# Patient Record
Sex: Male | Born: 1945 | Race: Asian | Hispanic: No | Marital: Married | Smoking: Former smoker
Health system: Southern US, Community
[De-identification: ages and names within clinical notes are randomized; demographics above are authoritative.]

## PROBLEM LIST (undated history)

## (undated) DIAGNOSIS — N390 Urinary tract infection, site not specified: Secondary | ICD-10-CM

## (undated) DIAGNOSIS — E119 Type 2 diabetes mellitus without complications: Secondary | ICD-10-CM

## (undated) DIAGNOSIS — I10 Essential (primary) hypertension: Secondary | ICD-10-CM

## (undated) DIAGNOSIS — E785 Hyperlipidemia, unspecified: Secondary | ICD-10-CM

## (undated) DIAGNOSIS — N400 Enlarged prostate without lower urinary tract symptoms: Secondary | ICD-10-CM

## (undated) HISTORY — DX: Essential (primary) hypertension: I10

## (undated) HISTORY — PX: OTHER SURGICAL HISTORY: SHX169

## (undated) HISTORY — PX: HERNIA REPAIR: SHX51

## (undated) HISTORY — DX: Urinary tract infection, site not specified: N39.0

## (undated) HISTORY — DX: Benign prostatic hyperplasia without lower urinary tract symptoms: N40.0

## (undated) HISTORY — DX: Hyperlipidemia, unspecified: E78.5

## (undated) HISTORY — DX: Type 2 diabetes mellitus without complications: E11.9

## (undated) SURGERY — ERCP, WITH INTERVENTION IF INDICATED
Anesthesia: Monitor Anesthesia Care | Laterality: Left

---

## 1999-12-24 ENCOUNTER — Inpatient Hospital Stay (HOSPITAL_COMMUNITY): Admission: EM | Admit: 1999-12-24 | Discharge: 1999-12-25 | Payer: Self-pay

## 2000-04-03 ENCOUNTER — Ambulatory Visit (HOSPITAL_COMMUNITY): Admission: RE | Admit: 2000-04-03 | Discharge: 2000-04-03 | Payer: Self-pay | Admitting: Gastroenterology

## 2005-02-24 ENCOUNTER — Encounter: Admission: RE | Admit: 2005-02-24 | Discharge: 2005-02-24 | Payer: Self-pay | Admitting: Emergency Medicine

## 2008-05-26 ENCOUNTER — Emergency Department (HOSPITAL_COMMUNITY): Admission: EM | Admit: 2008-05-26 | Discharge: 2008-05-27 | Payer: Self-pay | Admitting: Emergency Medicine

## 2010-05-28 NOTE — Procedures (Signed)
[facility]  Patient:    Kenneth Little, Kenneth Little                              MRN: [id number] Proc. Date: 04/03/00 Adm. Date:  [id number] Attending:  Orland Mustard CC:         Oley Balm. Georgina Pillion, M.D.  [facility]   Procedure Report  PROCEDURE:  Colonoscopy.  ENDOSCOPIST:  Llana Aliment. Edwards, M.D.  MEDICATIONS:  Fentanyl 75 mcg, Versed 6 mg IV.  SCOPE:  Olympus adult video colonoscope.  INDICATIONS:  Rectal bleeding with a transfusion and apparently some injection of hemorrhoids.  DESCRIPTION OF PROCEDURE:  The procedure had been explained to the patient and consent obtained.  With the patient in the left lateral decubitus position, the Olympus adult video colonoscope was inserted and advanced under direct visualization.  The prep was quite good.  We were able to advance to the terminal ileum without difficulty.  The ileocecal valve as seen.  The terminal ileum was normal.   The scope was withdrawn.  There was pan diverticulosis throughout the entire colon including the right colon with scattered small diverticulum.  No large amount of diverticulum, no stricture, etc.  The scope was withdrawn into rectum.  Fairly large internal hemorrhoids were seen.  They were not actively bleeding.  ASSESSMENT: 1. Large internal hemorrhoids probably the source of rectal bleeding. 2. Pan diverticulosis.  PLAN:  We will given the hemorrhoid sheet and have the patient take fiber diet, etc., and call if bleeding continues.  The patient may need to have more injection therapy for his hemorrhoids. DD:  04/03/00 TD:  04/04/00 Job: [id number]

## 2011-10-20 ENCOUNTER — Ambulatory Visit: Payer: Self-pay | Admitting: Internal Medicine

## 2011-11-21 ENCOUNTER — Ambulatory Visit (INDEPENDENT_AMBULATORY_CARE_PROVIDER_SITE_OTHER): Payer: 59 | Admitting: Internal Medicine

## 2011-11-21 ENCOUNTER — Encounter: Payer: Self-pay | Admitting: Internal Medicine

## 2011-11-21 VITALS — BP 110/76 | HR 79 | Temp 97.8°F | Resp 16 | Ht 67.5 in | Wt 176.0 lb

## 2011-11-21 DIAGNOSIS — Z860100 Personal history of colon polyps, unspecified: Secondary | ICD-10-CM | POA: Insufficient documentation

## 2011-11-21 DIAGNOSIS — R972 Elevated prostate specific antigen [PSA]: Secondary | ICD-10-CM

## 2011-11-21 DIAGNOSIS — E119 Type 2 diabetes mellitus without complications: Secondary | ICD-10-CM

## 2011-11-21 DIAGNOSIS — I10 Essential (primary) hypertension: Secondary | ICD-10-CM

## 2011-11-21 DIAGNOSIS — E7800 Pure hypercholesterolemia, unspecified: Secondary | ICD-10-CM

## 2011-11-21 DIAGNOSIS — N400 Enlarged prostate without lower urinary tract symptoms: Secondary | ICD-10-CM | POA: Insufficient documentation

## 2011-11-21 DIAGNOSIS — Z0000 Encounter for general adult medical examination without abnormal findings: Secondary | ICD-10-CM

## 2011-11-21 NOTE — Progress Notes (Signed)
Subjective:    Patient ID: Kenneth Little, male    DOB: May 26, 1945, 66 y.o.   MRN: [id number]  HPI  66 year old patient who is seen today to establish with our practice. He is followed by endocrine for a type 2 diabetes which she has had for at least 15 years. He is on Lantus insulin as well as oral medications. He has treated hypertension and is also on atorvastatin for dyslipidemia. He is seen annually for eye examinations by Blima Ledger. His last colonoscopy was 3 years ago. He has a history of colonic polyps. He's also followed Dr. Annabell Howells for BPH and a history of elevated PSA. He has had a prostate biopsy in the past.   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 Nonsmoker. He does exercise regularly including [facility] [employer] twice [facility]  Past Medical History  Diagnosis Date  . Hypertension   . Hyperlipidemia   . UTI (lower urinary tract infection)   . Diabetes mellitus without complication     History   Social History  . Marital Status: Single    Spouse Name: N/A    Number of Children: N/A  . Years of Education: N/A   Occupational History  . Not on file.   Social History Main Topics  . Smoking status: Former Smoker    Types: Cigarettes    Quit date: 11/20/1988  . Smokeless tobacco: Not on file  . Alcohol Use: No  . Drug Use: No  . Sexually Active: Yes   Other Topics Concern  . Not on file   Social History Narrative  . No narrative on file    History reviewed. No pertinent past surgical history.  Family History  Problem Relation Age of Onset  . Cancer Mother     breast  . Hypertension Father   . Diabetes Brother     Not on File  Current Outpatient Prescriptions on File Prior to Visit  Medication Sig Dispense Refill  . atorvastatin (LIPITOR) 40 MG tablet Take 40 mg by mouth daily.      Marland Kitchen DIOVAN 320 MG tablet       . glipiZIDE (GLUCOTROL XL) 10 MG 24 hr tablet Take 10 mg by mouth daily.      .  indapamide (LOZOL) 2.5 MG tablet Take 2.5 mg by mouth every morning.      Marland Kitchen JANUVIA 100 MG tablet       . LANTUS 100 UNIT/ML injection       . metformin (FORTAMET) 1000 MG (OSM) 24 hr tablet Take 1,000 mg by mouth daily with breakfast.      . miglitol (GLYSET) 25 MG tablet Take 25 mg by mouth 3 (three) times daily with meals.      . pioglitazone (ACTOS) 45 MG tablet         BP 110/76  Pulse 79  Temp 97.8 F (36.6 C) (Oral)  Resp 16  Ht 5' 7.5" (1.715 m)  Wt 176 lb (79.833 kg)  BMI 27.16 kg/m2  SpO2 97%       Review of Systems  Constitutional: Negative for fever, chills, activity change, appetite change and fatigue.  HENT: Negative for hearing loss, ear pain, congestion, rhinorrhea, sneezing, mouth sores, trouble swallowing, neck pain, neck stiffness, dental problem, voice change, sinus pressure and tinnitus.   Eyes: Negative for photophobia, pain, redness and visual disturbance.  Respiratory: Negative for apnea, cough, choking, chest tightness,  shortness of breath and wheezing.   Cardiovascular: Negative for chest pain, palpitations and leg swelling.  Gastrointestinal: Negative for nausea, vomiting, abdominal pain, diarrhea, constipation, blood in stool, abdominal distention, anal bleeding and rectal pain.  Genitourinary: Negative for dysuria, urgency, frequency, hematuria, flank pain, decreased urine volume, discharge, penile swelling, scrotal swelling, difficulty urinating, genital sores and testicular pain.  Musculoskeletal: Negative for myalgias, back pain, joint swelling, arthralgias and gait problem.  Skin: Negative for color change, rash and wound.  Neurological: Negative for dizziness, tremors, seizures, syncope, facial asymmetry, speech difficulty, weakness, light-headedness, numbness and headaches.  Hematological: Negative for adenopathy. Does not bruise/bleed easily.  Psychiatric/Behavioral: Negative for suicidal ideas, hallucinations, behavioral problems, confusion,  sleep disturbance, self-injury, dysphoric mood, decreased concentration and agitation. The patient is not nervous/anxious.        Objective:   Physical Exam  Constitutional: He is oriented to person, place, and time. He appears well-developed.  HENT:  Head: Normocephalic.  Right Ear: External ear normal.  Left Ear: External ear normal.  Eyes: Conjunctivae normal and EOM are normal.  Neck: Normal range of motion.  Cardiovascular: Normal rate and normal heart sounds.   Pulmonary/Chest: Breath sounds normal.  Abdominal: Bowel sounds are normal.  Musculoskeletal: Normal range of motion. He exhibits no edema and no tenderness.  Neurological: He is alert and oriented to person, place, and time.  Psychiatric: He has a normal mood and affect. His behavior is normal.          Assessment & Plan:   Preventive health examination Diabetes mellitus. Followup endocrinology Hypertension stable Dyslipidemia History colonic polyps BPH with history of elevated PSA. Followup urology  Recheck 1 year

## 2011-11-21 NOTE — Patient Instructions (Addendum)
Limit your sodium (Salt) intake    It is important that you exercise regularly, at least 20 minutes 3 to 4 times per week.  If you develop chest pain or shortness of breath seek  medical attention.  You need to lose weight.  Consider a lower calorie diet and regular exercise.  Return in one year for follow-up    Please check your hemoglobin A1c every 3 months

## 2012-03-07 ENCOUNTER — Encounter (INDEPENDENT_AMBULATORY_CARE_PROVIDER_SITE_OTHER): Payer: Self-pay | Admitting: General Surgery

## 2012-03-07 ENCOUNTER — Ambulatory Visit (INDEPENDENT_AMBULATORY_CARE_PROVIDER_SITE_OTHER): Payer: 59 | Admitting: General Surgery

## 2012-03-07 VITALS — BP 120/66 | HR 64 | Temp 97.0°F | Resp 12 | Ht 67.0 in | Wt 177.2 lb

## 2012-03-07 DIAGNOSIS — K648 Other hemorrhoids: Secondary | ICD-10-CM | POA: Insufficient documentation

## 2012-03-07 MED ORDER — TAMSULOSIN HCL 0.4 MG PO CAPS: 0.4000 mg | ORAL_CAPSULE | Freq: Every day | ORAL | Status: DC

## 2012-03-07 NOTE — Patient Instructions (Addendum)
No aspirin or blood thinning products 5 days prior to surgery. Use one fleets enema per rectum the morning of surgery at least 2 hours prior to leaving the house.   HEMORRHOIDS   The rectum is the last few inches of your colon, and it naturally stretches to hold stool.  Hemorrhoidal piles are natural clusters of blood vessels that help the rectum stretch to hold stool and allow bowel movements to eliminate feces.  Hemorrhoids are abnormally swollen blood vessels in the rectum.  Too much pressure in the rectum causes hemorrhoids by forcing blood to stretch and bulge the walls of the veins, sometimes even rupturing them.  Hemorrhoids can become like varicose veins you might see on a person's legs. When bulging hemorrhoidal veins are irritated, they can swell, burn, itch, become very painful, and bleed. Once the rectal veins have been stretched out and hemorrhoids created, they are difficult to get rid of completely and tend to recur with less straining than it took to cause them in the first place. Fortunately, good habits and simple medical treatment usually control hemorrhoids well, and surgery is only recommended in unusually severe cases. Some of the most frequent causes of hemorrhoids:    Constant sitting    Straining with bowel movements (from constipation or hard stools)    Diarrhea    Sitting on the toilet for a long time    Severe coughing    Childbirth    Heavy Lifting  Types of Hemorrhoids:    Internal hemorrhoids usually don't hurt or itch; they are deep inside the rectum and usually have no sensation. However, internal hemorrhoids can bleed.  Such bleeding should not be ignored and mask blood from a dangerous source like colorectal cancer, so persistent rectal bleeding should be investigated with a colonoscopy.    External hemorrhoids cause most of the symptoms - pain, burning, and itching. Unirritated hemorrhoids can look like small skin tags coming out of the anus.     Thrombosed  hemorrhoids can form when a hemorrhoid blood vessel bursts and causes the hemorrhoid to swell.  A purple blood clot can form in it and become an excruciatingly painful lump at the anus. Because of these unpleasant symptoms, immediate incision and drainage by a surgeon at an office visit can provide much relief of the pain.    PREVENTION Avoiding the causes listed in above will prevent most cases of hemorrhoids, but this advice is sometimes hard to follow:  How can you avoid sitting all day if you have a seated job? Also, we try to avoid coughing and diarrhea, but sometimes it's beyond your control.  Still, there are some practical hints to help:    If your main job activity is seated, always stand or walk during your breaks. Make it a point to stand and walk at least 5 minutes every hour and try to shift frequently in your chair to avoid direct rectal pressure.    Always exhale as you strain or lift. Don't hold your breath.    Treat coughing, diarrhea and constipation early since irritated hemorrhoids may soon follow.    Do not delay or try to prevent a bowel movement when the urge is present.   Exercise regularly (walking or jogging 60 minutes a day) to stimulate the bowels to move.   Avoid dry toilet paper when cleaning after bowel movements.  Moistened tissues such as baby wipes are less irritating.  Lightly pat the rectal area dry.  Using irrigating showers or  bottle irrigation washing can more gently clean this sensitive area.   Keep the anal and genital area clean and  dry.  Talcum or baby powders can help   GET YOUR STOOLS SOFT.   This is the most important way to prevent irritated hemorrhoids.  Hard stools are like sandpaper to the anorectal canal and will cause more problems.   The goal: ONE SOFT BOWEL MOVEMENT A DAY!  To have soft, regular bowel movements:    Drink at least 8 tall glasses of water a day.     AVOID CONSTIPATION    Take plenty of fiber.  Fiber is the undigested part of plant  food that passes into the colon, acting s "natures broom" to encourage bowel motility and movement.  Fiber can absorb and hold large amounts of water. This results in a larger, bulkier stool, which is soft and easier to pass. Work gradually over several weeks up to 6 servings a day of fiber (25g a day even more if needed) in the form of: o Vegetables -- Root (potatoes, carrots, turnips), leafy green (lettuce, salad greens, celery, spinach), or cooked high residue (cabbage, broccoli, etc) o Fruit -- Fresh (unpeeled skin & pulp), Dried (prunes, apricots, cherries, etc ),  or stewed ( applesauce)  o Whole grain breads, pasta, etc (whole wheat)  o Bran cereals    Bulking Agents -- This type of water-retaining fiber generally is easily obtained each day by one of the following:  o Psyllium bran -- The psyllium plant is remarkable because its ground seeds can retain so much water. This product is available as Metamucil, Konsyl, Effersyllium, Per Diem Fiber, or the less expensive generic preparation in drug and health food stores. Although labeled a laxative, it really is not a laxative.  o Methylcellulose -- This is another fiber derived from wood which also retains water. It is available as Citrucel. o Polyethylene Glycol - and "artificial" fiber commonly called Miralax or Glycolax.  It is helpful for people with gassy or bloated feelings with regular fiber o Flax Seed - a less gassy fiber than psyllium   No reading or other relaxing activity while on the toilet. If bowel movements take longer than 5 minutes, you are too constipated   Laxatives can be useful for a short period if constipation is severe o Osmotics (Milk of Magnesia, Fleets phosphosoda, Magnesium citrate, MiraLax, GoLytely) are safer than  o Stimulants (Senokot, Castor Oil, Dulcolax, Ex Lax)    o Do not take laxatives for more than 7days in a row.   Laxatives are not a good long-term solution as it can stress the intestine and colon and  causes too much mineral and fluid losses.    If badly constipated, try a Bowel Retraining Program: o Do not use laxatives.  o Eat a diet high in roughage, such as bran cereals and leafy vegetables.  o Drink six (6) ounces of prune or apricot juice each morning.  o Eat two (2) large servings of stewed fruit each day.  o Take one (1) heaping dose of a bulking agent (ex. Metamucil, Citrucel, Miralax) twice a day.  o Use sugar-free sweetener when possible to avoid excessive calories.  o Eat a normal breakfast.  o Set aside 15 minutes after breakfast to sit on the toilet, but do not strain to have a bowel movement.  o If you do not have a bowel movement by the third day, use an enema and repeat the above steps.  AVOID DIARRHEA o Switch to liquids and simpler foods for a few days to avoid stressing your intestines further. o Avoid dairy products (especially milk & ice cream) for a short time.  The intestines often can lose the ability to digest lactose when stressed. o Avoid foods that cause gassiness or bloating.  Typical foods include beans and other legumes, cabbage, broccoli, and dairy foods.  Every person has some sensitivity to other foods, so listen to our body and avoid those foods that trigger problems for you. o Adding fiber (Citrucel, Metamucil, psyllium, Miralax) gradually can help thicken stools by absorbing excess fluid and retrain the intestines to act more normally.  Slowly increase the dose over a few weeks.  Too much fiber too soon can backfire and cause cramping & bloating. o Probiotics (such as active yogurt, Align, etc) may help repopulate the intestines and colon with normal bacteria and calm down a sensitive digestive tract.  Most studies show it to be of mild help, though, and such products can be costly. o Medicines:   Bismuth subsalicylate (ex. Kayopectate, Pepto Bismol) every 30 minutes for up to 6 doses can help control diarrhea.  Avoid if pregnant.   Loperamide (Immodium)  can slow down diarrhea.  Start with two tablets (4mg  total) first and then try one tablet every 6 hours.  Avoid if you are having fevers or severe pain.  If you are not better or start feeling worse, stop all medicines and call your doctor for advice o Call your doctor if you are getting worse or not better.  Sometimes further testing (cultures, endoscopy, X-ray studies, bloodwork, etc) may be needed to help diagnose and treat the cause of the diarrhea.   If these preventive measures fail, you must take action right away! Hemorrhoids are one condition that can be mild in the morning and become intolerable by nightfall.

## 2012-03-07 NOTE — Progress Notes (Signed)
Patient ID: Kenneth Little, male   DOB: August 22, 1945, 67 y.o.   MRN: [id number]  Chief Complaint  Patient presents with  . New Evaluation    eval hems    HPI Kenneth Little is a 67 y.o. male.   HPI 67 year old Asian male comes in complaining of bleeding hemorrhoids. He is a former patient of Dr. Jinny Sanders. He states he hasn't had problems for a long time. He states that he started having bleeding after having bowel movements about 10 days. He states the blood is mainly in the commode. It is bright red. He denies any hemorrhoids protruding out. He denies any pain with defecation. He has 2 bowel movements a day. He denies any straining. He does not sit and read on the commode. He denies any itching or burning. He states that he drinks multiple glasses of water a day. He also has a diet that is high in fiber with fruits and vegetables. He denies any incontinence. He states that he has had several hemorrhoidal procedures in the past but they were done in the office. It sounds like he had sclerotherapy in the office. He said he had one procedure done in the office where he started bleeding quite profusely and had to be transferred and placed in the hospital and receive a blood transfusion. He has a colonoscopy every 5 years. His last one was 2-3 years ago he states. He says he just has benign polyps. He denies any family history of colon cancer.  Past Medical History  Diagnosis Date  . Hypertension   . Hyperlipidemia   . UTI (lower urinary tract infection)   . Diabetes mellitus without complication     History reviewed. No pertinent past surgical history.  Family History  Problem Relation Age of Onset  . Cancer Mother     breast  . Hypertension Father   . Diabetes Brother     Social History History  Substance Use Topics  . Smoking status: Former Smoker    Types: Cigarettes    Quit date: 11/20/1988  . Smokeless tobacco: Not on file  . Alcohol Use: No    No Known Allergies  Current Outpatient  Prescriptions  Medication Sig Dispense Refill  . aspirin 81 MG tablet Take 81 mg by mouth daily.      Marland Kitchen atorvastatin (LIPITOR) 40 MG tablet Take 40 mg by mouth daily.      Marland Kitchen DIOVAN 320 MG tablet       . ergocalciferol (VITAMIN D2) 50000 UNITS capsule Take 50,000 Units by mouth once a week.      Marland Kitchen glipiZIDE (GLUCOTROL XL) 10 MG 24 hr tablet Take 10 mg by mouth daily.      . indapamide (LOZOL) 2.5 MG tablet Take 2.5 mg by mouth every morning.      . INVOKANA 300 MG TABS       . JANUVIA 100 MG tablet       . LANTUS 100 UNIT/ML injection       . metformin (FORTAMET) 1000 MG (OSM) 24 hr tablet Take 1,000 mg by mouth daily with breakfast.      . miglitol (GLYSET) 25 MG tablet Take 25 mg by mouth 3 (three) times daily with meals.      . ONE TOUCH ULTRA TEST test strip       . pioglitazone (ACTOS) 45 MG tablet       . vitamin E 400 UNIT capsule Take 400 Units by mouth daily.      Marland Kitchen  tamsulosin (FLOMAX) 0.4 MG CAPS Take 1 capsule (0.4 mg total) by mouth daily. Start taking 2 days before hemorrhoid surgery  7 capsule  0   No current facility-administered medications for this visit.    Review of Systems Review of Systems  Constitutional: Negative for fever, chills, appetite change and unexpected weight change.       Plays basketball twice a week  HENT: Negative for congestion and trouble swallowing.   Eyes: Negative for visual disturbance.  Respiratory: Negative for chest tightness and shortness of breath.   Cardiovascular: Negative for chest pain and leg swelling.       No PND, no orthopnea, no DOE  Gastrointestinal:       See HPI  Genitourinary: Negative for dysuria and hematuria.       Some trouble starting stream. Some nocturia. Sees Dr Annabell Howells once a year for prostate  Musculoskeletal: Negative.   Skin: Negative for rash.  Neurological: Negative for seizures and speech difficulty.  Hematological: Does not bruise/bleed easily.  Psychiatric/Behavioral: Negative for behavioral problems and  confusion.    Blood pressure 120/66, pulse 64, temperature 97 F (36.1 C), temperature source Temporal, resp. rate 12, height 5\' 7"  (1.702 m), weight 177 lb 3.2 oz (80.377 kg).  Physical Exam Physical Exam  Vitals reviewed. Constitutional: He is oriented to person, place, and time. He appears well-developed and well-nourished. No distress.  HENT:  Head: Normocephalic and atraumatic.  Right Ear: External ear normal.  Left Ear: External ear normal.  Eyes: Conjunctivae are normal. No scleral icterus.  Neck: Neck supple. No tracheal deviation present. No thyromegaly present.  Cardiovascular: Normal rate, regular rhythm and normal heart sounds.   Pulmonary/Chest: Effort normal and breath sounds normal. No respiratory distress. He has no wheezes. He has no rales.  Abdominal: Soft. He exhibits no distension. There is no tenderness. There is no rebound and no guarding.  Genitourinary: Rectal exam shows no fissure and anal tone normal.     Old rt posterolateral incision,. Small left ant ext hemorrhoid - not engorged. A little prostate hypertrophy but no masses. Anoscopy showed large rt ant and post internal hemorrhoid; small left lateral int hemorrhoid  Musculoskeletal: He exhibits no edema and no tenderness.  Neurological: He is alert and oriented to person, place, and time.  Skin: Skin is warm and dry. No rash noted. He is not diaphoretic. No erythema. No pallor.  Psychiatric: He has a normal mood and affect. His behavior is normal. Judgment and thought content normal.    Data Reviewed Dr Charm Rings note  Assessment    Bleeding 3 column internal hemorrhoids DM HTN     Plan    We discussed the etiology of hemorrhoids. The patient was given educational material as well as diagrams. We discussed nonoperative and operative management of hemorrhoidal disease.  We discussed the importance of having a daily soft bowel movement and avoiding constipation. We also discussed good bowel habits such  as not reading in the bathroom, not straining, and drinking 6-8 glasses of water per day. We also discussed the importance of a high fiber diet. We discussed foods that were high in fiber as well as fiber supplements. We discussed the importance of trying to get 25-30 g of fiber per day in their diet. We discussed the need to start with a low dose of fiber and then gradually increasing their daily fiber dose over several weeks in order to avoid bloating and cramping.  We then discussed different surgical techniques for hemorrhoids,  specifically hemorrhoidal banding and excisional hemorrhoidectomy.  PLAN: Since the patient has excellent bowel habits, we discussed proceeding to the operating room for an exam under anesthesia, excisional hemorrhoidectomy and possible hemorrhoidal banding. It appears that only one hemorrhoid may be amenable to banding. Given the size of his internal hemorrhoids I do not believe sclerotherapy will be of much benefit  I discussed the procedure in detail.  The patient was given Agricultural engineer.  We discussed the risks and benefits of surgery including, but not limited to bleeding, infection, blood clot formation, anesthesia risk, urinary retention, hemorrhoid recurrence, injury to the sphincters resulting in incontinence, and the rare possibility of anal canal narrowing. I explained that the likelihood of improvement of their symptoms is good  We discussed the typical postoperative course.  I stressed the importance of not becoming constipated after surgery.  The patient was encouraged to limit pain medication if possible as this increases the likelihood of becoming constipated. The patient was advised to take stool softners & drink 8-10 glasses of non-carbonated, non-alcoholic beverages per day and to eat a high fiber diet.  I also encouraged soaking in a water warm bath for 15 minutes at a time several times a day and after a bowel movement.  The patient was advised to take  laxatives such as milk of magnesia or Miralax if no bowel movement three days after surgery.  The patient was advised to expect some blood tinged drainage as well as some blood in their bowel movements.    He would like to schedule surgery as soon as possible.  He will be given a fleets enema the morning of surgery. I will also place him on perioperative Flomax to decrease his risk of postoperative urinary retention  Mary Sella. Andrey Campanile, MD, FACS General, Bariatric, & Minimally Invasive Surgery [facility], [place]        [facility] M 03/07/2012, 10:19 AM

## 2012-03-20 ENCOUNTER — Other Ambulatory Visit (INDEPENDENT_AMBULATORY_CARE_PROVIDER_SITE_OTHER): Payer: Self-pay | Admitting: General Surgery

## 2012-03-20 DIAGNOSIS — K644 Residual hemorrhoidal skin tags: Secondary | ICD-10-CM

## 2012-03-20 DIAGNOSIS — K648 Other hemorrhoids: Secondary | ICD-10-CM

## 2012-03-20 HISTORY — PX: HEMORRHOID SURGERY: SHX153

## 2012-03-24 ENCOUNTER — Emergency Department (HOSPITAL_COMMUNITY)
Admission: EM | Admit: 2012-03-24 | Discharge: 2012-03-25 | Disposition: A | Discharge status: Home / Self Care | Payer: 59 | Attending: Emergency Medicine | Admitting: Emergency Medicine

## 2012-03-24 DIAGNOSIS — Z79899 Other long term (current) drug therapy: Secondary | ICD-10-CM | POA: Insufficient documentation

## 2012-03-24 DIAGNOSIS — Z87891 Personal history of nicotine dependence: Secondary | ICD-10-CM | POA: Insufficient documentation

## 2012-03-24 DIAGNOSIS — R5082 Postprocedural fever: Secondary | ICD-10-CM | POA: Insufficient documentation

## 2012-03-24 DIAGNOSIS — I10 Essential (primary) hypertension: Secondary | ICD-10-CM | POA: Insufficient documentation

## 2012-03-24 DIAGNOSIS — E785 Hyperlipidemia, unspecified: Secondary | ICD-10-CM | POA: Insufficient documentation

## 2012-03-24 DIAGNOSIS — R05 Cough: Secondary | ICD-10-CM | POA: Insufficient documentation

## 2012-03-24 DIAGNOSIS — R509 Fever, unspecified: Secondary | ICD-10-CM

## 2012-03-24 DIAGNOSIS — E119 Type 2 diabetes mellitus without complications: Secondary | ICD-10-CM | POA: Insufficient documentation

## 2012-03-24 DIAGNOSIS — Z9889 Other specified postprocedural states: Secondary | ICD-10-CM | POA: Insufficient documentation

## 2012-03-24 DIAGNOSIS — R059 Cough, unspecified: Secondary | ICD-10-CM | POA: Insufficient documentation

## 2012-03-24 DIAGNOSIS — Z7982 Long term (current) use of aspirin: Secondary | ICD-10-CM | POA: Insufficient documentation

## 2012-03-24 DIAGNOSIS — Z87440 Personal history of urinary (tract) infections: Secondary | ICD-10-CM | POA: Insufficient documentation

## 2012-03-25 ENCOUNTER — Emergency Department (HOSPITAL_COMMUNITY): Payer: 59

## 2012-03-25 ENCOUNTER — Telehealth (INDEPENDENT_AMBULATORY_CARE_PROVIDER_SITE_OTHER): Payer: Self-pay | Admitting: General Surgery

## 2012-03-25 ENCOUNTER — Encounter (HOSPITAL_COMMUNITY): Payer: Self-pay | Admitting: Emergency Medicine

## 2012-03-25 LAB — COMPREHENSIVE METABOLIC PANEL
AST: 63 U/L — ABNORMAL HIGH (ref 0–37)
Albumin: 3 g/dL — ABNORMAL LOW (ref 3.5–5.2)
Alkaline Phosphatase: 157 U/L — ABNORMAL HIGH (ref 39–117)
GFR calc Af Amer: 81 mL/min — ABNORMAL LOW (ref >90)
GFR calc non Af Amer: 70 mL/min — ABNORMAL LOW (ref >90)
Glucose, Bld: 229 mg/dL — ABNORMAL HIGH (ref 70–99)
Potassium: 3.3 mEq/L — ABNORMAL LOW (ref 3.5–5.1)
Total Protein: 6 g/dL (ref 6.0–8.3)

## 2012-03-25 LAB — URINE MICROSCOPIC-ADD ON: Urine-Other: NONE SEEN

## 2012-03-25 LAB — CBC
HCT: 37.6 % — ABNORMAL LOW (ref 39.0–52.0)
Hemoglobin: 12.9 g/dL — ABNORMAL LOW (ref 13.0–17.0)
MCH: 30.9 pg (ref 26.0–34.0)
MCHC: 34.3 g/dL (ref 30.0–36.0)
MCV: 90 fL (ref 78.0–100.0)
Platelets: 148 10*3/uL — ABNORMAL LOW (ref 150–400)
RBC: 4.18 MIL/uL — ABNORMAL LOW (ref 4.22–5.81)
RDW: 13 % (ref 11.5–15.5)
WBC: 6 10*3/uL (ref 4.0–10.5)

## 2012-03-25 LAB — URINALYSIS, ROUTINE W REFLEX MICROSCOPIC
Hgb urine dipstick: NEGATIVE
Leukocytes, UA: NEGATIVE
Specific Gravity, Urine: 1.039 — ABNORMAL HIGH (ref 1.005–1.030)

## 2012-03-25 MED ORDER — IBUPROFEN 200 MG PO TABS
600.0000 mg | ORAL_TABLET | Freq: Once | ORAL | Status: AC
  Administered 2012-03-25: 600 mg via ORAL
  Filled 2012-03-25: qty 3

## 2012-03-25 MED ORDER — SODIUM CHLORIDE 0.9 % IV SOLN
1000.0000 mL | Freq: Once | INTRAVENOUS | Status: AC
  Administered 2012-03-25: 1000 mL via INTRAVENOUS

## 2012-03-25 MED ORDER — IOHEXOL 350 MG/ML SOLN
100.0000 mL | Freq: Once | INTRAVENOUS | Status: AC | PRN
  Administered 2012-03-25: 100 mL via INTRAVENOUS

## 2012-03-25 MED ORDER — SODIUM CHLORIDE 0.9 % IV SOLN
1000.0000 mL | INTRAVENOUS | Status: DC
  Administered 2012-03-25: 1000 mL via INTRAVENOUS

## 2012-03-25 NOTE — ED Notes (Signed)
Patient transported to CT 

## 2012-03-25 NOTE — ED Notes (Signed)
MD at bedside. 

## 2012-03-25 NOTE — ED Notes (Addendum)
Patient post op for internal hemorrhoid surgery by Dr Gaynelle Adu on 03/20/12. No significant events following surgery until today when patient became lightheaded and febrile. At 0500 patient took his 5/325 percocet and colace, at 62 son arrived to patients home and found patient to be febrile. Patient son called surgeon, was told if fever continued to elevate or accompanied by a variety of symptoms to come to ER. At 1200 patient took 500mg  tylenol for temp 102.3 & recheck of 103.2, 1400 101.1, 1530 BM, sitz bath, 100.6, 1600 101.6 5/325 percocet, 1630 500mg  tylenol, 1800 colace pt reports temp 98.6, at 2330 104.2 & 105.5 on recheck. Patient showered, and was given 5/325 percocet and 500mg  Tylenol, FSBS 225. Patient denies taking his HS meds, including htn & dm meds.

## 2012-03-25 NOTE — ED Provider Notes (Signed)
History     CSN: [id number]  Arrival date & time 03/24/12  2357   First MD Initiated Contact with Patient 03/24/12 2342      Chief Complaint  Patient presents with  . Post-op Problem  . Fever    (Consider location/radiation/quality/duration/timing/severity/associated sxs/prior treatment) The history is provided by the patient (The patient's surgeon Dr. Andrey Campanile).   patient is currently 5 days out from a hemorrhoid surgery was performed in an outpatient surgical center by Dr. Andrey Campanile.  I was called by Dr. Andrey Campanile who informed me the patient was coming emergency apartment because of fevers of 204 105 today.  The patient reports mild cough but no shortness of breath.  He said no urinary symptoms.  He denies nausea vomiting and diarrhea.  He has no abdominal pain.  He reports that his perineum feels much better today than it did during the previous 2 days.  He denies new rash.  He has no urinary symptoms.  No melena or hematochezia.  He moved his bowels normally yesterday and a small amount today.  He denies sore throat.  No new rash.  Denies unilateral leg swelling.  No history of DVT or pulmonary embolism.  He is a diabetic reports his blood sugars have been in the mid-200s today.  This is high for this patient.  His family denies altered mental status.  No neck pain or neck stiffness.  No chest pain. Past Medical History  Diagnosis Date  . Hypertension   . Hyperlipidemia   . UTI (lower urinary tract infection)   . Diabetes mellitus without complication     Past Surgical History  Procedure Laterality Date  . Hemorrhoid surgery  03/20/12    internal    Family History  Problem Relation Age of Onset  . Cancer Mother     breast  . Hypertension Father   . Diabetes Brother     History  Substance Use Topics  . Smoking status: Former Smoker    Types: Cigarettes    Quit date: 11/20/1988  . Smokeless tobacco: Not on file  . Alcohol Use: No      Review of Systems  All other systems  reviewed and are negative.    Allergies  Review of patient's allergies indicates no known allergies.  Home Medications   Current Outpatient Rx  Name  Route  Sig  Dispense  Refill  . acetaminophen (TYLENOL) 500 MG tablet   Oral   Take 500 mg by mouth every 6 (six) hours as needed for pain. For pain         . aspirin 81 MG tablet   Oral   Take 81 mg by mouth every morning.          Marland Kitchen atorvastatin (LIPITOR) 40 MG tablet   Oral   Take 40 mg by mouth daily.         Marland Kitchen DIOVAN 320 MG tablet   Oral   Take 320 mg by mouth every evening.          . ergocalciferol (VITAMIN D2) 50000 UNITS capsule   Oral   Take 50,000 Units by mouth once a week. Thursday         . glipiZIDE (GLUCOTROL XL) 10 MG 24 hr tablet   Oral   Take 10 mg by mouth daily.         . indapamide (LOZOL) 2.5 MG tablet   Oral   Take 2.5 mg by mouth every morning.         Marland Kitchen  INVOKANA 300 MG TABS   Oral   Take 1 capsule by mouth daily.          Marland Kitchen JANUVIA 100 MG tablet   Oral   Take 100 mg by mouth daily.          Marland Kitchen LANTUS 100 UNIT/ML injection   Subcutaneous   Inject 20 Units into the skin every evening.          . metformin (FORTAMET) 1000 MG (OSM) 24 hr tablet   Oral   Take 1,000 mg by mouth daily with breakfast.         . oxyCODONE-acetaminophen (PERCOCET/ROXICET) 5-325 MG per tablet   Oral   Take 1 tablet by mouth every 4 (four) hours as needed for pain. For pain         . pioglitazone (ACTOS) 45 MG tablet   Oral   Take 45 mg by mouth daily.          . tamsulosin (FLOMAX) 0.4 MG CAPS   Oral   Take 1 capsule (0.4 mg total) by mouth daily. Start taking 2 days before hemorrhoid surgery   7 capsule   0   . vitamin E 400 UNIT capsule   Oral   Take 400 Units by mouth daily.           BP 109/50  Pulse 107  Temp(Src) 102.2 F (39 C) (Oral)  Resp 24  SpO2 92%  Physical Exam  Nursing note and vitals reviewed. Constitutional: He is oriented to person, place, and  time. He appears well-developed and well-nourished.  Non-toxic appearance. He does not have a sickly appearance. He does not appear ill.  HENT:  Head: Normocephalic and atraumatic.  Eyes: EOM are normal.  Neck: Normal range of motion.  Full range of motion of his neck.  No neck stiffness.  No meningeal signs.  Cardiovascular: Regular rhythm, normal heart sounds and intact distal pulses.   Tachycardia  Pulmonary/Chest: Effort normal and breath sounds normal. No respiratory distress. He has no rales.  Abdominal: Soft. He exhibits no distension. There is no tenderness.  Genitourinary:  Obvious recent surgical incision of his perineum.  No surrounding erythema.  No drainage.  No fluctuance or tenderness.  No crepitus.  No crepitus of the scrotum.  No drainage  Musculoskeletal: Normal range of motion.  Neurological: He is alert and oriented to person, place, and time.  Skin: Skin is warm and dry. No rash noted.  Psychiatric: He has a normal mood and affect. Judgment normal.    ED Course  Procedures (including critical care time)  Labs Reviewed  CBC - Abnormal; Notable for the following:    RBC 4.18 (*)    Hemoglobin 12.9 (*)    HCT 37.6 (*)    Platelets 148 (*)    All other components within normal limits  COMPREHENSIVE METABOLIC PANEL - Abnormal; Notable for the following:    Sodium 130 (*)    Potassium 3.3 (*)    Chloride 95 (*)    Glucose, Bld 229 (*)    Albumin 3.0 (*)    AST 63 (*)    ALT 86 (*)    Alkaline Phosphatase 157 (*)    GFR calc non Af Amer 70 (*)    GFR calc Af Amer 81 (*)    All other components within normal limits  URINALYSIS, ROUTINE W REFLEX MICROSCOPIC - Abnormal; Notable for the following:    APPearance CLOUDY (*)    Specific Gravity,  Urine 1.039 (*)    Glucose, UA >1000 (*)    All other components within normal limits  CULTURE, BLOOD (ROUTINE X 2)  CULTURE, BLOOD (ROUTINE X 2)  URINE MICROSCOPIC-ADD ON   Dg Chest 2 View  03/25/2012  *RADIOLOGY  REPORT*  Clinical Data: Postop 5 days.  Fever.  CHEST - 2 VIEW  Comparison: None.  Findings: Shallow inspiration.  Normal heart size and pulmonary vascularity.  Mild linear atelectasis in the lung bases.  No focal consolidation or airspace disease.  No blunting of costophrenic angles.  No pneumothorax.  Mediastinal contours appear intact. Calcification of the aorta.  IMPRESSION: Shallow inspiration with linear atelectasis in the lung bases.  No focal consolidation.   Original Report Authenticated By: Burman Nieves, M.D.    Ct Angio Chest W/cm &/or Wo Cm  03/25/2012  *RADIOLOGY REPORT*  Clinical Data: Postoperative fever and hypoxia.  CT ANGIOGRAPHY CHEST  Technique:  Multidetector CT imaging of the chest using the standard protocol during bolus administration of intravenous contrast. Multiplanar reconstructed images including MIPs were obtained and reviewed to evaluate the vascular anatomy.  Contrast: OMNIPAQUE IOHEXOL 350 MG/ML SOLN  Comparison: None.  Findings: Technically adequate study with good opacification of the central and segmental pulmonary arteries.  No focal filling defect. No evidence of significant pulmonary embolus.  Normal heart size.  Normal caliber thoracic aorta with calcification.  No significant lymphadenopathy in the chest.  No abnormal mediastinal fluid collections.  The esophagus is mostly decompressed.  Visualized portions of the upper abdominal organs are grossly unremarkable.  Mild atelectasis in the lung bases. Focal linear atelectasis in the right mid lung.  No pneumothorax. Mild emphysematous changes in the upper lungs.  No pleural effusions.  Normal alignment of the thoracic vertebrae.  IMPRESSION: No evidence of significant pulmonary embolus.  Mild atelectasis in the lungs.   Original Report Authenticated By: Burman Nieves, M.D.    I personally reviewed the imaging tests through PACS system I reviewed available ER/hospitalization records through the EMR   1. Fever        MDM  The patient looks her leg.  Initial chest x-ray demonstrated some atelectasis now for CT and it was performed.  CT angiogram no evidence of pulmonary embolism.  No evidence of pneumonia.  Home with an incentive spirometer.  Repeat abdominal exam is benign.  Surgical incisions look great.  Blood cultures are still pending.  The patient is overall well appearing and nontoxic appearing.  He is very reasonable and his family seems to take great care of him.  His family is extremely agitated.  The patient has resources and transportation.  In his family were encouraged to return to the ER for new or worsening symptoms.  I feel the patient is a great candidate for close outpatient observation.        Lyanne Co, MD 03/25/12 [phone]

## 2012-03-25 NOTE — Telephone Encounter (Signed)
Just called to check on pt after ER visit last night/early this am. Workup negative. LM on machine. Will contact tomorrow for phone f/u

## 2012-03-26 ENCOUNTER — Telehealth (INDEPENDENT_AMBULATORY_CARE_PROVIDER_SITE_OTHER): Payer: Self-pay | Admitting: General Surgery

## 2012-03-26 NOTE — Telephone Encounter (Signed)
Called to check on pt given high fevers over the weekend and ED visit. Says he is doing well. No n/v/abd pain/dysuria. No fevers. Temps 97-98. Having BMs. Advised to call if any issues before f/u appt.

## 2012-03-28 ENCOUNTER — Emergency Department (HOSPITAL_COMMUNITY): Payer: 59

## 2012-03-28 ENCOUNTER — Inpatient Hospital Stay (HOSPITAL_COMMUNITY)
Admission: EM | Admit: 2012-03-28 | Discharge: 2012-03-31 | DRG: 872 | Disposition: A | Discharge status: Home Health | Payer: 59 | Attending: Internal Medicine | Admitting: Internal Medicine

## 2012-03-28 ENCOUNTER — Inpatient Hospital Stay (HOSPITAL_COMMUNITY): Payer: 59

## 2012-03-28 ENCOUNTER — Encounter (HOSPITAL_COMMUNITY): Payer: Self-pay | Admitting: Emergency Medicine

## 2012-03-28 ENCOUNTER — Telehealth (INDEPENDENT_AMBULATORY_CARE_PROVIDER_SITE_OTHER): Payer: Self-pay | Admitting: *Deleted

## 2012-03-28 DIAGNOSIS — A419 Sepsis, unspecified organism: Secondary | ICD-10-CM

## 2012-03-28 DIAGNOSIS — Z87891 Personal history of nicotine dependence: Secondary | ICD-10-CM

## 2012-03-28 DIAGNOSIS — R7881 Bacteremia: Secondary | ICD-10-CM

## 2012-03-28 DIAGNOSIS — K759 Inflammatory liver disease, unspecified: Secondary | ICD-10-CM

## 2012-03-28 DIAGNOSIS — K648 Other hemorrhoids: Secondary | ICD-10-CM

## 2012-03-28 DIAGNOSIS — A4151 Sepsis due to Escherichia coli [E. coli]: Principal | ICD-10-CM | POA: Diagnosis present

## 2012-03-28 DIAGNOSIS — N400 Enlarged prostate without lower urinary tract symptoms: Secondary | ICD-10-CM

## 2012-03-28 DIAGNOSIS — R509 Fever, unspecified: Secondary | ICD-10-CM

## 2012-03-28 DIAGNOSIS — E7800 Pure hypercholesterolemia, unspecified: Secondary | ICD-10-CM

## 2012-03-28 DIAGNOSIS — E876 Hypokalemia: Secondary | ICD-10-CM | POA: Diagnosis present

## 2012-03-28 DIAGNOSIS — R7989 Other specified abnormal findings of blood chemistry: Secondary | ICD-10-CM

## 2012-03-28 DIAGNOSIS — E119 Type 2 diabetes mellitus without complications: Secondary | ICD-10-CM

## 2012-03-28 DIAGNOSIS — Z79899 Other long term (current) drug therapy: Secondary | ICD-10-CM

## 2012-03-28 DIAGNOSIS — Z860100 Personal history of colon polyps, unspecified: Secondary | ICD-10-CM

## 2012-03-28 DIAGNOSIS — Z8719 Personal history of other diseases of the digestive system: Secondary | ICD-10-CM

## 2012-03-28 DIAGNOSIS — D72819 Decreased white blood cell count, unspecified: Secondary | ICD-10-CM

## 2012-03-28 DIAGNOSIS — K819 Cholecystitis, unspecified: Secondary | ICD-10-CM | POA: Diagnosis present

## 2012-03-28 DIAGNOSIS — Z7982 Long term (current) use of aspirin: Secondary | ICD-10-CM

## 2012-03-28 DIAGNOSIS — I10 Essential (primary) hypertension: Secondary | ICD-10-CM

## 2012-03-28 DIAGNOSIS — E785 Hyperlipidemia, unspecified: Secondary | ICD-10-CM | POA: Diagnosis present

## 2012-03-28 LAB — BASIC METABOLIC PANEL
CO2: 28 mEq/L (ref 19–32)
Chloride: 95 mEq/L — ABNORMAL LOW (ref 96–112)
Creatinine, Ser: 0.99 mg/dL (ref 0.50–1.35)
Glucose, Bld: 204 mg/dL — ABNORMAL HIGH (ref 70–99)
Sodium: 137 mEq/L (ref 135–145)

## 2012-03-28 LAB — URINALYSIS, ROUTINE W REFLEX MICROSCOPIC
Glucose, UA: >1000 mg/dL — AB
Hgb urine dipstick: NEGATIVE
Leukocytes, UA: NEGATIVE
pH: 6 (ref 5.0–8.0)

## 2012-03-28 LAB — HEPATIC FUNCTION PANEL
AST: 252 U/L — ABNORMAL HIGH (ref 0–37)
Albumin: 3.1 g/dL — ABNORMAL LOW (ref 3.5–5.2)
Total Protein: 6.9 g/dL (ref 6.0–8.3)

## 2012-03-28 LAB — CBC WITH DIFFERENTIAL/PLATELET
Basophils Absolute: 0 10*3/uL (ref 0.0–0.1)
HCT: 38.5 % — ABNORMAL LOW (ref 39.0–52.0)
Lymphocytes Relative: 10 % — ABNORMAL LOW (ref 12–46)
Lymphs Abs: 0.2 10*3/uL — ABNORMAL LOW (ref 0.7–4.0)
MCV: 90.2 fL (ref 78.0–100.0)
Monocytes Absolute: 0 10*3/uL — ABNORMAL LOW (ref 0.1–1.0)
Neutro Abs: 2 10*3/uL (ref 1.7–7.7)
RBC: 4.27 MIL/uL (ref 4.22–5.81)
RDW: 13 % (ref 11.5–15.5)
WBC: 2.2 10*3/uL — ABNORMAL LOW (ref 4.0–10.5)

## 2012-03-28 LAB — GLUCOSE, CAPILLARY: Glucose-Capillary: 240 mg/dL — ABNORMAL HIGH (ref 70–99)

## 2012-03-28 LAB — CG4 I-STAT (LACTIC ACID): Lactic Acid, Venous: 2.64 mmol/L — ABNORMAL HIGH (ref 0.5–2.2)

## 2012-03-28 LAB — URINE MICROSCOPIC-ADD ON

## 2012-03-28 MED ORDER — INSULIN GLARGINE 100 UNIT/ML ~~LOC~~ SOLN
20.0000 [IU] | Freq: Every day | SUBCUTANEOUS | Status: DC
  Administered 2012-03-28 – 2012-03-30 (×3): 20 [IU] via SUBCUTANEOUS
  Filled 2012-03-28 (×4): qty 0.2

## 2012-03-28 MED ORDER — ASPIRIN EC 81 MG PO TBEC
81.0000 mg | DELAYED_RELEASE_TABLET | Freq: Every day | ORAL | Status: DC
  Administered 2012-03-29 – 2012-03-31 (×3): 81 mg via ORAL
  Filled 2012-03-28 (×3): qty 1

## 2012-03-28 MED ORDER — ACETAMINOPHEN 650 MG RE SUPP: 650.0000 mg | Freq: Four times a day (QID) | RECTAL | Status: DC | PRN

## 2012-03-28 MED ORDER — TAMSULOSIN HCL 0.4 MG PO CAPS
0.4000 mg | ORAL_CAPSULE | Freq: Every day | ORAL | Status: DC
  Administered 2012-03-29 – 2012-03-31 (×3): 0.4 mg via ORAL
  Filled 2012-03-28 (×3): qty 1

## 2012-03-28 MED ORDER — GLIPIZIDE ER 10 MG PO TB24
10.0000 mg | ORAL_TABLET | Freq: Every day | ORAL | Status: DC
Start: 2012-03-29 — End: 2012-03-31
  Administered 2012-03-30 – 2012-03-31 (×2): 10 mg via ORAL
  Filled 2012-03-28 (×5): qty 1

## 2012-03-28 MED ORDER — INSULIN ASPART 100 UNIT/ML ~~LOC~~ SOLN
0.0000 [IU] | Freq: Three times a day (TID) | SUBCUTANEOUS | Status: DC
  Administered 2012-03-29: 2 [IU] via SUBCUTANEOUS

## 2012-03-28 MED ORDER — LINAGLIPTIN 5 MG PO TABS
5.0000 mg | ORAL_TABLET | Freq: Every day | ORAL | Status: DC
  Administered 2012-03-31: 5 mg via ORAL
  Filled 2012-03-28 (×3): qty 1

## 2012-03-28 MED ORDER — ONDANSETRON HCL 4 MG/2ML IJ SOLN: 4.0000 mg | Freq: Four times a day (QID) | INTRAMUSCULAR | Status: DC | PRN

## 2012-03-28 MED ORDER — SODIUM CHLORIDE 0.9 % IV BOLUS (SEPSIS)
1000.0000 mL | Freq: Once | INTRAVENOUS | Status: AC
  Administered 2012-03-28: 1000 mL via INTRAVENOUS

## 2012-03-28 MED ORDER — ENOXAPARIN SODIUM 40 MG/0.4ML ~~LOC~~ SOLN
40.0000 mg | Freq: Every day | SUBCUTANEOUS | Status: DC
  Administered 2012-03-28 – 2012-03-30 (×3): 40 mg via SUBCUTANEOUS
  Filled 2012-03-28 (×4): qty 0.4

## 2012-03-28 MED ORDER — VANCOMYCIN HCL IN DEXTROSE 1-5 GM/200ML-% IV SOLN
1000.0000 mg | Freq: Once | INTRAVENOUS | Status: AC
  Administered 2012-03-29: 1000 mg via INTRAVENOUS
  Filled 2012-03-28 (×2): qty 200

## 2012-03-28 MED ORDER — PIPERACILLIN-TAZOBACTAM 3.375 G IVPB
3.3750 g | Freq: Once | INTRAVENOUS | Status: AC
  Administered 2012-03-28: 3.375 g via INTRAVENOUS
  Filled 2012-03-28: qty 50

## 2012-03-28 MED ORDER — ACETAMINOPHEN 325 MG PO TABS
650.0000 mg | ORAL_TABLET | Freq: Four times a day (QID) | ORAL | Status: DC | PRN
  Administered 2012-03-28: 650 mg via ORAL
  Filled 2012-03-28: qty 2

## 2012-03-28 MED ORDER — PIPERACILLIN-TAZOBACTAM 3.375 G IVPB
3.3750 g | Freq: Three times a day (TID) | INTRAVENOUS | Status: DC
  Administered 2012-03-29 – 2012-03-31 (×7): 3.375 g via INTRAVENOUS
  Filled 2012-03-28 (×8): qty 50

## 2012-03-28 MED ORDER — ACETAMINOPHEN 325 MG PO TABS
650.0000 mg | ORAL_TABLET | Freq: Four times a day (QID) | ORAL | Status: DC | PRN
  Administered 2012-03-29: 650 mg via ORAL
  Filled 2012-03-28: qty 2

## 2012-03-28 MED ORDER — IOHEXOL 300 MG/ML  SOLN
100.0000 mL | Freq: Once | INTRAMUSCULAR | Status: AC | PRN
  Administered 2012-03-28: 80 mL via INTRAVENOUS

## 2012-03-28 MED ORDER — SODIUM CHLORIDE 0.9 % IJ SOLN: 3.0000 mL | Freq: Two times a day (BID) | INTRAMUSCULAR | Status: DC

## 2012-03-28 MED ORDER — SODIUM CHLORIDE 0.9 % IV SOLN
INTRAVENOUS | Status: DC
  Administered 2012-03-28: 18:00:00 via INTRAVENOUS

## 2012-03-28 MED ORDER — SODIUM CHLORIDE 0.9 % IV SOLN
INTRAVENOUS | Status: AC
  Administered 2012-03-28: via INTRAVENOUS

## 2012-03-28 MED ORDER — ONDANSETRON HCL 4 MG PO TABS: 4.0000 mg | ORAL_TABLET | Freq: Four times a day (QID) | ORAL | Status: DC | PRN

## 2012-03-28 NOTE — ED Notes (Signed)
Patient transported to CT 

## 2012-03-28 NOTE — H&P (Addendum)
[facility] History and Physical  Kenneth Little:[id number] DOB: 1945/06/30 DOA: 03/28/2012  Referring physician: Dr. Lynelle Doctor. PCP: Rogelia Boga, MD  Specialists: None.  Chief Complaint: Fever and chills.  HPI: Kenneth Little is a 67 y.o. male with history of diabetes mellitus type 2, hypertension and hyperlipidemia who had hemorrhoidectomy last week started having fever and chills 4 days ago last Saturday and at that time had come to the ER. Workup including a CT angiogram of the chest was unremarkable and patient was discharged home. Patient since last evening started getting fever and chills again and today morning noticed drenching sweats and presented back to the ER. Did not have any nausea vomiting abdominal pain. Did have 2 episodes of diarrhea. Early in the day had some chest discomfort which was self-limiting and presently has no chest pain or shortness of breath. In the ER patient was initially found to be hypotensive and was given 2 L normal saline. Blood work showed elevated LFTs. Sonogram of the abdomen shows thickening of the gallbladder but no definite features of cholecystitis. Chest x-ray and UA are unremarkable. Blood cultures have been obtained and empiric antibiotics have been started. Patient will be admitted for possible sepsis with source not known.  Review of Systems: As presented in the history of presenting illness, rest negative.  Past Medical History  Diagnosis Date  . Hypertension   . Hyperlipidemia   . UTI (lower urinary tract infection)   . Diabetes mellitus without complication    Past Surgical History  Procedure Laterality Date  . Hemorrhoid surgery  03/20/12    internal  . Diverticulitis     Social History:  reports that he quit smoking about 23 years ago. His smoking use included Cigarettes. He smoked 0.00 packs per day. He does not have any smokeless tobacco history on file. He reports that he does not drink alcohol or use illicit drugs. Lives  at home. where does patient live-- Can do ADLs. Can patient participate in ADLs?  No Known Allergies  Family History  Problem Relation Age of Onset  . Cancer Mother     breast  . Hypertension Father   . Diabetes Brother       Prior to Admission medications   Medication Sig Start Date End Date Taking? Authorizing Provider  acetaminophen (TYLENOL) 500 MG tablet Take 500 mg by mouth every 6 (six) hours as needed for pain. For pain   Yes Historical Provider, MD  aspirin EC 81 MG tablet Take 81 mg by mouth daily.   Yes Historical Provider, MD  atorvastatin (LIPITOR) 40 MG tablet Take 40 mg by mouth daily.   Yes Historical Provider, MD  ergocalciferol (VITAMIN D2) 50000 UNITS capsule Take 50,000 Units by mouth once a week. Thursday   Yes Historical Provider, MD  glipiZIDE (GLUCOTROL XL) 10 MG 24 hr tablet Take 10 mg by mouth daily.   Yes Historical Provider, MD  indapamide (LOZOL) 2.5 MG tablet Take 2.5 mg by mouth every morning.   Yes Historical Provider, MD  insulin glargine (LANTUS) 100 UNIT/ML injection Inject 20 Units into the skin at bedtime.   Yes Historical Provider, MD  INVOKANA 300 MG TABS Take 1 capsule by mouth daily.  11/08/11  Yes Historical Provider, MD  metFORMIN (GLUMETZA) 1000 MG (MOD) 24 hr tablet Take 1,000 mg by mouth daily with breakfast.   Yes Historical Provider, MD  oxyCODONE-acetaminophen (PERCOCET/ROXICET) 5-325 MG per tablet Take 1 tablet by mouth every 4 (four) hours as  needed for pain. For pain   Yes Historical Provider, MD  pioglitazone (ACTOS) 45 MG tablet Take 45 mg by mouth daily.  09/05/11  Yes Historical Provider, MD  sitaGLIPtin (JANUVIA) 100 MG tablet Take 100 mg by mouth daily.   Yes Historical Provider, MD  tamsulosin (FLOMAX) 0.4 MG CAPS Take 1 capsule (0.4 mg total) by mouth daily. Start taking 2 days before hemorrhoid surgery 03/07/12  Yes Atilano Ina, MD  valsartan (DIOVAN) 320 MG tablet Take 320 mg by mouth daily.   Yes Historical Provider, MD   vitamin E 400 UNIT capsule Take 400 Units by mouth daily.   Yes Historical Provider, MD   Physical Exam: Filed Vitals:   03/28/12 1435 03/28/12 1715 03/28/12 1813 03/28/12 1925  BP: 127/66 111/55 99/49 94/50   Pulse: 120  111 60  Temp: 102.3 F (39.1 C) 102.9 F (39.4 C) 102.9 F (39.4 C) 100.6 F (38.1 C)  TempSrc: Oral Oral Oral Oral  Resp: 20 27 22    SpO2: 93% 95% 95% 96%     General:  Well-developed well-nourished.  Eyes: Anicteric no pallor.  ENT: No discharge from the ears eyes nose and mouth. Right upper part of the upper lip looks discolored.  Neck: No mass felt.  Cardiovascular: S1-S2 heard.  Respiratory: No rhonchi no crepitations.  Abdomen: Soft nontender bowel sounds present.  Skin: No rash seen.  Musculoskeletal: No edema.  Psychiatric: Appears normal.  Neurologic: Alert awake oriented to time place and person. Moves all extremities.  Labs on Admission:  Basic Metabolic Panel:  Recent Labs Lab 03/25/12 0115 03/28/12 1600  NA 130* 137  K 3.3* 4.0  CL 95* 95*  CO2 27 28  GLUCOSE 229* 204*  BUN 22 21  CREATININE 1.09 0.99  CALCIUM 8.6 9.5   Liver Function Tests:  Recent Labs Lab 03/25/12 0115 03/28/12 1600  AST 63* 252*  ALT 86* 147*  ALKPHOS 157* 245*  BILITOT 0.9 1.7*  PROT 6.0 6.9  ALBUMIN 3.0* 3.1*    Recent Labs Lab 03/28/12 1600  LIPASE 66*   No results found for this basename: AMMONIA,  in the last 168 hours CBC:  Recent Labs Lab 03/25/12 0115 03/28/12 1600  WBC 6.0 2.2*  NEUTROABS  --  2.0  HGB 12.9* 13.1  HCT 37.6* 38.5*  MCV 90.0 90.2  PLT 148* 173   Cardiac Enzymes: No results found for this basename: CKTOTAL, CKMB, CKMBINDEX, TROPONINI,  in the last 168 hours  BNP (last 3 results) No results found for this basename: PROBNP,  in the last 8760 hours CBG:  Recent Labs Lab 03/28/12 1936  GLUCAP 240*    Radiological Exams on Admission: Dg Chest 2 View  03/28/2012  *RADIOLOGY REPORT*  Clinical  Data: Fever and chills.  CHEST - 2 VIEW  Comparison: Two-view chest 02/26/2012.  Findings: The heart size is normal.  The lung volumes are low. Minimal bibasilar atelectasis is stable.  Mild degenerative endplate change and exaggerated kyphosis is stable.  IMPRESSION:  1.  Low lung volumes. 2.  No acute cardiopulmonary disease.   Original Report Authenticated By: Marin Roberts, M.D.    US Abdomen Complete  03/28/2012  *RADIOLOGY REPORT*  Clinical Data:  67 year old male with elevated LFTs and fever. Recent hemorrhoid surgery.  ABDOMINAL ULTRASOUND COMPLETE  Comparison:  08/06/2010 CT  Findings:  Gallbladder:  Gallbladder wall thickening is present. There is no evidence of cholelithiasis, pericholecystic fluid or sonographic Murphy's sign.  Common Bile Duct:  There is  no evidence of intrahepatic or extrahepatic biliary dilation. The CBD measures 2.4 mm in greatest diameter.  Liver:  The liver is within normal limits in parenchymal echogenicity. No focal abnormalities are identified.  IVC:  Appears normal.  Pancreas:  Although the pancreas is difficult to visualize in its entirety, no focal pancreatic abnormality is identified.  Spleen:  Within normal limits in size and echotexture.  Right kidney:  The right kidney is normal in size and parenchymal echogenicity.  There is no evidence of solid mass, hydronephrosis or definite renal calculi.  The right kidney measures 11.5 cm.  Left kidney:  The left kidney is normal in size and parenchymal echogenicity.  There is no evidence of solid mass, hydronephrosis or definite renal calculi.   The left kidney measures 11.5 cm.  Abdominal Aorta:  No abdominal aortic aneurysm identified.  There is no evidence of ascites.  IMPRESSION: Gallbladder wall thickening without evidence of cholelithiasis or other signs of cholecystitis.  This wall thickening is likely related to hepatic dysfunction, but if there is strong clinical suspicion for acute cholecystitis, consider nuclear  medicine study.  No other significant abnormalities identified.  Liver appears unremarkable sonographically.   Original Report Authenticated By: Harmon Pier, M.D.     EKG: Independently reviewed. Normal sinus rhythm.  Assessment/Plan Principal Problem:   Sepsis Active Problems:   Diabetes mellitus   Hypercholesterolemia   Hypertension   Leucopenia   LFT elevation   1. Sepsis - suspect most likely source could be intra-abdominal. Blood cultures have been obtained. Empiric antibiotics including vancomycin and Zosyn has been started. Get CT abdomen pelvis. Check stool for C. difficile and also influenza panel. 2. Elevated LFTs - sonogram shows thickening of the gallbladder but no definite sign of cholecystitis. At this time HIDA scan and a CT abdomen has been ordered. Check acute hepatitis panel, INR and Tylenol levels. Closely follow LFTs. 3. Leukopenia - could be from sepsis. Neutrophil counts are more than 500. Check HIV and closely follow CBC with differential. 4. History of hypertension - since patient was mildly hypotensive initially, holding off antihypertensives. 5. Diabetes mellitus type 2 - continue Lantus and Glucotrol but will hold off metformin Actos and Invokna due to the elevated LFTs and dehydration and will closely followup of CBG. 6. Hyperlipidemia - since patient has elevated LFTs holding off statins for now. 7. Recent hemorrhoid surgery.    Code Status: Full code.  Family Communication: Patient's son at the bedside.  Disposition Plan: Admit to inpatient.    Bobbyjoe Pabst N. [facility] Pager [phone].  If 7PM-7AM, please contact night-coverage [email or web address] Password [facility] 03/28/2012, 10:49 PM Addendum - patient CT abdomen and pelvis results show gallbladder stone with pericholecystic fluid. I have consulted on call surgeon Dr. Maisie Fus who will be seeing patient in consult. Patient will be kept n.p.o. in anticipation of possible surgery. Continue  antibiotics.  Midge Minium

## 2012-03-28 NOTE — ED Notes (Addendum)
States that pt still is having intermittent fevers since surgery. States that pt is taking Tylenol for the fever. States that diarrhea started today. Has abd pain  Addendum: Per pt's daughter..... Pt had hemorrhoid (1 removed and 2 banded) surgery on 03/20/12.  Pt has been okay until Friday when rectal discomfort began.  Saturday, pt had a temp of 101 per pt's daughter; began taking Tylenol that day and then temp spiked to 104 at 11pm that same night.  Dr. Andrey Campanile notified and pt taken to [facility] ED.  Today, pt began having cramping and 2 episodes of liquidy diarrhea today.  Pt has been c/o of being very cold and shivering.  98.6 at 1:30pm today followed by temp of 99.9 at 2pm.

## 2012-03-28 NOTE — ED Notes (Signed)
Knapp, MD at bedside.  

## 2012-03-28 NOTE — ED Notes (Signed)
MD at bedside. 

## 2012-03-28 NOTE — ED Notes (Signed)
Pt satting around 85%.  Applied 2L and pt still in high 80's. Increased [place] to 3L-->90%

## 2012-03-28 NOTE — Telephone Encounter (Signed)
Daughter called in to state that beginning last night patient began having chills then today he has continued with the chills and began having diarrhea with GI upset.  Daughter is going to speak with patient to find out what his temperature is at this time.  Will advise daughter further with more information.

## 2012-03-28 NOTE — Telephone Encounter (Signed)
Daughter called back to state temp of 98.3F oral at this time.  Advised patient that the symptoms do not sound related to recent surgery so patient may want to follow up with PMD or if daughter is concerned she may want another ED work up.  Spoke to Derrell Lolling MD who agreed with this recommendation.  Daughter states she is going to call patient's PMD to ask there opinion but she is thinking about going back to the ED because the doctor Saturday mentioned PNA was a possibility.

## 2012-03-28 NOTE — ED Notes (Signed)
Patient transported to X-ray 

## 2012-03-28 NOTE — ED Provider Notes (Addendum)
History    CSN: [id number] Arrival date & time 03/28/12  1428 First MD Initiated Contact with Patient 03/28/12 1525      Chief Complaint  Patient presents with  . Fever  . Chills    HPI Pt had internal hemorrhoid surgery last week.  He started having fevers over the weekend to 104.  He was seen in the ED and had several tests performed without andy definite findings per family.  The fever continued over the weekend but seemed to resolve on Monday and Tuesday.  Early this AM  He began having a fever again.    Family called the surgeon who instructed him to come back to the ED.  He has been having intermittent chills and fevers.  No vomiting but two episodes of loose stools.  No coughing.  No trouble urinating. No increasing pain in the rectal area. Past Medical History  Diagnosis Date  . Hypertension   . Hyperlipidemia   . UTI (lower urinary tract infection)   . Diabetes mellitus without complication     Past Surgical History  Procedure Laterality Date  . Hemorrhoid surgery  03/20/12    internal    Family History  Problem Relation Age of Onset  . Cancer Mother     breast  . Hypertension Father   . Diabetes Brother     History  Substance Use Topics  . Smoking status: Former Smoker    Types: Cigarettes    Quit date: 11/20/1988  . Smokeless tobacco: Not on file  . Alcohol Use: No      Review of Systems  Constitutional: Positive for fever.  Respiratory: Negative for cough.   Genitourinary: Negative for dysuria.  All other systems reviewed and are negative.    Allergies  Review of patient's allergies indicates no known allergies.  Home Medications   Current Outpatient Rx  Name  Route  Sig  Dispense  Refill  . acetaminophen (TYLENOL) 500 MG tablet   Oral   Take 500 mg by mouth every 6 (six) hours as needed for pain. For pain         . aspirin EC 81 MG tablet   Oral   Take 81 mg by mouth daily.         Marland Kitchen atorvastatin (LIPITOR) 40 MG tablet   Oral    Take 40 mg by mouth daily.         . ergocalciferol (VITAMIN D2) 50000 UNITS capsule   Oral   Take 50,000 Units by mouth once a week. Thursday         . glipiZIDE (GLUCOTROL XL) 10 MG 24 hr tablet   Oral   Take 10 mg by mouth daily.         . indapamide (LOZOL) 2.5 MG tablet   Oral   Take 2.5 mg by mouth every morning.         . insulin glargine (LANTUS) 100 UNIT/ML injection   Subcutaneous   Inject 20 Units into the skin at bedtime.         . INVOKANA 300 MG TABS   Oral   Take 1 capsule by mouth daily.          . metFORMIN (GLUMETZA) 1000 MG (MOD) 24 hr tablet   Oral   Take 1,000 mg by mouth daily with breakfast.         . oxyCODONE-acetaminophen (PERCOCET/ROXICET) 5-325 MG per tablet   Oral   Take 1 tablet by mouth every  4 (four) hours as needed for pain. For pain         . pioglitazone (ACTOS) 45 MG tablet   Oral   Take 45 mg by mouth daily.          . sitaGLIPtin (JANUVIA) 100 MG tablet   Oral   Take 100 mg by mouth daily.         . tamsulosin (FLOMAX) 0.4 MG CAPS   Oral   Take 1 capsule (0.4 mg total) by mouth daily. Start taking 2 days before hemorrhoid surgery   7 capsule   0   . valsartan (DIOVAN) 320 MG tablet   Oral   Take 320 mg by mouth daily.         . vitamin E 400 UNIT capsule   Oral   Take 400 Units by mouth daily.           BP 94/50  Pulse 60  Temp(Src) 100.6 F (38.1 C) (Oral)  Resp 22  SpO2 96%  Physical Exam  Nursing note and vitals reviewed. Constitutional: He appears well-developed and well-nourished. No distress.  HENT:  Head: Normocephalic and atraumatic.  Right Ear: External ear normal.  Left Ear: External ear normal.  Eyes: Conjunctivae are normal. Right eye exhibits no discharge. Left eye exhibits no discharge. No scleral icterus.  Neck: Neck supple. No tracheal deviation present.  Cardiovascular: Regular rhythm and intact distal pulses.  Tachycardia present.   Pulmonary/Chest: Effort normal and  breath sounds normal. No stridor. No respiratory distress. He has no wheezes. He has no rales.  Abdominal: Soft. Bowel sounds are normal. He exhibits no distension. There is tenderness (mild epigastrum). There is no rebound and no guarding.  Genitourinary:  Anal area, with surgical wound, no erythema, no fluctuance, no tenderness  Musculoskeletal: He exhibits no edema and no tenderness.  Neurological: He is alert. He has normal strength. No sensory deficit. Cranial nerve deficit:  no gross defecits noted. He exhibits normal muscle tone. He displays no seizure activity. Coordination normal.  Skin: Skin is warm and dry. No rash noted. He is not diaphoretic.  Psychiatric: He has a normal mood and affect.    ED Course  Procedures (including critical care time) EKG Sinus tachycardia rate 108 Left anterior fascicular block Left axis deviation Normal intervals Normal ST-T waves No prior EKG for comparison  Medications  acetaminophen (TYLENOL) tablet 650 mg (650 mg Oral Given 03/28/12 1725)  0.9 %  sodium chloride infusion ( Intravenous New Bag/Given 03/28/12 1828)  sodium chloride 0.9 % bolus 1,000 mL (0 mLs Intravenous Stopped 03/28/12 1721)  sodium chloride 0.9 % bolus 1,000 mL (0 mLs Intravenous Stopped 03/28/12 1828)  piperacillin-tazobactam (ZOSYN) IVPB 3.375 g (0 g Intravenous Stopped 03/28/12 2141)   CRITICAL CARE Performed by: Linwood Dibbles R Total critical care time: 45 Critical care time was exclusive of separately billable procedures and treating other patients. Critical care was necessary to treat or prevent imminent or life-threatening deterioration. Critical care was time spent personally by me on the following activities: development of treatment plan with patient and/or surrogate as well as nursing, discussions with consultants, evaluation of patient's response to treatment, examination of patient, obtaining history from patient or surrogate, ordering and performing treatments and  interventions, ordering and review of laboratory studies, ordering and review of radiographic studies, pulse oximetry and re-evaluation of patient's condition.   Labs Reviewed  CBC WITH DIFFERENTIAL - Abnormal; Notable for the following:    WBC 2.2 (*)  HCT 38.5 (*)    Neutrophils Relative 88 (*)    Lymphocytes Relative 10 (*)    Lymphs Abs 0.2 (*)    Monocytes Relative 1 (*)    Monocytes Absolute 0.0 (*)    All other components within normal limits  BASIC METABOLIC PANEL - Abnormal; Notable for the following:    Chloride 95 (*)    Glucose, Bld 204 (*)    GFR calc non Af Amer 85 (*)    All other components within normal limits  URINALYSIS, ROUTINE W REFLEX MICROSCOPIC - Abnormal; Notable for the following:    Specific Gravity, Urine 1.031 (*)    Glucose, UA >1000 (*)    Ketones, ur 15 (*)    All other components within normal limits  HEPATIC FUNCTION PANEL - Abnormal; Notable for the following:    Albumin 3.1 (*)    AST 252 (*)    ALT 147 (*)    Alkaline Phosphatase 245 (*)    Total Bilirubin 1.7 (*)    Bilirubin, Direct 0.7 (*)    Indirect Bilirubin 1.0 (*)    All other components within normal limits  LIPASE, BLOOD - Abnormal; Notable for the following:    Lipase 66 (*)    All other components within normal limits  GLUCOSE, CAPILLARY - Abnormal; Notable for the following:    Glucose-Capillary 240 (*)    All other components within normal limits  CG4 I-STAT (LACTIC ACID) - Abnormal; Notable for the following:    Lactic Acid, Venous 2.64 (*)    All other components within normal limits  CULTURE, BLOOD (ROUTINE X 2)  CULTURE, BLOOD (ROUTINE X 2)  URINE MICROSCOPIC-ADD ON   Dg Chest 2 View  03/28/2012  *RADIOLOGY REPORT*  Clinical Data: Fever and chills.  CHEST - 2 VIEW  Comparison: Two-view chest 02/26/2012.  Findings: The heart size is normal.  The lung volumes are low. Minimal bibasilar atelectasis is stable.  Mild degenerative endplate change and exaggerated kyphosis  is stable.  IMPRESSION:  1.  Low lung volumes. 2.  No acute cardiopulmonary disease.   Original Report Authenticated By: Marin Roberts, M.D.    US Abdomen Complete  03/28/2012  *RADIOLOGY REPORT*  Clinical Data:  67 year old male with elevated LFTs and fever. Recent hemorrhoid surgery.  ABDOMINAL ULTRASOUND COMPLETE  Comparison:  08/06/2010 CT  Findings:  Gallbladder:  Gallbladder wall thickening is present. There is no evidence of cholelithiasis, pericholecystic fluid or sonographic Murphy's sign.  Common Bile Duct:  There is no evidence of intrahepatic or extrahepatic biliary dilation. The CBD measures 2.4 mm in greatest diameter.  Liver:  The liver is within normal limits in parenchymal echogenicity. No focal abnormalities are identified.  IVC:  Appears normal.  Pancreas:  Although the pancreas is difficult to visualize in its entirety, no focal pancreatic abnormality is identified.  Spleen:  Within normal limits in size and echotexture.  Right kidney:  The right kidney is normal in size and parenchymal echogenicity.  There is no evidence of solid mass, hydronephrosis or definite renal calculi.  The right kidney measures 11.5 cm.  Left kidney:  The left kidney is normal in size and parenchymal echogenicity.  There is no evidence of solid mass, hydronephrosis or definite renal calculi.   The left kidney measures 11.5 cm.  Abdominal Aorta:  No abdominal aortic aneurysm identified.  There is no evidence of ascites.  IMPRESSION: Gallbladder wall thickening without evidence of cholelithiasis or other signs of cholecystitis.  This wall  thickening is likely related to hepatic dysfunction, but if there is strong clinical suspicion for acute cholecystitis, consider nuclear medicine study.  No other significant abnormalities identified.  Liver appears unremarkable sonographically.   Original Report Authenticated By: Harmon Pier, M.D.      1. Hepatitis   2. Fever       MDM  Patient has elevation in his  liver function tests. His lipase is also slightly elevated.  His white blood cell count is also decreased.  This would be suggestive of some type of viral illness but certainly cannot exclude bacterial infection this time. Patient's lactic acid level is slightly increased at 2.64. We'll continue with IV fluid hydration. I will give him a dose of antibiotics empirically considering his borderline blood pressure, tachycardia and elevated lactic acid level. while we are waiting for his gallbladder ultrasound.  10:07 PM ultrasound does not show cholelithiasis there is some gallbladder wall thickening but overall I doubt cholecystitis. On repeat exam the patient is no tenderness in his right upper quadrant. It is possible that his symptoms may be related to a hepatitis. Considering his persistent fever, his borderline blood pressures, and elevated lactic acid level suggesting SIRS, early sepsis. I will consult with the medical service regarding admission for observation and further evaluation.       Celene Kras, MD 03/28/12 2225

## 2012-03-29 ENCOUNTER — Encounter (HOSPITAL_COMMUNITY): Payer: Self-pay

## 2012-03-29 ENCOUNTER — Inpatient Hospital Stay (HOSPITAL_COMMUNITY): Payer: 59

## 2012-03-29 DIAGNOSIS — R7989 Other specified abnormal findings of blood chemistry: Secondary | ICD-10-CM

## 2012-03-29 DIAGNOSIS — R509 Fever, unspecified: Secondary | ICD-10-CM

## 2012-03-29 LAB — HEPATIC FUNCTION PANEL
ALT: 358 U/L — ABNORMAL HIGH (ref 0–53)
AST: 360 U/L — ABNORMAL HIGH (ref 0–37)
Indirect Bilirubin: 0.5 mg/dL (ref 0.3–0.9)
Total Protein: 5.6 g/dL — ABNORMAL LOW (ref 6.0–8.3)

## 2012-03-29 LAB — CBC
Hemoglobin: 12.1 g/dL — ABNORMAL LOW (ref 13.0–17.0)
MCH: 30.2 pg (ref 26.0–34.0)
MCHC: 33.7 g/dL (ref 30.0–36.0)
MCV: 89.5 fL (ref 78.0–100.0)
RBC: 4.01 MIL/uL — ABNORMAL LOW (ref 4.22–5.81)

## 2012-03-29 LAB — GLUCOSE, CAPILLARY
Glucose-Capillary: 115 mg/dL — ABNORMAL HIGH (ref 70–99)
Glucose-Capillary: 82 mg/dL (ref 70–99)
Glucose-Capillary: 66 mg/dL — ABNORMAL LOW (ref 70–99)
Glucose-Capillary: 117 mg/dL — ABNORMAL HIGH (ref 70–99)
Glucose-Capillary: 76 mg/dL (ref 70–99)
Glucose-Capillary: 193 mg/dL — ABNORMAL HIGH (ref 70–99)

## 2012-03-29 LAB — BASIC METABOLIC PANEL
BUN: 18 mg/dL (ref 6–23)
CO2: 25 mEq/L (ref 19–32)
GFR calc non Af Amer: 77 mL/min — ABNORMAL LOW (ref >90)
Glucose, Bld: 89 mg/dL (ref 70–99)
Potassium: 3.1 mEq/L — ABNORMAL LOW (ref 3.5–5.1)
Sodium: 135 mEq/L (ref 135–145)

## 2012-03-29 LAB — INFLUENZA PANEL BY PCR (TYPE A & B): H1N1 flu by pcr: NOT DETECTED

## 2012-03-29 LAB — CBC WITH DIFFERENTIAL/PLATELET
Basophils Relative: 0 % (ref 0–1)
Eosinophils Absolute: 0 10*3/uL (ref 0.0–0.7)
HCT: 37.5 % — ABNORMAL LOW (ref 39.0–52.0)
Hemoglobin: 12.8 g/dL — ABNORMAL LOW (ref 13.0–17.0)
Lymphs Abs: 0.6 10*3/uL — ABNORMAL LOW (ref 0.7–4.0)
MCH: 30.5 pg (ref 26.0–34.0)
MCHC: 34.1 g/dL (ref 30.0–36.0)
Monocytes Absolute: 1 10*3/uL (ref 0.1–1.0)
Monocytes Relative: 10 % (ref 3–12)
Neutro Abs: 8.7 10*3/uL — ABNORMAL HIGH (ref 1.7–7.7)
RBC: 4.2 MIL/uL — ABNORMAL LOW (ref 4.22–5.81)

## 2012-03-29 LAB — MRSA PCR SCREENING: MRSA by PCR: NEGATIVE

## 2012-03-29 LAB — ACETAMINOPHEN LEVEL: Acetaminophen (Tylenol), Serum: <15 ug/mL (ref 10–30)

## 2012-03-29 LAB — PROCALCITONIN: Procalcitonin: 46.69 ng/mL

## 2012-03-29 LAB — PROTIME-INR: Prothrombin Time: 13 seconds (ref 11.6–15.2)

## 2012-03-29 LAB — HIV ANTIBODY (ROUTINE TESTING W REFLEX): HIV: NONREACTIVE

## 2012-03-29 LAB — CREATININE, SERUM: Creatinine, Ser: 1.22 mg/dL (ref 0.50–1.35)

## 2012-03-29 MED ORDER — VANCOMYCIN HCL IN DEXTROSE 1-5 GM/200ML-% IV SOLN
1000.0000 mg | Freq: Two times a day (BID) | INTRAVENOUS | Status: DC
  Administered 2012-03-29 – 2012-03-31 (×4): 1000 mg via INTRAVENOUS
  Filled 2012-03-29 (×5): qty 200

## 2012-03-29 MED ORDER — DEXTROSE 50 % IV SOLN
INTRAVENOUS | Status: AC
  Administered 2012-03-29: 25 mL
  Filled 2012-03-29: qty 50

## 2012-03-29 MED ORDER — TECHNETIUM TC 99M MEBROFENIN IV KIT
5.1000 | PACK | Freq: Once | INTRAVENOUS | Status: AC | PRN
  Administered 2012-03-29: 5 via INTRAVENOUS

## 2012-03-29 MED ORDER — POTASSIUM CHLORIDE CRYS ER 20 MEQ PO TBCR
40.0000 meq | EXTENDED_RELEASE_TABLET | Freq: Once | ORAL | Status: AC
  Administered 2012-03-29: 40 meq via ORAL
  Filled 2012-03-29: qty 2

## 2012-03-29 MED ORDER — POTASSIUM CHLORIDE 10 MEQ/100ML IV SOLN: 10.0000 meq | INTRAVENOUS | Status: DC

## 2012-03-29 MED ORDER — DEXTROSE 50 % IV SOLN: 25.0000 mL | Freq: Once | INTRAVENOUS | Status: AC | PRN

## 2012-03-29 NOTE — Progress Notes (Signed)
Inpatient Diabetes Program Recommendations  AACE/ADA: New Consensus Statement on Inpatient Glycemic Control (2013)  Target Ranges:  Prepandial:   less than 140 mg/dL      Peak postprandial:   less than 180 mg/dL (1-2 hours)      Critically ill patients:  140 - 180 mg/dL   Reason forAssessment: Hypoglycemia  Results for Klosinski, Siyon W (MRN [id number]) as of 03/29/2012 13:56  Ref. Range 03/28/2012 19:36 03/29/2012 03:20 03/29/2012 08:09 03/29/2012 10:11 03/29/2012 10:40  Glucose-Capillary Latest Range: 70-99 mg/dL 409 (H) 811 (H) 82 66 (L) 117 (H)    Inpatient Diabetes Program Recommendations Oral Agents: D/C glipizide until ready for discharge to prevent hypoglycemia  Note: Received home dose of Lantus 20 units QHS last night.  Will follow.

## 2012-03-29 NOTE — Progress Notes (Signed)
General Surgery [facility]  Patient seen and examined.  Studies reviewed.  Daughter at bedside.  Abdominal exam completely benign.  Patient taking liquids and just finished eating a sandwich while I was in the room!  Will discuss with Dr. Andrey Campanile.  Continue IV abx's and await lab work and culture results.  Will follow.  Velora Heckler, MD, [facility] Office: [phone]

## 2012-03-29 NOTE — Consult Note (Signed)
Patient seen, examined, and I agree with the above documentation, including the assessment and plan. Admitted with sepsis like picture and now GNR in 2/2 blood cultures, UA was neg for infection  Not had much abd pain, he describes as "discomfort" in epigastrium.  No n/v, good appetite GNR sepsis and LFTs argue for biliary pathology, but CBD is of normal size and I do no think this is cholangitis, may be cholecystitis, but HIDA showed thickened GB only No role for ERCP now. Acute Hep A and B still pending, but unlikely given bacteremia, Hep C and Hep B surf Ag neg Continue IV abx, await speciation of GNR Follow LFTs, which may be reactive in the setting of sepsis (but still favor GB etiology at this point)

## 2012-03-29 NOTE — H&P (Signed)
Kenneth Little is an 67 y.o. male.   Chief Complaint: fevers HPI: the pt is s/p hemorrhoidectomy last week.   He has been having intermittent fevers, associated with some vague abd pain and diarrhea.  He was sent to the ED by our office staff.  Initial workup shows elevated LFTs.  He has been tachycardic and slightly hypotensive. Past Medical History  Diagnosis Date  . Hypertension   . Hyperlipidemia   . UTI (lower urinary tract infection)   . Diabetes mellitus without complication     Past Surgical History  Procedure Laterality Date  . Hemorrhoid surgery  03/20/12    internal  . Diverticulitis      Family History  Problem Relation Age of Onset  . Cancer Mother     breast  . Hypertension Father   . Diabetes Brother    Social History:  reports that he quit smoking about 23 years ago. His smoking use included Cigarettes. He smoked 0.00 packs per day. He does not have any smokeless tobacco history on file. He reports that he does not drink alcohol or use illicit drugs.  Allergies: No Known Allergies  Medications Prior to Admission  Medication Sig Dispense Refill  . acetaminophen (TYLENOL) 500 MG tablet Take 500 mg by mouth every 6 (six) hours as needed for pain. For pain      . aspirin EC 81 MG tablet Take 81 mg by mouth daily.      Marland Kitchen atorvastatin (LIPITOR) 40 MG tablet Take 40 mg by mouth daily.      . ergocalciferol (VITAMIN D2) 50000 UNITS capsule Take 50,000 Units by mouth once a week. Thursday      . glipiZIDE (GLUCOTROL XL) 10 MG 24 hr tablet Take 10 mg by mouth daily.      . indapamide (LOZOL) 2.5 MG tablet Take 2.5 mg by mouth every morning.      . insulin glargine (LANTUS) 100 UNIT/ML injection Inject 20 Units into the skin at bedtime.      . INVOKANA 300 MG TABS Take 1 capsule by mouth daily.       . metFORMIN (GLUMETZA) 1000 MG (MOD) 24 hr tablet Take 1,000 mg by mouth daily with breakfast.      . oxyCODONE-acetaminophen (PERCOCET/ROXICET) 5-325 MG per tablet Take 1 tablet  by mouth every 4 (four) hours as needed for pain. For pain      . pioglitazone (ACTOS) 45 MG tablet Take 45 mg by mouth daily.       . sitaGLIPtin (JANUVIA) 100 MG tablet Take 100 mg by mouth daily.      . tamsulosin (FLOMAX) 0.4 MG CAPS Take 1 capsule (0.4 mg total) by mouth daily. Start taking 2 days before hemorrhoid surgery  7 capsule  0  . valsartan (DIOVAN) 320 MG tablet Take 320 mg by mouth daily.      . vitamin E 400 UNIT capsule Take 400 Units by mouth daily.        Results for orders placed during the hospital encounter of 03/28/12 (from the past 48 hour(s))  URINALYSIS, ROUTINE W REFLEX MICROSCOPIC     Status: Abnormal   Collection Time    03/28/12  3:55 PM      Result Value Range   Color, Urine YELLOW  YELLOW   APPearance CLEAR  CLEAR   Specific Gravity, Urine 1.031 (*) 1.005 - 1.030   pH 6.0  5.0 - 8.0   Glucose, UA >1000 (*) NEGATIVE mg/dL  Hgb urine dipstick NEGATIVE  NEGATIVE   Bilirubin Urine NEGATIVE  NEGATIVE   Ketones, ur 15 (*) NEGATIVE mg/dL   Protein, ur NEGATIVE  NEGATIVE mg/dL   Urobilinogen, UA 0.2  0.0 - 1.0 mg/dL   Nitrite NEGATIVE  NEGATIVE   Leukocytes, UA NEGATIVE  NEGATIVE  URINE MICROSCOPIC-ADD ON     Status: None   Collection Time    03/28/12  3:55 PM      Result Value Range   Squamous Epithelial / LPF RARE  RARE   RBC / HPF 0-2  <3 RBC/hpf  CBC WITH DIFFERENTIAL     Status: Abnormal   Collection Time    03/28/12  4:00 PM      Result Value Range   WBC 2.2 (*) 4.0 - 10.5 K/uL   RBC 4.27  4.22 - 5.81 MIL/uL   Hemoglobin 13.1  13.0 - 17.0 g/dL   HCT 08.6 (*) 57.8 - 46.9 %   MCV 90.2  78.0 - 100.0 fL   MCH 30.7  26.0 - 34.0 pg   MCHC 34.0  30.0 - 36.0 g/dL   RDW 62.9  52.8 - 41.3 %   Platelets 173  150 - 400 K/uL   Neutrophils Relative 88 (*) 43 - 77 %   Neutro Abs 2.0  1.7 - 7.7 K/uL   Lymphocytes Relative 10 (*) 12 - 46 %   Lymphs Abs 0.2 (*) 0.7 - 4.0 K/uL   Monocytes Relative 1 (*) 3 - 12 %   Monocytes Absolute 0.0 (*) 0.1 - 1.0 K/uL    Eosinophils Relative 1  0 - 5 %   Eosinophils Absolute 0.0  0.0 - 0.7 K/uL   Basophils Relative 0  0 - 1 %   Basophils Absolute 0.0  0.0 - 0.1 K/uL  BASIC METABOLIC PANEL     Status: Abnormal   Collection Time    03/28/12  4:00 PM      Result Value Range   Sodium 137  135 - 145 mEq/L   Potassium 4.0  3.5 - 5.1 mEq/L   Comment: SLIGHT HEMOLYSIS     HEMOLYSIS AT THIS LEVEL MAY AFFECT RESULT   Chloride 95 (*) 96 - 112 mEq/L   CO2 28  19 - 32 mEq/L   Glucose, Bld 204 (*) 70 - 99 mg/dL   BUN 21  6 - 23 mg/dL   Creatinine, Ser 2.44  0.50 - 1.35 mg/dL   Calcium 9.5  8.4 - 01.0 mg/dL   GFR calc non Af Amer 85 (*) >90 mL/min   GFR calc Af Amer >90  >90 mL/min   Comment:            The eGFR has been calculated     using the CKD EPI equation.     This calculation has not been     validated in all clinical     situations.     eGFR's persistently     <90 mL/min signify     possible Chronic Kidney Disease.  HEPATIC FUNCTION PANEL     Status: Abnormal   Collection Time    03/28/12  4:00 PM      Result Value Range   Total Protein 6.9  6.0 - 8.3 g/dL   Albumin 3.1 (*) 3.5 - 5.2 g/dL   AST 272 (*) 0 - 37 U/L   Comment: SLIGHT HEMOLYSIS   ALT 147 (*) 0 - 53 U/L   Comment: SLIGHT HEMOLYSIS   Alkaline  Phosphatase 245 (*) 39 - 117 U/L   Total Bilirubin 1.7 (*) 0.3 - 1.2 mg/dL   Comment: SLIGHT HEMOLYSIS   Bilirubin, Direct 0.7 (*) 0.0 - 0.3 mg/dL   Comment: SLIGHT HEMOLYSIS   Indirect Bilirubin 1.0 (*) 0.3 - 0.9 mg/dL  LIPASE, BLOOD     Status: Abnormal   Collection Time    03/28/12  4:00 PM      Result Value Range   Lipase 66 (*) 11 - 59 U/L  CG4 I-STAT (LACTIC ACID)     Status: Abnormal   Collection Time    03/28/12  4:14 PM      Result Value Range   Lactic Acid, Venous 2.64 (*) 0.5 - 2.2 mmol/L  GLUCOSE, CAPILLARY     Status: Abnormal   Collection Time    03/28/12  7:36 PM      Result Value Range   Glucose-Capillary 240 (*) 70 - 99 mg/dL  PROTIME-INR     Status: None    Collection Time    03/29/12 12:01 AM      Result Value Range   Prothrombin Time 13.0  11.6 - 15.2 seconds   INR 0.99  0.00 - 1.49  CBC     Status: Abnormal   Collection Time    03/29/12 12:01 AM      Result Value Range   WBC 10.4  4.0 - 10.5 K/uL   RBC 4.01 (*) 4.22 - 5.81 MIL/uL   Hemoglobin 12.1 (*) 13.0 - 17.0 g/dL   HCT 30.8 (*) 65.7 - 84.6 %   MCV 89.5  78.0 - 100.0 fL   MCH 30.2  26.0 - 34.0 pg   MCHC 33.7  30.0 - 36.0 g/dL   RDW 96.2  95.2 - 84.1 %   Platelets 153  150 - 400 K/uL   Dg Chest 2 View  03/28/2012  *RADIOLOGY REPORT*  Clinical Data: Fever and chills.  CHEST - 2 VIEW  Comparison: Two-view chest 02/26/2012.  Findings: The heart size is normal.  The lung volumes are low. Minimal bibasilar atelectasis is stable.  Mild degenerative endplate change and exaggerated kyphosis is stable.  IMPRESSION:  1.  Low lung volumes. 2.  No acute cardiopulmonary disease.   Original Report Authenticated By: Marin Roberts, M.D.    US Abdomen Complete  03/28/2012  *RADIOLOGY REPORT*  Clinical Data:  67 year old male with elevated LFTs and fever. Recent hemorrhoid surgery.  ABDOMINAL ULTRASOUND COMPLETE  Comparison:  08/06/2010 CT  Findings:  Gallbladder:  Gallbladder wall thickening is present. There is no evidence of cholelithiasis, pericholecystic fluid or sonographic Murphy's sign.  Common Bile Duct:  There is no evidence of intrahepatic or extrahepatic biliary dilation. The CBD measures 2.4 mm in greatest diameter.  Liver:  The liver is within normal limits in parenchymal echogenicity. No focal abnormalities are identified.  IVC:  Appears normal.  Pancreas:  Although the pancreas is difficult to visualize in its entirety, no focal pancreatic abnormality is identified.  Spleen:  Within normal limits in size and echotexture.  Right kidney:  The right kidney is normal in size and parenchymal echogenicity.  There is no evidence of solid mass, hydronephrosis or definite renal calculi.  The  right kidney measures 11.5 cm.  Left kidney:  The left kidney is normal in size and parenchymal echogenicity.  There is no evidence of solid mass, hydronephrosis or definite renal calculi.   The left kidney measures 11.5 cm.  Abdominal Aorta:  No abdominal aortic aneurysm  identified.  There is no evidence of ascites.  IMPRESSION: Gallbladder wall thickening without evidence of cholelithiasis or other signs of cholecystitis.  This wall thickening is likely related to hepatic dysfunction, but if there is strong clinical suspicion for acute cholecystitis, consider nuclear medicine study.  No other significant abnormalities identified.  Liver appears unremarkable sonographically.   Original Report Authenticated By: Harmon Pier, M.D.    Ct Abdomen Pelvis W Contrast  03/28/2012  *RADIOLOGY REPORT*  Clinical Data: Hemorrhoid surgery last week.  Fevers to 1/4 over the weekend.  Intermittent chills and fevers.  Loose stools.  CT ABDOMEN AND PELVIS WITH CONTRAST  Technique:  Multidetector CT imaging of the abdomen and pelvis was performed following the standard protocol during bolus administration of intravenous contrast.  Contrast: 80mL OMNIPAQUE IOHEXOL 300 MG/ML  SOLN  Comparison: CT abdomen and pelvis 08/06/2010 from [facility].  Findings: There is atelectasis in the lung bases, greater on the right.  Focal sub centimeter low attenuation lesions in the dome of the liver likely represent small cyst and appears stable since previous study.  There is mild gallbladder wall thickening and pericholecystic edema with a stone in the gallbladder neck.  No bile duct dilatation.  This is new since the previous study and may represent developing inflammatory change in the gallbladder. Correlate clinically for cholecystitis.  Spleen size is normal. The pancreas, adrenal glands, kidneys, inferior vena cava, and retroperitoneal lymph nodes are unremarkable.  Calcification of the abdominal aorta without aneurysm.  The stomach,  small bowel, and colon are decompressed.  No free air or free fluid in the abdomen. Abdominal wall musculature appears intact.  Pelvis:  Diffuse enlargement of the prostate gland, measuring 6.2 x 4.7 cm.  Bladder wall is not thickened.  Small left inguinal hernia containing fat.  Scattered diverticula in the sigmoid colon without diverticulitis.  No free or loculated pelvic fluid collections. The appendix is not identified.  Degenerative changes in the lumbar spine with normal alignment.  IMPRESSION: Small stone in the gallbladder neck with mild gallbladder wall thickening pericholecystic edema.  Findings are nonspecific but could represent early changes of cholecystitis.  Diffuse enlargement prostate gland.  Small left inguinal hernia containing fat. No evidence of bowel obstruction or ileus.  Atelectasis in the lung bases.   Original Report Authenticated By: Burman Nieves, M.D.     Review of Systems  Constitutional: Positive for fever, chills and malaise/fatigue.  Eyes: Negative for blurred vision and redness.  Respiratory: Negative for cough, sputum production, shortness of breath and wheezing.   Cardiovascular: Negative for chest pain and palpitations.  Gastrointestinal: Positive for nausea, abdominal pain and diarrhea. Negative for vomiting, blood in stool and melena.  Genitourinary: Negative for dysuria, urgency, frequency, hematuria and flank pain.  Skin: Negative for itching and rash.  Neurological: Negative for dizziness and headaches.    Blood pressure 119/84, pulse 96, temperature 99.7 F (37.6 C), temperature source Oral, resp. rate 18, height 5\' 7"  (1.702 m), weight 178 lb 12.8 oz (81.103 kg), SpO2 94.00%. Physical Exam  Constitutional: He is oriented to person, place, and time. He appears well-developed and well-nourished. No distress.  HENT:  Head: Normocephalic and atraumatic.  Eyes: Pupils are equal, round, and reactive to light.  Neck: Normal range of motion.   Cardiovascular: Regular rhythm.   Respiratory: Effort normal and breath sounds normal. No respiratory distress.  GI: Soft. He exhibits no distension. There is no tenderness. There is no rebound and no guarding.  Musculoskeletal: Normal range  of motion.  Neurological: He is alert and oriented to person, place, and time.     Assessment/Plan BRANDI ARMATO is a 67 y.o. M who is s/p recent hemorrhoidectomy.  He developed fevers and was found to have elevated LFTs.  RUQ [place] and CT show min gallbladder pathology and possibly a nonobstructive cystic duct stone.  These do not explain his LFTs or his symptoms well.  There is no signs of pelvic sepsis either.  Agree with HIDA.  Cont BSA.  Will follow with you.   Ebone Alcivar C. 03/29/2012, 12:43 AM

## 2012-03-29 NOTE — Consult Note (Signed)
Referring Provider: No ref. provider found Primary Care Physician:  Rogelia Boga, MD Primary Gastroenterologist:  ?  Reason for Consultation:  Elevated LFT's; fever  HPI: Kenneth Little is a 67 y.o. male with history of diabetes mellitus type 2, hypertension and hyperlipidemia who had hemorrhoidectomy last week.  Started having fever and chills 4 days ago (on Saturday) and at that time had come to the ER. Workup including a CT angiogram of the chest was unremarkable and patient was discharged home. Patient since the evening of 3/18 started getting fever and chills again and yesterday morning noticed drenching sweats and presented back to the ER. Did not have any nausea, vomiting, or abdominal pain; says that he has some heartburn/indigestion sensation. Did have 2 episodes of diarrhea, but says that he gets that when he drinks milk and he had drank a lot of milk.  In the ER patient was initially found to be tachy and hypotensive and was given 2 L normal saline. Blood work showed elevated LFTs. Sonogram of the abdomen shows thickening of the gallbladder but no definite features of cholecystitis; no liver abnormalities. Chest x-ray and UA are unremarkable. Blood cultures have been obtained and are positive for gram-negative rods.  Empiric antibiotics have been started (vanco and zosyn).  Patient was admitted for possible sepsis with source not known.  Today LFT's are even higher with transaminases reaching 300's and total bili climbing to 2.1 as well.  CT scan of the abdomen and pelvis showed a small stone in the gallbladder neck with mild gallbladder wall thickening and pericholecystic edema.  No other acute abnormalities were identified.  HIDA scan has been ordered by surgery, and the patient is getting ready to go for that study now.  No elevated WBC count.  He had not started any new medications except for some percocet after his hemorrhoid surgery.  Tylenol level was negative.  Viral hepatitis panel  is pending.  Past Medical History  Diagnosis Date  . Hypertension   . Hyperlipidemia   . UTI (lower urinary tract infection)   . Diabetes mellitus without complication     Past Surgical History  Procedure Laterality Date  . Hemorrhoid surgery  03/20/12    internal  . Diverticulitis      Prior to Admission medications   Medication Sig Start Date End Date Taking? Authorizing Provider  acetaminophen (TYLENOL) 500 MG tablet Take 500 mg by mouth every 6 (six) hours as needed for pain. For pain   Yes Historical Provider, MD  aspirin EC 81 MG tablet Take 81 mg by mouth daily.   Yes Historical Provider, MD  atorvastatin (LIPITOR) 40 MG tablet Take 40 mg by mouth daily.   Yes Historical Provider, MD  ergocalciferol (VITAMIN D2) 50000 UNITS capsule Take 50,000 Units by mouth once a week. Thursday   Yes Historical Provider, MD  glipiZIDE (GLUCOTROL XL) 10 MG 24 hr tablet Take 10 mg by mouth daily.   Yes Historical Provider, MD  indapamide (LOZOL) 2.5 MG tablet Take 2.5 mg by mouth every morning.   Yes Historical Provider, MD  insulin glargine (LANTUS) 100 UNIT/ML injection Inject 20 Units into the skin at bedtime.   Yes Historical Provider, MD  INVOKANA 300 MG TABS Take 1 capsule by mouth daily.  11/08/11  Yes Historical Provider, MD  metFORMIN (GLUMETZA) 1000 MG (MOD) 24 hr tablet Take 1,000 mg by mouth daily with breakfast.   Yes Historical Provider, MD  oxyCODONE-acetaminophen (PERCOCET/ROXICET) 5-325 MG per tablet Take 1  tablet by mouth every 4 (four) hours as needed for pain. For pain   Yes Historical Provider, MD  pioglitazone (ACTOS) 45 MG tablet Take 45 mg by mouth daily.  09/05/11  Yes Historical Provider, MD  sitaGLIPtin (JANUVIA) 100 MG tablet Take 100 mg by mouth daily.   Yes Historical Provider, MD  tamsulosin (FLOMAX) 0.4 MG CAPS Take 1 capsule (0.4 mg total) by mouth daily. Start taking 2 days before hemorrhoid surgery 03/07/12  Yes Atilano Ina, MD  valsartan (DIOVAN) 320 MG tablet  Take 320 mg by mouth daily.   Yes Historical Provider, MD  vitamin E 400 UNIT capsule Take 400 Units by mouth daily.   Yes Historical Provider, MD    Current Facility-Administered Medications  Medication Dose Route Frequency Provider Last Rate Last Dose  . 0.9 %  sodium chloride infusion   Intravenous Continuous Eduard Clos, MD 150 mL/hr at 03/28/12 2339    . acetaminophen (TYLENOL) tablet 650 mg  650 mg Oral Q6H PRN Eduard Clos, MD   650 mg at 03/29/12 0358   Or  . acetaminophen (TYLENOL) suppository 650 mg  650 mg Rectal Q6H PRN Eduard Clos, MD      . aspirin EC tablet 81 mg  81 mg Oral Daily Eduard Clos, MD      . enoxaparin (LOVENOX) injection 40 mg  40 mg Subcutaneous QHS Eduard Clos, MD   40 mg at 03/28/12 2355  . glipiZIDE (GLUCOTROL XL) 24 hr tablet 10 mg  10 mg Oral Q breakfast Eduard Clos, MD      . insulin aspart (novoLOG) injection 0-9 Units  0-9 Units Subcutaneous TID WC Eduard Clos, MD      . insulin glargine (LANTUS) injection 20 Units  20 Units Subcutaneous QHS Eduard Clos, MD   20 Units at 03/28/12 2357  . linagliptin (TRADJENTA) tablet 5 mg  5 mg Oral Daily Eduard Clos, MD      . ondansetron [facility]) tablet 4 mg  4 mg Oral Q6H PRN Eduard Clos, MD       Or  . ondansetron [facility]) injection 4 mg  4 mg Intravenous Q6H PRN Eduard Clos, MD      . piperacillin-tazobactam (ZOSYN) IVPB 3.375 g  3.375 g Intravenous Q8H Celene Kras, MD   3.375 g at 03/29/12 0525  . sodium chloride 0.9 % injection 3 mL  3 mL Intravenous Q12H Eduard Clos, MD      . tamsulosin (FLOMAX) capsule 0.4 mg  0.4 mg Oral Daily Eduard Clos, MD      . vancomycin (VANCOCIN) IVPB 1000 mg/200 mL premix  1,000 mg Intravenous Q12H Alison Murray, MD        Allergies as of 03/28/2012  . (No Known Allergies)    Family History  Problem Relation Age of Onset  . Cancer Mother     breast  . Hypertension Father   .  Diabetes Brother     History   Social History  . Marital Status: Single    Spouse Name: N/A    Number of Children: N/A  . Years of Education: N/A   Occupational History  . Not on file.   Social History Main Topics  . Smoking status: Former Smoker    Types: Cigarettes    Quit date: 11/20/1988  . Smokeless tobacco: Never Used  . Alcohol Use: No  . Drug Use: No  . Sexually  Active: Yes   Other Topics Concern  . Not on file   Social History Narrative  . No narrative on file    Review of Systems: Ten point ROS is O/W negative except as mentioned in HPI.  Physical Exam: Vital signs in last 24 hours: Temp:  [98 F (36.7 C)-102.9 F (39.4 C)] 98 F (36.7 C) (03/20 0800) Pulse Rate:  [58-120] 66 (03/20 0900) Resp:  [13-27] 15 (03/20 0900) BP: (94-127)/(46-84) 110/52 mmHg (03/20 0900) SpO2:  [93 %-99 %] 98 % (03/20 0900) FiO2 (%):  [92 %] 92 % (03/19 1506) Weight:  [178 lb 12.8 oz (81.103 kg)] 178 lb 12.8 oz (81.103 kg) (03/19 2325) Last BM Date: 03/28/12 General:   Alert, Well-developed, well-nourished, pleasant and cooperative in NAD Head:  Normocephalic and atraumatic. Eyes:  Sclera clear, no icterus.  Conjunctiva pink. Ears:  Normal auditory acuity. Mouth:  No deformity or lesions.   Lungs:  Clear throughout to auscultation.  No wheezes, crackles, or rhonchi.  Heart:  Regular rate and rhythm; no murmurs, clicks, rubs,  or gallops. Abdomen:  Soft, non-distended, non-tender, BS active, nonpalp mass or hsm.   Rectal:  Deferred  Msk:  Symmetrical without gross deformities. Pulses:  Normal pulses noted. Extremities:  Without clubbing or edema. Neurologic:  Alert and  oriented x4;  grossly normal neurologically. Skin:  Intact without significant lesions or rashes. Psych:  Alert and cooperative. Normal mood and affect.  Intake/Output from previous day: 03/19 0701 - 03/20 0700 In: 3575 [I.V.:3325; IV Piggyback:250] Out: 1850 [Urine:1850] Intake/Output this  shift: Total I/O In: 300 [I.V.:300] Out: 575 [Urine:575]  Lab Results:  Recent Labs  03/28/12 1600 03/29/12 0001 03/29/12 0630  WBC 2.2* 10.4 10.4  HGB 13.1 12.1* 12.8*  HCT 38.5* 35.9* 37.5*  PLT 173 153 148*   BMET  Recent Labs  03/28/12 1600 03/29/12 0001 03/29/12 0630  NA 137  --  135  K 4.0  --  3.1*  CL 95*  --  100  CO2 28  --  25  GLUCOSE 204*  --  89  BUN 21  --  18  CREATININE 0.99 1.22 1.01  CALCIUM 9.5  --  8.4   LFT  Recent Labs  03/29/12 0630  PROT 5.6*  ALBUMIN 2.3*  AST 360*  ALT 358*  ALKPHOS 269*  BILITOT 2.6*  BILIDIR 2.1*  IBILI 0.5   PT/INR  Recent Labs  03/29/12 0001  LABPROT 13.0  INR 0.99   Studies/Results: Dg Chest 2 View  03/28/2012  *RADIOLOGY REPORT*  Clinical Data: Fever and chills.  CHEST - 2 VIEW  Comparison: Two-view chest 02/26/2012.  Findings: The heart size is normal.  The lung volumes are low. Minimal bibasilar atelectasis is stable.  Mild degenerative endplate change and exaggerated kyphosis is stable.  IMPRESSION:  1.  Low lung volumes. 2.  No acute cardiopulmonary disease.   Original Report Authenticated By: Marin Roberts, M.D.    US Abdomen Complete  03/28/2012  *RADIOLOGY REPORT*  Clinical Data:  67 year old male with elevated LFTs and fever. Recent hemorrhoid surgery.  ABDOMINAL ULTRASOUND COMPLETE  Comparison:  08/06/2010 CT  Findings:  Gallbladder:  Gallbladder wall thickening is present. There is no evidence of cholelithiasis, pericholecystic fluid or sonographic Murphy's sign.  Common Bile Duct:  There is no evidence of intrahepatic or extrahepatic biliary dilation. The CBD measures 2.4 mm in greatest diameter.  Liver:  The liver is within normal limits in parenchymal echogenicity. No focal abnormalities are identified.  IVC:  Appears normal.  Pancreas:  Although the pancreas is difficult to visualize in its entirety, no focal pancreatic abnormality is identified.  Spleen:  Within normal limits in size and  echotexture.  Right kidney:  The right kidney is normal in size and parenchymal echogenicity.  There is no evidence of solid mass, hydronephrosis or definite renal calculi.  The right kidney measures 11.5 cm.  Left kidney:  The left kidney is normal in size and parenchymal echogenicity.  There is no evidence of solid mass, hydronephrosis or definite renal calculi.   The left kidney measures 11.5 cm.  Abdominal Aorta:  No abdominal aortic aneurysm identified.  There is no evidence of ascites.  IMPRESSION: Gallbladder wall thickening without evidence of cholelithiasis or other signs of cholecystitis.  This wall thickening is likely related to hepatic dysfunction, but if there is strong clinical suspicion for acute cholecystitis, consider nuclear medicine study.  No other significant abnormalities identified.  Liver appears unremarkable sonographically.   Original Report Authenticated By: Harmon Pier, M.D.    Ct Abdomen Pelvis W Contrast  03/28/2012  *RADIOLOGY REPORT*  Clinical Data: Hemorrhoid surgery last week.  Fevers to 1/4 over the weekend.  Intermittent chills and fevers.  Loose stools.  CT ABDOMEN AND PELVIS WITH CONTRAST  Technique:  Multidetector CT imaging of the abdomen and pelvis was performed following the standard protocol during bolus administration of intravenous contrast.  Contrast: 80mL OMNIPAQUE IOHEXOL 300 MG/ML  SOLN  Comparison: CT abdomen and pelvis 08/06/2010 from [facility].  Findings: There is atelectasis in the lung bases, greater on the right.  Focal sub centimeter low attenuation lesions in the dome of the liver likely represent small cyst and appears stable since previous study.  There is mild gallbladder wall thickening and pericholecystic edema with a stone in the gallbladder neck.  No bile duct dilatation.  This is new since the previous study and may represent developing inflammatory change in the gallbladder. Correlate clinically for cholecystitis.  Spleen size is normal. The  pancreas, adrenal glands, kidneys, inferior vena cava, and retroperitoneal lymph nodes are unremarkable.  Calcification of the abdominal aorta without aneurysm.  The stomach, small bowel, and colon are decompressed.  No free air or free fluid in the abdomen. Abdominal wall musculature appears intact.  Pelvis:  Diffuse enlargement of the prostate gland, measuring 6.2 x 4.7 cm.  Bladder wall is not thickened.  Small left inguinal hernia containing fat.  Scattered diverticula in the sigmoid colon without diverticulitis.  No free or loculated pelvic fluid collections. The appendix is not identified.  Degenerative changes in the lumbar spine with normal alignment.  IMPRESSION: Small stone in the gallbladder neck with mild gallbladder wall thickening pericholecystic edema.  Findings are nonspecific but could represent early changes of cholecystitis.  Diffuse enlargement prostate gland.  Small left inguinal hernia containing fat. No evidence of bowel obstruction or ileus.  Atelectasis in the lung bases.   Original Report Authenticated By: Burman Nieves, M.D.     IMPRESSION:  -Fever with hypotension and gram-negative rod bacteremia in a patient with some abnormal gallbladder findings on CT scan and U/S.  Likely gallbladder etiology/biliary sepsis.  Clinically does not have pancreatitis and there is no sign of biliary dilation or CBD stones on imaging.   PLAN: -Await results of HIDA scan. -Continue empiric abx. -Follow-up results of viral hepatitis panel.   Senya Hinzman D.  03/29/2012, 9:56 AM  Pager number [phone]

## 2012-03-29 NOTE — Progress Notes (Signed)
Social visit.  Chart and events reviewed. D/w Dr Gerrit Friends Issues last weekend with ED visit last Sat/Sun am with neg workup. Spoke with pt on Tuesday and doing great. Had return of low grade fevers and chills on Wed. 1 large episode of diarrhea on Wed. Had some epigastric discomfort after eating rice on Wed, no radiation, no n/v. No dysuria. No melena/hematochezia.   Ate late lunch and dinner today without pain. No n/v.  Alert, nad Smiling, laughing abd soft, nt, nd Rectal - gross visual appears nml considering b/l excisional open hemorrhoidectomy last week. No signs of infection  Elevated LFTs GNR bacteremia DM  Very atypical presentation for GB given indeterminate HIDA with normal ducts and elevated LFTs. Hepatitis panel still pending. Bacteremia explains f/c but question source - ?GB, ?liver. No signs of pelvic sepsis from recent hemorrhoidectomy.   My preference would be to follow LFTs, wbc, and exam. If LFTs start to normalize and continues to clinically improve then offer short interval cholecystectomy (somewhat reluctant to offer general anesthesia/lap chole in setting of GNR bacteremia). If however LFTs stay elevated, exam changes, or clinically worsens and it appears GB is source then rec percutaneous cholecystostomy tube. (pt's wife had perc GB tube last year). Still labs and nml ducts on HIDA and u/s is a little confusing.   Dr Gerrit Friends and on-call partners will follow while pt in hospital.   Mary Sella. Andrey Campanile, MD, FACS General, Bariatric, & Minimally Invasive Surgery [facility], [place]

## 2012-03-29 NOTE — Progress Notes (Signed)
CARE MANAGEMENT NOTE 03/29/2012  Patient:  ZAELYN, BARBARY   Account Number:  [id number]  Date Initiated:  03/29/2012  Documentation initiated by:  Gage Weant  Subjective/Objective Assessment:   pancreatitis aith netropenia, temp 102.3 and wbc 2.2.     Action/Plan:   lives at home inped.   Anticipated DC Date:  04/01/2012   Anticipated DC Plan:  HOME/SELF CARE  In-house referral  NA      DC Planning Services  NA      [facility] Choice  NA   Choice offered to / List presented to:  NA   DME arranged  NA      DME agency  NA     HH arranged  NA      HH agency  NA   Status of service:  In process, will continue to follow Medicare Important Message given?  NA - LOS <3 / Initial given by admissions (If response is "NO", the following Medicare IM given date fields will be blank) Date Medicare IM given:   Date Additional Medicare IM given:    Discharge Disposition:    Per UR Regulation:  Reviewed for med. necessity/level of care/duration of stay  If discussed at Long Length of Stay Meetings, dates discussed:    Comments:  [id number]/Nellie Chevalier Earlene Plater, RN, BSN, CCM:  CHART REVIEWED AND UPDATED.  Next chart review due on [id number]. NO DISCHARGE NEEDS PRESENT AT THIS TIME. CASE MANAGEMENT [phone]

## 2012-03-29 NOTE — Progress Notes (Signed)
ANTIBIOTIC CONSULT NOTE - INITIAL  Pharmacy Consult for Vancomycin and Zosyn  Indication: rule out sepsis  No Known Allergies  Patient Measurements: Height: 5\' 7"  (170.2 cm) Weight: 178 lb 12.8 oz (81.103 kg) IBW/kg (Calculated) : 66.1 Adjusted Body Weight:   Vital Signs: Temp: 99.2 F (37.3 C) (03/20 0500) Temp src: Oral (03/20 0500) BP: 117/54 mmHg (03/20 0400) Pulse Rate: 81 (03/20 0400) Intake/Output from previous day: 03/19 0701 - 03/20 0700 In: 3125 [I.V.:2875; IV Piggyback:250] Out: 1850 [Urine:1850] Intake/Output from this shift: Total I/O In: 1000 [I.V.:750; IV Piggyback:250] Out: 1700 [Urine:1700]  Labs:  Recent Labs  03/28/12 1600 03/29/12 0001  WBC 2.2* 10.4  HGB 13.1 12.1*  PLT 173 153  CREATININE 0.99 1.22   Estimated Creatinine Clearance: 62.4 ml/min (by C-G formula based on Cr of 1.22). No results found for this basename: VANCOTROUGH, Leodis Binet, VANCORANDOM, GENTTROUGH, GENTPEAK, GENTRANDOM, TOBRATROUGH, TOBRAPEAK, TOBRARND, AMIKACINPEAK, AMIKACINTROU, AMIKACIN,  in the last 72 hours   Microbiology: Recent Results (from the past 720 hour(s))  CULTURE, BLOOD (ROUTINE X 2)     Status: None   Collection Time    03/25/12  1:15 AM      Result Value Range Status   Specimen Description BLOOD RIGHT HAND   Final   Special Requests BOTTLES DRAWN AEROBIC AND ANAEROBIC 5CC EACH   Final   Culture  Setup Time 03/25/2012 15:03   Final   Culture     Final   Value:        BLOOD CULTURE RECEIVED NO GROWTH TO DATE CULTURE WILL BE HELD FOR 5 DAYS BEFORE ISSUING A FINAL NEGATIVE REPORT   Report Status PENDING   Incomplete  CULTURE, BLOOD (ROUTINE X 2)     Status: None   Collection Time    03/25/12  1:25 AM      Result Value Range Status   Specimen Description BLOOD LEFT ARM   Final   Special Requests BOTTLES DRAWN AEROBIC AND ANAEROBIC 5C EACH   Final   Culture  Setup Time 03/25/2012 15:03   Final   Culture     Final   Value:        BLOOD CULTURE RECEIVED NO  GROWTH TO DATE CULTURE WILL BE HELD FOR 5 DAYS BEFORE ISSUING A FINAL NEGATIVE REPORT   Report Status PENDING   Incomplete  MRSA PCR SCREENING     Status: None   Collection Time    03/28/12 11:23 PM      Result Value Range Status   MRSA by PCR NEGATIVE  NEGATIVE Final   Comment:            The GeneXpert MRSA Assay (FDA     approved for NASAL specimens     only), is one component of a     comprehensive MRSA colonization     surveillance program. It is not     intended to diagnose MRSA     infection nor to guide or     monitor treatment for     MRSA infections.    Medical History: Past Medical History  Diagnosis Date  . Hypertension   . Hyperlipidemia   . UTI (lower urinary tract infection)   . Diabetes mellitus without complication     Medications:  Anti-infectives   Start     Dose/Rate Route Frequency Ordered Stop   03/29/12 1200  vancomycin (VANCOCIN) IVPB 1000 mg/200 mL premix     1,000 mg 200 mL/hr over 60 Minutes Intravenous Every  12 hours 03/29/12 0559     03/29/12 0600  piperacillin-tazobactam (ZOSYN) IVPB 3.375 g     3.375 g 12.5 mL/hr over 240 Minutes Intravenous 3 times per day 03/28/12 2310     03/28/12 2315  vancomycin (VANCOCIN) IVPB 1000 mg/200 mL premix     1,000 mg 200 mL/hr over 60 Minutes Intravenous  Once 03/28/12 2308 03/29/12 0122   03/28/12 1900  piperacillin-tazobactam (ZOSYN) IVPB 3.375 g     3.375 g 12.5 mL/hr over 240 Minutes Intravenous  Once 03/28/12 1853 03/28/12 2141     Assessment: Patient with sepsis. First dose of antibiotics already given in ED.  Goal of Therapy:  Vancomycin trough level 15-20 mcg/ml Zosyn based on renal function   Plan:  Measure antibiotic drug levels at steady state Follow up culture results Vancomycin 750mg  iv q12hr Zosyn 3.375g IV Q8H infused over 4hrs.   Darlina Guys, Jacquenette Shone Crowford 03/29/2012,6:02 AM

## 2012-03-29 NOTE — Progress Notes (Signed)
[facility] PROGRESS NOTE  NOLLIE TERLIZZI NWG:[id number] DOB: 12-Dec-1945 DOA: 03/28/2012 PCP: Rogelia Boga, MD  Brief narrative: 67 y.o. male with past medical history significant for hypertension, diabetes, dyslipidemia and recent hemorrhoidectomy who presented to [facility] ED 03/28/2012 for persistent fever and chills. Patient was in ED 4 days prior to this admission due to fever and workup included CT chest angiogram which was negative and patient was subsequently discharged home. In ED, patient was tachycardic and hypotensive along with the temperature of 102.9 F. His work up further revealed elevated liver enzymes. Blood cultures from 03/28/2012 are growing gram negative rods. Patient had HIDA scan which revealed gallbladder wall thickening but no obvious cholelithiasis (Abdominal US showed the sam results). CT abdomen showed gallbladder wall thickening with pericholecystic edema, findings nonspecific but may be suggestive of early cholecystitis.  Assessment/Plan:  Principal Problem:   Fever, sepsis, gram negative bacteremia  Started empirically on vanco and zosyn. Unclear source of bacteremia. Patient did have recent hemorrhoidectomy as well as persistently elevated liver enzymes. Possible cholecystitis.  We will repeat blood cultures again tomorrow but will continue current antibiotic regimen. Check procalcitonin level. Lactic acid is within normal limits.  Appreciate surgery and GI following.  Acute hepatitis panel is WNL.  Continue IV fluids, antiemetics. Active Problems: Abnormal LFT's  Possible gallbladder etiology although HIDA scan only shows gallbladder wall thickening. Abdominal US shows the same without cholelithiasis or evidence of cholecystitis.   Trend liver enzymes   Diabetes mellitus  Continue Lantus 20 units Q HS, continue sliding scale insulin  Continue tradjenta and glipizide Hypokalemia  Secondary to GI losses  repleted today  Follow up BMP in  am   Code Status: full code Family Communication: at bedside Disposition Plan: home when stable  Manson Passey, MD  [facility] Pager [phone]  If 7PM-7AM, please contact night-coverage [email or web address] Password [email or web address] 03/29/2012, 4:39 PM   LOS: 1 day   Consultants:  Surgery  Gastroenterology  Procedures:  None   Antibiotics:  Vancomycin 03/28/2012 -->  Zosyn 03/28/2012 -->  HPI/Subjective: No acute overnight events.  Objective: Filed Vitals:   03/29/12 1000 03/29/12 1234 03/29/12 1300 03/29/12 1400  BP: 97/47 101/51 101/50 111/57  Pulse: 63 70 66 79  Temp:      TempSrc:      Resp: 16 18 15 19   Height:      Weight:      SpO2: 97% 97% 97% 98%    Intake/Output Summary (Last 24 hours) at 03/29/12 1639 Last data filed at 03/29/12 1445  Gross per 24 hour  Intake   3925 ml  Output   2775 ml  Net   1150 ml    Exam:   General:  Pt is alert, follows commands appropriately, not in acute distress  Cardiovascular: Regular rate and rhythm, S1/S2, no murmurs, no rubs, no gallops  Respiratory: Clear to auscultation bilaterally, no wheezing, no crackles, no rhonchi  Abdomen: Soft, some tenderness in mid abdomen, bowel sounds present, no guarding  Extremities: No edema, pulses DP and PT palpable bilaterally  Neuro: Grossly nonfocal  Data Reviewed: Basic Metabolic Panel:  Recent Labs Lab 03/25/12 0115 03/28/12 1600 03/29/12 0001 03/29/12 0630  NA 130* 137  --  135  K 3.3* 4.0  --  3.1*  CL 95* 95*  --  100  CO2 27 28  --  25  GLUCOSE 229* 204*  --  89  BUN 22 21  --  18  CREATININE 1.09 0.99 1.22 1.01  CALCIUM 8.6 9.5  --  8.4   Liver Function Tests:  Recent Labs Lab 03/25/12 0115 03/28/12 1600 03/29/12 0630  AST 63* 252* 360*  ALT 86* 147* 358*  ALKPHOS 157* 245* 269*  BILITOT 0.9 1.7* 2.6*  PROT 6.0 6.9 5.6*  ALBUMIN 3.0* 3.1* 2.3*    Recent Labs Lab 03/28/12 1600  LIPASE 66*   No results found for this basename: AMMONIA,  in the last 168  hours CBC:  Recent Labs Lab 03/25/12 0115 03/28/12 1600 03/29/12 0001 03/29/12 0630  WBC 6.0 2.2* 10.4 10.4  NEUTROABS  --  2.0  --  8.7*  HGB 12.9* 13.1 12.1* 12.8*  HCT 37.6* 38.5* 35.9* 37.5*  MCV 90.0 90.2 89.5 89.3  PLT 148* 173 153 148*   Cardiac Enzymes:  Recent Labs Lab 03/29/12 0001  TROPONINI <0.30   BNP: No components found with this basename: POCBNP,  CBG:  Recent Labs Lab 03/28/12 1936 03/29/12 0320 03/29/12 0809 03/29/12 1011 03/29/12 1040  GLUCAP 240* 115* 82 66* 117*    CULTURE, BLOOD (ROUTINE X 2)     Status: None   Collection Time    03/25/12  1:15 AM      Result Value Range Status   Specimen Description BLOOD RIGHT HAND   Final   Special Requests BOTTLES DRAWN AEROBIC AND ANAEROBIC 5CC EACH   Final   Culture  Setup Time 03/25/2012 15:03   Final   Culture     Final   Value:        BLOOD CULTURE RECEIVED NO GROWTH TO DATE CULTURE WILL BE HELD FOR 5 DAYS BEFORE ISSUING A FINAL NEGATIVE REPORT   Report Status PENDING   Incomplete  CULTURE, BLOOD (ROUTINE X 2)     Status: None   Collection Time    03/25/12  1:25 AM      Result Value Range Status   Specimen Description BLOOD LEFT ARM   Final   Special Requests BOTTLES DRAWN AEROBIC AND ANAEROBIC 5C EACH   Final   Culture  Setup Time 03/25/2012 15:03   Final   Culture     Final   Value:        BLOOD CULTURE RECEIVED NO GROWTH TO DATE CULTURE WILL BE HELD FOR 5 DAYS BEFORE ISSUING A FINAL NEGATIVE REPORT   Report Status PENDING   Incomplete  CULTURE, BLOOD (ROUTINE X 2)     Status: None   Collection Time    03/28/12  3:50 PM      Result Value Range Status   Specimen Description BLOOD RIGHT ARM   Final   Special Requests BOTTLES DRAWN AEROBIC ONLY 4CC   Final   Culture  Setup Time 03/28/2012 21:11   Final   Culture     Final   Value: GRAM NEGATIVE RODS     Note: Gram Stain Report Called to,Read Back By and Verified With: JENNA M @0824  03/29/12 BY KRAWS   Report Status PENDING   Incomplete   CULTURE, BLOOD (ROUTINE X 2)     Status: None   Collection Time    03/28/12  4:10 PM      Result Value Range Status   Specimen Description BLOOD LEFT ARM   Final   Special Requests BOTTLES DRAWN AEROBIC AND ANAEROBIC 4CC   Final   Culture  Setup Time 03/28/2012 21:11   Final   Culture     Final   Value: GRAM NEGATIVE RODS     Note:  Gram Stain Report Called to,Read Back By and Verified With: JENNA M @0824  03/29/12 BY KRAWS   Report Status PENDING   Incomplete  MRSA PCR SCREENING     Status: None   Collection Time    03/28/12 11:23 PM      Result Value Range Status   MRSA by PCR NEGATIVE  NEGATIVE Final     Studies: Dg Chest 2 View 03/28/2012  *  IMPRESSION:  1.  Low lung volumes. 2.  No acute cardiopulmonary disease.     Nm Hepatobiliary Liver Func 03/29/2012  * IIMPRESSION: Gallbladder and small bowel visualized in appropriate time frame.  There is a photopenic rim surrounding the gallbladder which corresponds to the gallbladder wall thickening.  The etiology of this gallbladder wall thickening is indeterminate.  Clinical and laboratory correlation recommended.     US Abdomen Complete 03/28/2012  * IMPRESSION: Gallbladder wall thickening without evidence of cholelithiasis or other signs of cholecystitis.  This wall thickening is likely related to hepatic dysfunction, but if there is strong clinical suspicion for acute cholecystitis, consider nuclear medicine study.  No other significant abnormalities identified.  Liver appears unremarkable sonographically.     Ct Abdomen Pelvis W Contrast 03/28/2012  * IMPRESSION: Small stone in the gallbladder neck with mild gallbladder wall thickening pericholecystic edema.  Findings are nonspecific but could represent early changes of cholecystitis.  Diffuse enlargement prostate gland.  Small left inguinal hernia containing fat. No evidence of bowel obstruction or ileus.  Atelectasis in the lung bases.    Scheduled Meds: . aspirin EC  81 mg Oral Daily   . enoxaparin (LOVENOX)   40 mg Subcutaneous QHS  . glipiZIDE  10 mg Oral Q breakfast  . insulin aspart  0-9 Units Subcutaneous TID WC  . insulin glargine  20 Units Subcutaneous QHS  . linagliptin  5 mg Oral Daily  . piperacillin-tazobactam  3.375 g Intravenous Q8H  . tamsulosin  0.4 mg Oral Daily  . vancomycin  1,000 mg Intravenous Q12H   Continuous Infusions: . sodium chloride 150 mL/hr at 03/28/12 2339

## 2012-03-29 NOTE — Progress Notes (Signed)
Hypoglycemic Event  CBG: 66  Treatment: D50 IV 25 mL  Symptoms: None  Follow-up CBG: Time:1043 CBG Result: 117  Possible Reasons for Event: Inadequate meal intake  Comments/MD notified: pt NPO for procedure, no oral hypoglycemics given, RN decided to re-check CBG before procedure and found blood glucose to be low    Jolayne Haines  Remember to initiate Hypoglycemia Order Set & complete

## 2012-03-30 ENCOUNTER — Other Ambulatory Visit (INDEPENDENT_AMBULATORY_CARE_PROVIDER_SITE_OTHER): Payer: Self-pay | Admitting: General Surgery

## 2012-03-30 DIAGNOSIS — B9689 Other specified bacterial agents as the cause of diseases classified elsewhere: Secondary | ICD-10-CM

## 2012-03-30 DIAGNOSIS — R7881 Bacteremia: Secondary | ICD-10-CM

## 2012-03-30 DIAGNOSIS — K759 Inflammatory liver disease, unspecified: Secondary | ICD-10-CM

## 2012-03-30 DIAGNOSIS — R972 Elevated prostate specific antigen [PSA]: Secondary | ICD-10-CM

## 2012-03-30 DIAGNOSIS — N400 Enlarged prostate without lower urinary tract symptoms: Secondary | ICD-10-CM

## 2012-03-30 LAB — GLUCOSE, CAPILLARY
Glucose-Capillary: 113 mg/dL — ABNORMAL HIGH (ref 70–99)
Glucose-Capillary: 169 mg/dL — ABNORMAL HIGH (ref 70–99)

## 2012-03-30 LAB — COMPREHENSIVE METABOLIC PANEL
ALT: 248 U/L — ABNORMAL HIGH (ref 0–53)
Albumin: 2.2 g/dL — ABNORMAL LOW (ref 3.5–5.2)
Alkaline Phosphatase: 250 U/L — ABNORMAL HIGH (ref 39–117)
Calcium: 8.4 mg/dL (ref 8.4–10.5)
Potassium: 3.6 mEq/L (ref 3.5–5.1)
Sodium: 134 mEq/L — ABNORMAL LOW (ref 135–145)
Total Protein: 5.5 g/dL — ABNORMAL LOW (ref 6.0–8.3)

## 2012-03-30 LAB — CBC
MCHC: 33.7 g/dL (ref 30.0–36.0)
Platelets: 162 10*3/uL (ref 150–400)
RDW: 13.4 % (ref 11.5–15.5)

## 2012-03-30 LAB — HEPATITIS PANEL, ACUTE
HCV Ab: NEGATIVE
Hepatitis B Surface Ag: NEGATIVE

## 2012-03-30 NOTE — Progress Notes (Signed)
Patient looks very well, no further epigastric discomfort. Tolerating a regular diet. No further fevers. Gram-negative rod is an Escherichia coli with sensitivities pending LFTs trending down and I expect they will until normalization Final viral hepatitis panel is pending,  Negative so far No indication for endoscopic intervention Patient had a bowel movement this morning with formed stool and scant bright red blood. I expect this is from his recent hemorrhoidal procedure.  He states he is up-to-date with screening colonoscopy. Please call with questions

## 2012-03-30 NOTE — Progress Notes (Signed)
[facility] PROGRESS NOTE  Kenneth Little QMV:[id number] DOB: 1945-08-21 DOA: 03/28/2012 PCP: Rogelia Boga, MD  Brief narrative: 67 y.o. male with past medical history significant for hypertension, diabetes, dyslipidemia and recent hemorrhoidectomy who presented to [facility] ED 03/28/2012 for persistent fever and chills. Patient was in ED 4 days prior to this admission due to fever and workup included CT chest angiogram which was negative and patient was subsequently discharged home.  In ED, patient was tachycardic and hypotensive along with the temperature of 102.9 F. His work up further revealed elevated liver enzymes. Patient had HIDA scan which revealed gallbladder wall thickening but no obvious cholelithiasis (Abdominal US showed the same results). CT abdomen showed gallbladder wall thickening with pericholecystic edema, findings nonspecific but may be suggestive of early cholecystitis. Blood cultures are growing E.Coli.  Assessment/Plan:   Principal Problem:  Fever, sepsis, gram negative bacteremia  E.Coli bacteremia / sepsis in the settin gof elevated procalcitonin level, 46.69 Continue  vanco and zosyn.  We will repeat blood cultures today.  Lactic acid is within normal limits.  Appreciate surgery and GI following.  Acute hepatitis panel is WNL.  Continue IV fluids, antiemetics. Active Problems:  Abnormal LFT's  Possible gallbladder etiology although HIDA scan only shows gallbladder wall thickening. Abdominal US shows the same without cholelithiasis or evidence of cholecystitis.  Trend liver enzymes: ALP/AST/ALT (360/358/269) --> 153/248/250 today Diabetes mellitus  Continue Lantus 20 units Q HS, continue sliding scale insulin  Continue tradjenta and glipizide CBG's in past 24 hours: 177, 83, 113 Hypokalemia  Secondary to GI losses  Repleted. Potassium is WNL today.  Code Status: full code  Family Communication: at bedside  Disposition Plan: home when stable   Manson Passey, MD  [facility]  Pager [phone]   Consultants:  Surgery  Gastroenterology Procedures:  None  Antibiotics:  Vancomycin 03/28/2012 -->  Zosyn 03/28/2012 -->  If 7PM-7AM, please contact night-coverage [email or web address] Password [email or web address] 03/30/2012, 1:02 PM   LOS: 2 days    HPI/Subjective: No acute overnight events.  Objective: Filed Vitals:   03/30/12 0600 03/30/12 0700 03/30/12 0800 03/30/12 1222  BP: 119/52 123/60 133/65 130/70  Pulse: 59 67 75 67  Temp:   98.1 F (36.7 C)   TempSrc:   Oral   Resp: 13 16 18    Height:      Weight:      SpO2: 95% 94% 94% 98%    Intake/Output Summary (Last 24 hours) at 03/30/12 1302 Last data filed at 03/30/12 1100  Gross per 24 hour  Intake   2240 ml  Output   1925 ml  Net    315 ml    Exam:   General:  Pt is alert, follows commands appropriately, not in acute distress  Cardiovascular: Regular rate and rhythm, S1/S2 apprecaited  Respiratory: Clear to auscultation bilaterally, no wheezing, no crackles, no rhonchi  Abdomen: Soft, non tender, non distended, bowel sounds present, no guarding  Extremities: No edema, pulses DP and PT palpable bilaterally  Neuro: Grossly nonfocal  Data Reviewed: Basic Metabolic Panel:  Recent Labs Lab 03/25/12 0115 03/28/12 1600 03/29/12 0001 03/29/12 0630 03/30/12 0335  NA 130* 137  --  135 134*  K 3.3* 4.0  --  3.1* 3.6  CL 95* 95*  --  100 102  CO2 27 28  --  25 25  GLUCOSE 229* 204*  --  89 112*  BUN 22 21  --  18 19  CREATININE 1.09 0.99 1.22 1.01 0.95  CALCIUM  8.6 9.5  --  8.4 8.4   Liver Function Tests:  Recent Labs Lab 03/25/12 0115 03/28/12 1600 03/29/12 0630 03/30/12 0335  AST 63* 252* 360* 153*  ALT 86* 147* 358* 248*  ALKPHOS 157* 245* 269* 250*  BILITOT 0.9 1.7* 2.6* 1.2  PROT 6.0 6.9 5.6* 5.5*  ALBUMIN 3.0* 3.1* 2.3* 2.2*    Recent Labs Lab 03/28/12 1600  LIPASE 66*   No results found for this basename: AMMONIA,  in the last 168 hours CBC:  Recent  Labs Lab 03/25/12 0115 03/28/12 1600 03/29/12 0001 03/29/12 0630 03/30/12 0335  WBC 6.0 2.2* 10.4 10.4 9.3  NEUTROABS  --  2.0  --  8.7*  --   HGB 12.9* 13.1 12.1* 12.8* 12.1*  HCT 37.6* 38.5* 35.9* 37.5* 35.9*  MCV 90.0 90.2 89.5 89.3 89.5  PLT 148* 173 153 148* 162   Cardiac Enzymes:  Recent Labs Lab 03/29/12 0001  TROPONINI <0.30   BNP: No components found with this basename: POCBNP,  CBG:  Recent Labs Lab 03/29/12 1248 03/29/12 1734 03/29/12 2225 03/30/12 0749 03/30/12 1208  GLUCAP 76 193* 177* 83 113*    CULTURE, BLOOD (ROUTINE X 2)     Status: None   Collection Time    03/25/12  1:15 AM      Result Value Range Status   Culture     Final   Value:        BLOOD CULTURE RECEIVED NO GROWTH TO DATE    Report Status PENDING   Incomplete  CULTURE, BLOOD (ROUTINE X 2)     Status: None   Collection Time    03/25/12  1:25 AM      Result Value Range Status   Culture     Final   Value:        BLOOD CULTURE RECEIVED NO GROWTH TO DATE    Report Status PENDING   Incomplete  CULTURE, BLOOD (ROUTINE X 2)     Status: None   Collection Time    03/28/12  3:50 PM      Result Value Range Status   Culture     Final   Value: ESCHERICHIA COLI   Report Status PENDING   Incomplete  CULTURE, BLOOD (ROUTINE X 2)     Status: None   Collection Time    03/28/12  4:10 PM      Result Value Range Status   Culture     Final   Value: ESCHERICHIA COLI   Report Status PENDING   Incomplete  MRSA PCR SCREENING     Status: None   Collection Time    03/28/12 11:23 PM      Result Value Range Status   MRSA by PCR NEGATIVE  NEGATIVE Final     Studies: Dg Chest 2 View 03/28/2012   IMPRESSION:  1.  Low lung volumes. 2.  No acute cardiopulmonary disease.     Nm Hepatobiliary Liver Func 03/29/2012   IMPRESSION: Gallbladder and small bowel visualized in appropriate time frame.  There is a photopenic rim surrounding the gallbladder which corresponds to the gallbladder wall thickening.  The  etiology of this gallbladder wall thickening is indeterminate.  Clinical and laboratory correlation recommended.     US Abdomen Complete 03/28/2012    IMPRESSION: Gallbladder wall thickening without evidence of cholelithiasis or other signs of cholecystitis.  This wall thickening is likely related to hepatic dysfunction, but if there is strong clinical suspicion for acute cholecystitis, consider nuclear medicine  study.  No other significant abnormalities identified.  Liver appears unremarkable sonographically.     Ct Abdomen Pelvis W Contrast 03/28/2012  * IMPRESSION: Small stone in the gallbladder neck with mild gallbladder wall thickening pericholecystic edema.  Findings are nonspecific but could represent early changes of cholecystitis.  Diffuse enlargement prostate gland.  Small left inguinal hernia containing fat. No evidence of bowel obstruction or ileus.  Atelectasis in the lung bases.      Scheduled Meds: . aspirin EC  81 mg Oral Daily  . enoxaparin (LOVENOX)   40 mg Subcutaneous QHS  . glipiZIDE  10 mg Oral Q breakfast  . insulin aspart  0-9 Units Subcutaneous TID WC  . insulin glargine  20 Units Subcutaneous QHS  . linagliptin  5 mg Oral Daily  . piperacillin-tazobactam   3.375 g Intravenous Q8H  . tamsulosin  0.4 mg Oral Daily  . vancomycin  1,000 mg Intravenous Q12H

## 2012-03-30 NOTE — Progress Notes (Signed)
Subjective: SEE PLAN AT BOTTOM!  No c/o. No n/v. No abd pain. Eating well. 2 bm today with small amount of blood  Objective: Vital signs in last 24 hours: Temp:  [98 F (36.7 C)-99.3 F (37.4 C)] 98.1 F (36.7 C) (03/21 0800) Pulse Rate:  [59-94] 67 (03/21 1222) Resp:  [11-25] 18 (03/21 0800) BP: (95-133)/(40-70) 130/70 mmHg (03/21 1222) SpO2:  [93 %-98 %] 98 % (03/21 1222) Last BM Date: 03/30/12  Intake/Output from previous day: 03/20 0701 - 03/21 0700 In: 3100 [P.O.:300; I.V.:2250; IV Piggyback:550] Out: 2575 [Urine:2575] Intake/Output this shift: Total I/O In: 240 [P.O.:240] Out: 200 [Urine:200]  Alert, nad, Soft, nt  Lab Results:   Recent Labs  03/29/12 0630 03/30/12 0335  WBC 10.4 9.3  HGB 12.8* 12.1*  HCT 37.5* 35.9*  PLT 148* 162   BMET  Recent Labs  03/29/12 0630 03/30/12 0335  NA 135 134*  K 3.1* 3.6  CL 100 102  CO2 25 25  GLUCOSE 89 112*  BUN 18 19  CREATININE 1.01 0.95  CALCIUM 8.4 8.4   PT/INR  Recent Labs  03/29/12 0001  LABPROT 13.0  INR 0.99   ABG No results found for this basename: PHART, PCO2, PO2, HCO3,  in the last 72 hours  Studies/Results: Dg Chest 2 View  03/28/2012  *RADIOLOGY REPORT*  Clinical Data: Fever and chills.  CHEST - 2 VIEW  Comparison: Two-view chest 02/26/2012.  Findings: The heart size is normal.  The lung volumes are low. Minimal bibasilar atelectasis is stable.  Mild degenerative endplate change and exaggerated kyphosis is stable.  IMPRESSION:  1.  Low lung volumes. 2.  No acute cardiopulmonary disease.   Original Report Authenticated By: Marin Roberts, M.D.    Nm Hepatobiliary Liver Func  03/29/2012  *RADIOLOGY REPORT*  Clinical Data: Abnormal ultrasound with gallbladder wall thickening.  NUCLEAR MEDICINE HEPATOHBILIARY INCLUDE GB  Radiopharmaceutical:  5.1 mCi technetium Choletec.  Comparison: CT and ultrasound 03/28/2012.  Findings: Homogeneous uptake by the liver.  The gallbladder is visualized  by a 20 minutes.  Small bowel is visualized by a 40 minutes.  There is a photopenic rim surrounding the gallbladder which corresponds to the gallbladder wall thickening.  The etiology of this gallbladder wall thickening is indeterminate.  Clinical and laboratory correlation recommended.  IMPRESSION: Gallbladder and small bowel visualized in appropriate time frame.  There is a photopenic rim surrounding the gallbladder which corresponds to the gallbladder wall thickening.  The etiology of this gallbladder wall thickening is indeterminate.  Clinical and laboratory correlation recommended.   Original Report Authenticated By: Lacy Duverney, M.D.    US Abdomen Complete  03/28/2012  *RADIOLOGY REPORT*  Clinical Data:  67 year old male with elevated LFTs and fever. Recent hemorrhoid surgery.  ABDOMINAL ULTRASOUND COMPLETE  Comparison:  08/06/2010 CT  Findings:  Gallbladder:  Gallbladder wall thickening is present. There is no evidence of cholelithiasis, pericholecystic fluid or sonographic Murphy's sign.  Common Bile Duct:  There is no evidence of intrahepatic or extrahepatic biliary dilation. The CBD measures 2.4 mm in greatest diameter.  Liver:  The liver is within normal limits in parenchymal echogenicity. No focal abnormalities are identified.  IVC:  Appears normal.  Pancreas:  Although the pancreas is difficult to visualize in its entirety, no focal pancreatic abnormality is identified.  Spleen:  Within normal limits in size and echotexture.  Right kidney:  The right kidney is normal in size and parenchymal echogenicity.  There is no evidence of solid mass, hydronephrosis or  definite renal calculi.  The right kidney measures 11.5 cm.  Left kidney:  The left kidney is normal in size and parenchymal echogenicity.  There is no evidence of solid mass, hydronephrosis or definite renal calculi.   The left kidney measures 11.5 cm.  Abdominal Aorta:  No abdominal aortic aneurysm identified.  There is no evidence of ascites.   IMPRESSION: Gallbladder wall thickening without evidence of cholelithiasis or other signs of cholecystitis.  This wall thickening is likely related to hepatic dysfunction, but if there is strong clinical suspicion for acute cholecystitis, consider nuclear medicine study.  No other significant abnormalities identified.  Liver appears unremarkable sonographically.   Original Report Authenticated By: Harmon Pier, M.D.    Ct Abdomen Pelvis W Contrast  03/28/2012  *RADIOLOGY REPORT*  Clinical Data: Hemorrhoid surgery last week.  Fevers to 1/4 over the weekend.  Intermittent chills and fevers.  Loose stools.  CT ABDOMEN AND PELVIS WITH CONTRAST  Technique:  Multidetector CT imaging of the abdomen and pelvis was performed following the standard protocol during bolus administration of intravenous contrast.  Contrast: 80mL OMNIPAQUE IOHEXOL 300 MG/ML  SOLN  Comparison: CT abdomen and pelvis 08/06/2010 from [facility].  Findings: There is atelectasis in the lung bases, greater on the right.  Focal sub centimeter low attenuation lesions in the dome of the liver likely represent small cyst and appears stable since previous study.  There is mild gallbladder wall thickening and pericholecystic edema with a stone in the gallbladder neck.  No bile duct dilatation.  This is new since the previous study and may represent developing inflammatory change in the gallbladder. Correlate clinically for cholecystitis.  Spleen size is normal. The pancreas, adrenal glands, kidneys, inferior vena cava, and retroperitoneal lymph nodes are unremarkable.  Calcification of the abdominal aorta without aneurysm.  The stomach, small bowel, and colon are decompressed.  No free air or free fluid in the abdomen. Abdominal wall musculature appears intact.  Pelvis:  Diffuse enlargement of the prostate gland, measuring 6.2 x 4.7 cm.  Bladder wall is not thickened.  Small left inguinal hernia containing fat.  Scattered diverticula in the sigmoid  colon without diverticulitis.  No free or loculated pelvic fluid collections. The appendix is not identified.  Degenerative changes in the lumbar spine with normal alignment.  IMPRESSION: Small stone in the gallbladder neck with mild gallbladder wall thickening pericholecystic edema.  Findings are nonspecific but could represent early changes of cholecystitis.  Diffuse enlargement prostate gland.  Small left inguinal hernia containing fat. No evidence of bowel obstruction or ileus.  Atelectasis in the lung bases.   Original Report Authenticated By: Burman Nieves, M.D.     Anti-infectives: Anti-infectives   Start     Dose/Rate Route Frequency Ordered Stop   03/29/12 1200  vancomycin (VANCOCIN) IVPB 1000 mg/200 mL premix     1,000 mg 200 mL/hr over 60 Minutes Intravenous Every 12 hours 03/29/12 0559     03/29/12 0600  piperacillin-tazobactam (ZOSYN) IVPB 3.375 g     3.375 g 12.5 mL/hr over 240 Minutes Intravenous 3 times per day 03/28/12 2310     03/28/12 2315  vancomycin (VANCOCIN) IVPB 1000 mg/200 mL premix     1,000 mg 200 mL/hr over 60 Minutes Intravenous  Once 03/28/12 2308 03/29/12 0122   03/28/12 1900  piperacillin-tazobactam (ZOSYN) IVPB 3.375 g     3.375 g 12.5 mL/hr over 240 Minutes Intravenous  Once 03/28/12 1853 03/28/12 2141      Assessment/Plan: E coli bacteremia  SIRS HTN DM Elevated LFTs  His LFTs are normalizing. Susceptibilities are still pending.Hepatitis panel was negative. currently everything is pointing toward his gallbladder as a source of his bacteremia  My recommendation is for him to continue on IV antibiotics in the hospital and then transition to oral antibiotics once susceptibilities come back. As long as he continues to improve, I am okay with him going home on oral antibiotics (Once susceptibilities are back) for about 2-1/2 weeks. I have planned for him to have laparoscopic cholecystectomy with cholangiogram possible open on Monday, April 7.  I  explained to the patient that it is possible that despite these plans he may have recurrence of fever and chills or abdominal pain which would prompt him to have to come back to the emergency room and be admitted for an earlier cholecystectomy. My preference is for him to recover a short-time from his bacteremia prior to going to the operating room  I believe the patient's symptoms are consistent with gallbladder disease.  We discussed gallbladder disease.  We discussed non-operative and operative management. We discussed the signs & symptoms of acute cholecystitis  I discussed laparoscopic cholecystectomy with IOC in detail.  The patient was shown diagrams detailing the procedure.  We discussed the risks and benefits of a laparoscopic cholecystectomy including, but not limited to bleeding, infection, injury to surrounding structures such as the intestine or liver, bile leak, retained gallstones, need to convert to an open procedure, prolonged diarrhea, blood clots such as  DVT, common bile duct injury, anesthesia risks, and possible need for additional procedures.  We discussed the typical post-operative recovery course. I explained that the likelihood of improvement of their symptoms is good. I explained that he was at higher risk for conversion and infection given his recent history.  Mary Sella. Andrey Campanile, MD, FACS General, Bariatric, & Minimally Invasive Surgery [facility], [place]    LOS: 2 days    Kenneth Little 03/30/2012

## 2012-03-30 NOTE — Progress Notes (Signed)
[facility] Progress Note  Subjective:  Feels fine.  No abdominal pain.  Wants to go home ASAP.  LFT's trending down.  Had a formed BM with a small amount of blood this AM.  Objective:  Vital signs in last 24 hours: Temp:  [98 F (36.7 C)-99.3 F (37.4 C)] 98.1 F (36.7 C) (03/21 0800) Pulse Rate:  [59-94] 67 (03/21 0700) Resp:  [11-25] 16 (03/21 0700) BP: (95-132)/(40-66) 123/60 mmHg (03/21 0700) SpO2:  [93 %-98 %] 94 % (03/21 0700) Last BM Date: 03/28/12 General:   Alert, Well-developed, in NAD Heart:  Regular rate and rhythm; no murmurs Pulm:  CTAB.  No W/R/R. Abdomen:  Soft, non-tender and non-distended. Normal bowel sounds, without guarding, and without rebound.   Extremities:  Without edema. Neurologic:  Alert and  oriented x4;  grossly normal neurologically. Psych:  Alert and cooperative. Normal mood and affect.  Intake/Output from previous day: 03/20 0701 - 03/21 0700 In: 3100 [P.O.:300; I.V.:2250; IV Piggyback:550] Out: 2575 [Urine:2575]  Lab Results:  Recent Labs  03/29/12 0001 03/29/12 0630 03/30/12 0335  WBC 10.4 10.4 9.3  HGB 12.1* 12.8* 12.1*  HCT 35.9* 37.5* 35.9*  PLT 153 148* 162   BMET  Recent Labs  03/28/12 1600 03/29/12 0001 03/29/12 0630 03/30/12 0335  NA 137  --  135 134*  K 4.0  --  3.1* 3.6  CL 95*  --  100 102  CO2 28  --  25 25  GLUCOSE 204*  --  89 112*  BUN 21  --  18 19  CREATININE 0.99 1.22 1.01 0.95  CALCIUM 9.5  --  8.4 8.4   LFT  Recent Labs  03/29/12 0630 03/30/12 0335  PROT 5.6* 5.5*  ALBUMIN 2.3* 2.2*  AST 360* 153*  ALT 358* 248*  ALKPHOS 269* 250*  BILITOT 2.6* 1.2  BILIDIR 2.1*  --   IBILI 0.5  --    PT/INR  Recent Labs  03/29/12 0001  LABPROT 13.0  INR 0.99   Hepatitis Panel  Recent Labs  03/28/12 1610  HEPBSAG NEGATIVE  HCVAB NEGATIVE  HEPAIGM PENDING  HEPBIGM PENDING    Dg Chest 2 View  03/28/2012  *RADIOLOGY REPORT*  Clinical Data: Fever and chills.  CHEST - 2 VIEW   Comparison: Two-view chest 02/26/2012.  Findings: The heart size is normal.  The lung volumes are low. Minimal bibasilar atelectasis is stable.  Mild degenerative endplate change and exaggerated kyphosis is stable.  IMPRESSION:  1.  Low lung volumes. 2.  No acute cardiopulmonary disease.   Original Report Authenticated By: Marin Roberts, M.D.    Nm Hepatobiliary Liver Func  03/29/2012  *RADIOLOGY REPORT*  Clinical Data: Abnormal ultrasound with gallbladder wall thickening.  NUCLEAR MEDICINE HEPATOHBILIARY INCLUDE GB  Radiopharmaceutical:  5.1 mCi technetium Choletec.  Comparison: CT and ultrasound 03/28/2012.  Findings: Homogeneous uptake by the liver.  The gallbladder is visualized by a 20 minutes.  Small bowel is visualized by a 40 minutes.  There is a photopenic rim surrounding the gallbladder which corresponds to the gallbladder wall thickening.  The etiology of this gallbladder wall thickening is indeterminate.  Clinical and laboratory correlation recommended.  IMPRESSION: Gallbladder and small bowel visualized in appropriate time frame.  There is a photopenic rim surrounding the gallbladder which corresponds to the gallbladder wall thickening.  The etiology of this gallbladder wall thickening is indeterminate.  Clinical and laboratory correlation recommended.   Original Report Authenticated By: Lacy Duverney, M.D.    US Abdomen  Complete  03/28/2012  *RADIOLOGY REPORT*  Clinical Data:  67 year old male with elevated LFTs and fever. Recent hemorrhoid surgery.  ABDOMINAL ULTRASOUND COMPLETE  Comparison:  08/06/2010 CT  Findings:  Gallbladder:  Gallbladder wall thickening is present. There is no evidence of cholelithiasis, pericholecystic fluid or sonographic Murphy's sign.  Common Bile Duct:  There is no evidence of intrahepatic or extrahepatic biliary dilation. The CBD measures 2.4 mm in greatest diameter.  Liver:  The liver is within normal limits in parenchymal echogenicity. No focal abnormalities  are identified.  IVC:  Appears normal.  Pancreas:  Although the pancreas is difficult to visualize in its entirety, no focal pancreatic abnormality is identified.  Spleen:  Within normal limits in size and echotexture.  Right kidney:  The right kidney is normal in size and parenchymal echogenicity.  There is no evidence of solid mass, hydronephrosis or definite renal calculi.  The right kidney measures 11.5 cm.  Left kidney:  The left kidney is normal in size and parenchymal echogenicity.  There is no evidence of solid mass, hydronephrosis or definite renal calculi.   The left kidney measures 11.5 cm.  Abdominal Aorta:  No abdominal aortic aneurysm identified.  There is no evidence of ascites.  IMPRESSION: Gallbladder wall thickening without evidence of cholelithiasis or other signs of cholecystitis.  This wall thickening is likely related to hepatic dysfunction, but if there is strong clinical suspicion for acute cholecystitis, consider nuclear medicine study.  No other significant abnormalities identified.  Liver appears unremarkable sonographically.   Original Report Authenticated By: Harmon Pier, M.D.    Ct Abdomen Pelvis W Contrast  03/28/2012  *RADIOLOGY REPORT*  Clinical Data: Hemorrhoid surgery last week.  Fevers to 1/4 over the weekend.  Intermittent chills and fevers.  Loose stools.  CT ABDOMEN AND PELVIS WITH CONTRAST  Technique:  Multidetector CT imaging of the abdomen and pelvis was performed following the standard protocol during bolus administration of intravenous contrast.  Contrast: 80mL OMNIPAQUE IOHEXOL 300 MG/ML  SOLN  Comparison: CT abdomen and pelvis 08/06/2010 from [facility].  Findings: There is atelectasis in the lung bases, greater on the right.  Focal sub centimeter low attenuation lesions in the dome of the liver likely represent small cyst and appears stable since previous study.  There is mild gallbladder wall thickening and pericholecystic edema with a stone in the gallbladder  neck.  No bile duct dilatation.  This is new since the previous study and may represent developing inflammatory change in the gallbladder. Correlate clinically for cholecystitis.  Spleen size is normal. The pancreas, adrenal glands, kidneys, inferior vena cava, and retroperitoneal lymph nodes are unremarkable.  Calcification of the abdominal aorta without aneurysm.  The stomach, small bowel, and colon are decompressed.  No free air or free fluid in the abdomen. Abdominal wall musculature appears intact.  Pelvis:  Diffuse enlargement of the prostate gland, measuring 6.2 x 4.7 cm.  Bladder wall is not thickened.  Small left inguinal hernia containing fat.  Scattered diverticula in the sigmoid colon without diverticulitis.  No free or loculated pelvic fluid collections. The appendix is not identified.  Degenerative changes in the lumbar spine with normal alignment.  IMPRESSION: Small stone in the gallbladder neck with mild gallbladder wall thickening pericholecystic edema.  Findings are nonspecific but could represent early changes of cholecystitis.  Diffuse enlargement prostate gland.  Small left inguinal hernia containing fat. No evidence of bowel obstruction or ileus.  Atelectasis in the lung bases.   Original Report Authenticated By:  Burman Nieves, M.D.     Assessment / Plan: -Fever with hypotension and gram-negative rod bacteremia in a patient with some non-specific abnormal gallbladder findings on CT scan, U/S, and HIDA scan. Likely gallbladder etiology/biliary sepsis; no other source to explain these findings.  LFT's are trending down; continue to monitor.  No plans for immediate cholecystectomy per surgery.  Continue abx and follow-up with surgery for possible outpatient lap chole.  Final viral hepatitis panel is pending, but so far is negative.  *Will sign off from a GI standpoint.  Call if needed.    LOS: 2 days   Kenneth Iams D.  03/30/2012, 9:53 AM  Pager number [phone]

## 2012-03-30 NOTE — Progress Notes (Signed)
Patient ID: Kenneth Little, male   DOB: 07/03/1945, 67 y.o.   MRN: [id number]  General Surgery - [facility] - Progress Note  Subjective: Patient comfortable this AM.  Waiting to eat breakfast.  No fever overnight.  Denies abdominal pain.  Objective: Vital signs in last 24 hours: Temp:  [98 F (36.7 C)-99.3 F (37.4 C)] 98 F (36.7 C) (03/21 0400) Pulse Rate:  [59-94] 67 (03/21 0700) Resp:  [11-25] 16 (03/21 0700) BP: (95-132)/(40-66) 123/60 mmHg (03/21 0700) SpO2:  [93 %-99 %] 94 % (03/21 0700) Last BM Date: 03/28/12  Intake/Output from previous day: 03/20 0701 - 03/21 0700 In: 3100 [P.O.:300; I.V.:2250; IV Piggyback:550] Out: 2575 [Urine:2575]  Exam: HEENT - clear, not icteric Neck - soft Chest - clear bilaterally Cor - RRR, no murmur Abd - soft without distension; RUQ without mass, non-tender Ext - no significant edema Neuro - grossly intact, no focal deficits  Lab Results:   Recent Labs  03/29/12 0630 03/30/12 0335  WBC 10.4 9.3  HGB 12.8* 12.1*  HCT 37.5* 35.9*  PLT 148* 162     Recent Labs  03/29/12 0630 03/30/12 0335  NA 135 134*  K 3.1* 3.6  CL 100 102  CO2 25 25  GLUCOSE 89 112*  BUN 18 19  CREATININE 1.01 0.95  CALCIUM 8.4 8.4    Studies/Results: Dg Chest 2 View  03/28/2012  *RADIOLOGY REPORT*  Clinical Data: Fever and chills.  CHEST - 2 VIEW  Comparison: Two-view chest 02/26/2012.  Findings: The heart size is normal.  The lung volumes are low. Minimal bibasilar atelectasis is stable.  Mild degenerative endplate change and exaggerated kyphosis is stable.  IMPRESSION:  1.  Low lung volumes. 2.  No acute cardiopulmonary disease.   Original Report Authenticated By: Marin Roberts, M.D.    Nm Hepatobiliary Liver Func  03/29/2012  *RADIOLOGY REPORT*  Clinical Data: Abnormal ultrasound with gallbladder wall thickening.  NUCLEAR MEDICINE HEPATOHBILIARY INCLUDE GB  Radiopharmaceutical:  5.1 mCi technetium Choletec.  Comparison: CT  and ultrasound 03/28/2012.  Findings: Homogeneous uptake by the liver.  The gallbladder is visualized by a 20 minutes.  Small bowel is visualized by a 40 minutes.  There is a photopenic rim surrounding the gallbladder which corresponds to the gallbladder wall thickening.  The etiology of this gallbladder wall thickening is indeterminate.  Clinical and laboratory correlation recommended.  IMPRESSION: Gallbladder and small bowel visualized in appropriate time frame.  There is a photopenic rim surrounding the gallbladder which corresponds to the gallbladder wall thickening.  The etiology of this gallbladder wall thickening is indeterminate.  Clinical and laboratory correlation recommended.   Original Report Authenticated By: Lacy Duverney, M.D.    US Abdomen Complete  03/28/2012  *RADIOLOGY REPORT*  Clinical Data:  67 year old male with elevated LFTs and fever. Recent hemorrhoid surgery.  ABDOMINAL ULTRASOUND COMPLETE  Comparison:  08/06/2010 CT  Findings:  Gallbladder:  Gallbladder wall thickening is present. There is no evidence of cholelithiasis, pericholecystic fluid or sonographic Murphy's sign.  Common Bile Duct:  There is no evidence of intrahepatic or extrahepatic biliary dilation. The CBD measures 2.4 mm in greatest diameter.  Liver:  The liver is within normal limits in parenchymal echogenicity. No focal abnormalities are identified.  IVC:  Appears normal.  Pancreas:  Although the pancreas is difficult to visualize in its entirety, no focal pancreatic abnormality is identified.  Spleen:  Within normal limits in size and echotexture.  Right kidney:  The right kidney is normal  in size and parenchymal echogenicity.  There is no evidence of solid mass, hydronephrosis or definite renal calculi.  The right kidney measures 11.5 cm.  Left kidney:  The left kidney is normal in size and parenchymal echogenicity.  There is no evidence of solid mass, hydronephrosis or definite renal calculi.   The left kidney measures  11.5 cm.  Abdominal Aorta:  No abdominal aortic aneurysm identified.  There is no evidence of ascites.  IMPRESSION: Gallbladder wall thickening without evidence of cholelithiasis or other signs of cholecystitis.  This wall thickening is likely related to hepatic dysfunction, but if there is strong clinical suspicion for acute cholecystitis, consider nuclear medicine study.  No other significant abnormalities identified.  Liver appears unremarkable sonographically.   Original Report Authenticated By: Harmon Pier, M.D.    Ct Abdomen Pelvis W Contrast  03/28/2012  *RADIOLOGY REPORT*  Clinical Data: Hemorrhoid surgery last week.  Fevers to 1/4 over the weekend.  Intermittent chills and fevers.  Loose stools.  CT ABDOMEN AND PELVIS WITH CONTRAST  Technique:  Multidetector CT imaging of the abdomen and pelvis was performed following the standard protocol during bolus administration of intravenous contrast.  Contrast: 80mL OMNIPAQUE IOHEXOL 300 MG/ML  SOLN  Comparison: CT abdomen and pelvis 08/06/2010 from [facility].  Findings: There is atelectasis in the lung bases, greater on the right.  Focal sub centimeter low attenuation lesions in the dome of the liver likely represent small cyst and appears stable since previous study.  There is mild gallbladder wall thickening and pericholecystic edema with a stone in the gallbladder neck.  No bile duct dilatation.  This is new since the previous study and may represent developing inflammatory change in the gallbladder. Correlate clinically for cholecystitis.  Spleen size is normal. The pancreas, adrenal glands, kidneys, inferior vena cava, and retroperitoneal lymph nodes are unremarkable.  Calcification of the abdominal aorta without aneurysm.  The stomach, small bowel, and colon are decompressed.  No free air or free fluid in the abdomen. Abdominal wall musculature appears intact.  Pelvis:  Diffuse enlargement of the prostate gland, measuring 6.2 x 4.7 cm.  Bladder wall  is not thickened.  Small left inguinal hernia containing fat.  Scattered diverticula in the sigmoid colon without diverticulitis.  No free or loculated pelvic fluid collections. The appendix is not identified.  Degenerative changes in the lumbar spine with normal alignment.  IMPRESSION: Small stone in the gallbladder neck with mild gallbladder wall thickening pericholecystic edema.  Findings are nonspecific but could represent early changes of cholecystitis.  Diffuse enlargement prostate gland.  Small left inguinal hernia containing fat. No evidence of bowel obstruction or ileus.  Atelectasis in the lung bases.   Original Report Authenticated By: Burman Nieves, M.D.     Assessment / Plan: 1.  Fever  Remains afebrile on IV Vanco and Zosyn  WBC normalized  Benign abdominal and rectal exam 2.  Elevated LFT's  Improving - Tbili normal today, transaminases improving 3.  Status post hemorrhoidectomy  No clinical issues at present  Will follow.  Dr. Andrey Campanile to see back in follow up and consider cholecystectomy at some point.   Velora Heckler, MD, [facility] Office: [phone]  03/30/2012

## 2012-03-31 LAB — COMPREHENSIVE METABOLIC PANEL
ALT: 185 U/L — ABNORMAL HIGH (ref 0–53)
AST: 67 U/L — ABNORMAL HIGH (ref 0–37)
CO2: 28 mEq/L (ref 19–32)
Chloride: 104 mEq/L (ref 96–112)
GFR calc non Af Amer: 90 mL/min — ABNORMAL LOW (ref >90)
Sodium: 140 mEq/L (ref 135–145)
Total Bilirubin: 0.7 mg/dL (ref 0.3–1.2)

## 2012-03-31 LAB — CULTURE, BLOOD (ROUTINE X 2)
Culture: NO GROWTH
Culture: NO GROWTH

## 2012-03-31 LAB — CBC
Hemoglobin: 13.2 g/dL (ref 13.0–17.0)
MCV: 88.8 fL (ref 78.0–100.0)
Platelets: 219 10*3/uL (ref 150–400)
RBC: 4.55 MIL/uL (ref 4.22–5.81)
WBC: 7.1 10*3/uL (ref 4.0–10.5)

## 2012-03-31 LAB — GLUCOSE, CAPILLARY: Glucose-Capillary: 143 mg/dL — ABNORMAL HIGH (ref 70–99)

## 2012-03-31 MED ORDER — HEPARIN SOD (PORK) LOCK FLUSH 100 UNIT/ML IV SOLN
250.0000 [IU] | INTRAVENOUS | Status: AC | PRN
  Administered 2012-03-31: 250 [IU]
  Filled 2012-03-31: qty 5

## 2012-03-31 MED ORDER — SODIUM CHLORIDE 0.9 % IV SOLN
1.0000 g | INTRAVENOUS | Status: DC
  Administered 2012-03-31: 1 g via INTRAVENOUS
  Filled 2012-03-31: qty 1

## 2012-03-31 MED ORDER — SODIUM CHLORIDE 0.9 % IV SOLN: 1.0000 g | INTRAVENOUS | Status: AC

## 2012-03-31 NOTE — Progress Notes (Signed)
Peripherally Inserted Central Catheter/Midline Placement  The IV Nurse has discussed with the patient and/or persons authorized to consent for the patient, the purpose of this procedure and the potential benefits and risks involved with this procedure.  The benefits include less needle sticks, lab draws from the catheter and patient may be discharged home with the catheter.  Risks include, but not limited to, infection, bleeding, blood clot (thrombus formation), and puncture of an artery; nerve damage and irregular heat beat.  Alternatives to this procedure were also discussed.  PICC/Midline Placement Documentation  PICC / Midline Single Lumen 03/31/12 PICC Right Cephalic (Active)       Kenneth Little 03/31/2012, 4:56 PM

## 2012-03-31 NOTE — Discharge Summary (Signed)
Physician Discharge Summary  PARK BECK VWU:[id number] DOB: Aug 09, 1945 DOA: 03/28/2012  PCP: Rogelia Boga, MD  Admit date: 03/28/2012 Discharge date: 03/31/2012  Recommendations for Outpatient Follow-up:   Continue ertapenem 1 gm daily IV for next 11 days, end date 04/11/2012  Discharge Diagnoses:  Principal Problem:   Sepsis Active Problems:   Diabetes mellitus   Hypercholesterolemia   Hypertension   Leucopenia   LFT elevation  Discharge Condition: medically stable for discharge home today with PICC line for IV ertapenem  Diet recommendation: as tolerated, diabetic  History of present illness:  66 y.o. male with past medical history significant for hypertension, diabetes, dyslipidemia and recent hemorrhoidectomy who presented to [facility] ED 03/28/2012 for persistent fever and chills. Patient was in ED 4 days prior to this admission due to fever and workup included CT chest angiogram which was negative and patient was subsequently discharged home.  In ED, patient was tachycardic and hypotensive along with the temperature of 102.9 F. His work up further revealed elevated liver enzymes. Patient had HIDA scan which revealed gallbladder wall thickening but no obvious cholelithiasis (Abdominal US showed the same results). CT abdomen showed gallbladder wall thickening with pericholecystic edema, findings nonspecific but may be suggestive of early cholecystitis. Blood cultures are growing E.Coli.  Plan for surgery 04/16/2012.  Assessment/Plan:   Principal Problem:  Fever, sepsis, gram negative bacteremia  E.Coli bacteremia / sepsis in the setting of elevated procalcitonin level, 46.69. Procalcitonin level trending down.  Spoke with ID and recommendation was to have patient on ertapenem for 11 days upon discharge. PICC line to be placed prior to discharge. Lactic acid is within normal limits.  Appreciate surgery and GI following.  Acute hepatitis panel is WNL.  Active Problems:   Abnormal LFT's  Possible gallbladder etiology although HIDA scan only shows gallbladder wall thickening. Abdominal US shows the same without cholelithiasis or evidence of cholecystitis.  Liver enzymes are steadily trending down. Plan for surgery 04/16/2012 per surgery schedule Diabetes mellitus  Continue Lantus 20 units Q HS, continue sliding scale insulin  Continue tradjenta and glipizide  Hypokalemia  Secondary to GI losses  Repleted. Potassium is WNL    Code Status: full code  Family Communication: at bedside  Disposition Plan: home today   Manson Passey, MD  [facility]  Pager [phone]   Consultants:  Surgery  Gastroenterology Procedures:  None  Antibiotics:  Ertapenem 1 gm Q 24 hours IV 03/31/2012 --> 04/11/2012 Vancomycin 03/28/2012 --> 03/31/12 Zosyn 03/28/2012 -->03/31/12     Discharge Exam: Filed Vitals:   03/31/12 0545  BP: 113/63  Pulse: 59  Temp: 98.6 F (37 C)  Resp: 16   Filed Vitals:   03/30/12 1222 03/30/12 1625 03/30/12 2051 03/31/12 0545  BP: 130/70 126/62 118/69 113/63  Pulse: 67 77 85 59  Temp: 97.9 F (36.6 C) 98.3 F (36.8 C) 98.3 F (36.8 C) 98.6 F (37 C)  TempSrc: Oral Oral Oral Oral  Resp:  18 18 16   Height:  5' 7.5" (1.715 m)    Weight:  81.602 kg (179 lb 14.4 oz)  79.969 kg (176 lb 4.8 oz)  SpO2: 98% 95% 95% 94%    General: Pt is alert, follows commands appropriately, not in acute distress Cardiovascular: Regular rate and rhythm, S1/S2 +, no murmurs, no rubs, no gallops Respiratory: Clear to auscultation bilaterally, no wheezing, no crackles, no rhonchi Abdominal: Soft, non tender, non distended, bowel sounds +, no guarding Extremities: no edema, no cyanosis, pulses palpable bilaterally DP and PT  Neuro: Grossly nonfocal  Discharge Instructions  Discharge Orders   Future Appointments Provider Department Dept Phone   04/12/2012 8:45 AM Atilano Ina, MD [facility], [place] [phone]   05/16/2012 8:30 AM Atilano Ina, MD [facility], [place] [phone]   Future Orders Complete By Expires     Call MD for:  difficulty breathing, headache or visual disturbances  As directed     Call MD for:  persistant dizziness or light-headedness  As directed     Call MD for:  persistant nausea and vomiting  As directed     Call MD for:  severe uncontrolled pain  As directed     Diet - low sodium heart healthy  As directed     Discharge instructions  As directed     Comments:      Continue ertapenem 1 gm daily IV for next 11 days, end date 04/11/2012    Increase activity slowly  As directed         Medication List    TAKE these medications       acetaminophen 500 MG tablet  Commonly known as:  TYLENOL  Take 500 mg by mouth every 6 (six) hours as needed for pain. For pain     aspirin EC 81 MG tablet  Take 81 mg by mouth daily.     atorvastatin 40 MG tablet  Commonly known as:  LIPITOR  Take 40 mg by mouth daily.     ergocalciferol 50000 UNITS capsule  Commonly known as:  VITAMIN D2  Take 50,000 Units by mouth once a week. Thursday     glipiZIDE 10 MG 24 hr tablet  Commonly known as:  GLUCOTROL XL  Take 10 mg by mouth daily.     indapamide 2.5 MG tablet  Commonly known as:  LOZOL  Take 2.5 mg by mouth every morning.     insulin glargine 100 UNIT/ML injection  Commonly known as:  LANTUS  Inject 20 Units into the skin at bedtime.     INVOKANA 300 MG Tabs  Generic drug:  Canagliflozin  Take 1 capsule by mouth daily.     metFORMIN 1000 MG (MOD) 24 hr tablet  Commonly known as:  GLUMETZA  Take 1,000 mg by mouth daily with breakfast.     oxyCODONE-acetaminophen 5-325 MG per tablet  Commonly known as:  PERCOCET/ROXICET  Take 1 tablet by mouth every 4 (four) hours as needed for pain. For pain     pioglitazone 45 MG tablet  Commonly known as:  ACTOS  Take 45 mg by mouth daily.     sitaGLIPtin 100 MG tablet  Commonly known as:  JANUVIA  Take 100 mg by mouth daily.     sodium chloride 0.9 % SOLN 50  mL with ertapenem 1 G SOLR 1 g  Inject 1 g into the vein daily.     tamsulosin 0.4 MG Caps  Commonly known as:  FLOMAX  Take 1 capsule (0.4 mg total) by mouth daily. Start taking 2 days before hemorrhoid surgery     valsartan 320 MG tablet  Commonly known as:  DIOVAN  Take 320 mg by mouth daily.     vitamin E 400 UNIT capsule  Take 400 Units by mouth daily.           Follow-up Information   Follow up with Rogelia Boga, MD In 2 weeks.   Contact information:   [address] [place] [address] [phone]  The results of significant diagnostics from this hospitalization (including imaging, microbiology, ancillary and laboratory) are listed below for reference.    Significant Diagnostic Studies: Dg Chest 2 View  03/28/2012  *RADIOLOGY REPORT*  Clinical Data: Fever and chills.  CHEST - 2 VIEW  Comparison: Two-view chest 02/26/2012.  Findings: The heart size is normal.  The lung volumes are low. Minimal bibasilar atelectasis is stable.  Mild degenerative endplate change and exaggerated kyphosis is stable.  IMPRESSION:  1.  Low lung volumes. 2.  No acute cardiopulmonary disease.   Original Report Authenticated By: Marin Roberts, M.D.    Dg Chest 2 View  03/25/2012  *RADIOLOGY REPORT*  Clinical Data: Postop 5 days.  Fever.  CHEST - 2 VIEW  Comparison: None.  Findings: Shallow inspiration.  Normal heart size and pulmonary vascularity.  Mild linear atelectasis in the lung bases.  No focal consolidation or airspace disease.  No blunting of costophrenic angles.  No pneumothorax.  Mediastinal contours appear intact. Calcification of the aorta.  IMPRESSION: Shallow inspiration with linear atelectasis in the lung bases.  No focal consolidation.   Original Report Authenticated By: Burman Nieves, M.D.    Ct Angio Chest W/cm &/or Wo Cm  03/25/2012  *RADIOLOGY REPORT*  Clinical Data: Postoperative fever and hypoxia.  CT ANGIOGRAPHY CHEST  Technique:   Multidetector CT imaging of the chest using the standard protocol during bolus administration of intravenous contrast. Multiplanar reconstructed images including MIPs were obtained and reviewed to evaluate the vascular anatomy.  Contrast: OMNIPAQUE IOHEXOL 350 MG/ML SOLN  Comparison: None.  Findings: Technically adequate study with good opacification of the central and segmental pulmonary arteries.  No focal filling defect. No evidence of significant pulmonary embolus.  Normal heart size.  Normal caliber thoracic aorta with calcification.  No significant lymphadenopathy in the chest.  No abnormal mediastinal fluid collections.  The esophagus is mostly decompressed.  Visualized portions of the upper abdominal organs are grossly unremarkable.  Mild atelectasis in the lung bases. Focal linear atelectasis in the right mid lung.  No pneumothorax. Mild emphysematous changes in the upper lungs.  No pleural effusions.  Normal alignment of the thoracic vertebrae.  IMPRESSION: No evidence of significant pulmonary embolus.  Mild atelectasis in the lungs.   Original Report Authenticated By: Burman Nieves, M.D.    Nm Hepatobiliary Liver Func  03/29/2012  *RADIOLOGY REPORT*  Clinical Data: Abnormal ultrasound with gallbladder wall thickening.  NUCLEAR MEDICINE HEPATOHBILIARY INCLUDE GB  Radiopharmaceutical:  5.1 mCi technetium Choletec.  Comparison: CT and ultrasound 03/28/2012.  Findings: Homogeneous uptake by the liver.  The gallbladder is visualized by a 20 minutes.  Small bowel is visualized by a 40 minutes.  There is a photopenic rim surrounding the gallbladder which corresponds to the gallbladder wall thickening.  The etiology of this gallbladder wall thickening is indeterminate.  Clinical and laboratory correlation recommended.  IMPRESSION: Gallbladder and small bowel visualized in appropriate time frame.  There is a photopenic rim surrounding the gallbladder which corresponds to the gallbladder wall thickening.   The etiology of this gallbladder wall thickening is indeterminate.  Clinical and laboratory correlation recommended.   Original Report Authenticated By: Lacy Duverney, M.D.    US Abdomen Complete  03/28/2012  *RADIOLOGY REPORT*  Clinical Data:  67 year old male with elevated LFTs and fever. Recent hemorrhoid surgery.  ABDOMINAL ULTRASOUND COMPLETE  Comparison:  08/06/2010 CT  Findings:  Gallbladder:  Gallbladder wall thickening is present. There is no evidence of cholelithiasis, pericholecystic fluid or sonographic Murphy's sign.  Common Bile Duct:  There is no evidence of intrahepatic or extrahepatic biliary dilation. The CBD measures 2.4 mm in greatest diameter.  Liver:  The liver is within normal limits in parenchymal echogenicity. No focal abnormalities are identified.  IVC:  Appears normal.  Pancreas:  Although the pancreas is difficult to visualize in its entirety, no focal pancreatic abnormality is identified.  Spleen:  Within normal limits in size and echotexture.  Right kidney:  The right kidney is normal in size and parenchymal echogenicity.  There is no evidence of solid mass, hydronephrosis or definite renal calculi.  The right kidney measures 11.5 cm.  Left kidney:  The left kidney is normal in size and parenchymal echogenicity.  There is no evidence of solid mass, hydronephrosis or definite renal calculi.   The left kidney measures 11.5 cm.  Abdominal Aorta:  No abdominal aortic aneurysm identified.  There is no evidence of ascites.  IMPRESSION: Gallbladder wall thickening without evidence of cholelithiasis or other signs of cholecystitis.  This wall thickening is likely related to hepatic dysfunction, but if there is strong clinical suspicion for acute cholecystitis, consider nuclear medicine study.  No other significant abnormalities identified.  Liver appears unremarkable sonographically.   Original Report Authenticated By: Harmon Pier, M.D.    Ct Abdomen Pelvis W Contrast  03/28/2012   *RADIOLOGY REPORT*  Clinical Data: Hemorrhoid surgery last week.  Fevers to 1/4 over the weekend.  Intermittent chills and fevers.  Loose stools.  CT ABDOMEN AND PELVIS WITH CONTRAST  Technique:  Multidetector CT imaging of the abdomen and pelvis was performed following the standard protocol during bolus administration of intravenous contrast.  Contrast: 80mL OMNIPAQUE IOHEXOL 300 MG/ML  SOLN  Comparison: CT abdomen and pelvis 08/06/2010 from [facility].  Findings: There is atelectasis in the lung bases, greater on the right.  Focal sub centimeter low attenuation lesions in the dome of the liver likely represent small cyst and appears stable since previous study.  There is mild gallbladder wall thickening and pericholecystic edema with a stone in the gallbladder neck.  No bile duct dilatation.  This is new since the previous study and may represent developing inflammatory change in the gallbladder. Correlate clinically for cholecystitis.  Spleen size is normal. The pancreas, adrenal glands, kidneys, inferior vena cava, and retroperitoneal lymph nodes are unremarkable.  Calcification of the abdominal aorta without aneurysm.  The stomach, small bowel, and colon are decompressed.  No free air or free fluid in the abdomen. Abdominal wall musculature appears intact.  Pelvis:  Diffuse enlargement of the prostate gland, measuring 6.2 x 4.7 cm.  Bladder wall is not thickened.  Small left inguinal hernia containing fat.  Scattered diverticula in the sigmoid colon without diverticulitis.  No free or loculated pelvic fluid collections. The appendix is not identified.  Degenerative changes in the lumbar spine with normal alignment.  IMPRESSION: Small stone in the gallbladder neck with mild gallbladder wall thickening pericholecystic edema.  Findings are nonspecific but could represent early changes of cholecystitis.  Diffuse enlargement prostate gland.  Small left inguinal hernia containing fat. No evidence of bowel  obstruction or ileus.  Atelectasis in the lung bases.   Original Report Authenticated By: Burman Nieves, M.D.     Microbiology: Recent Results (from the past 240 hour(s))  CULTURE, BLOOD (ROUTINE X 2)     Status: None   Collection Time    03/25/12  1:15 AM      Result Value Range Status   Specimen Description BLOOD  RIGHT HAND   Final   Special Requests BOTTLES DRAWN AEROBIC AND ANAEROBIC [facility] EACH   Final   Culture  Setup Time 03/25/2012 15:03   Final   Culture NO GROWTH 5 DAYS   Final   Report Status 03/31/2012 FINAL   Final  CULTURE, BLOOD (ROUTINE X 2)     Status: None   Collection Time    03/25/12  1:25 AM      Result Value Range Status   Specimen Description BLOOD LEFT ARM   Final   Special Requests BOTTLES DRAWN AEROBIC AND ANAEROBIC 5C EACH   Final   Culture  Setup Time 03/25/2012 15:03   Final   Culture NO GROWTH 5 DAYS   Final   Report Status 03/31/2012 FINAL   Final  CULTURE, BLOOD (ROUTINE X 2)     Status: None   Collection Time    03/28/12  3:50 PM      Result Value Range Status   Specimen Description BLOOD RIGHT ARM   Final   Special Requests BOTTLES DRAWN AEROBIC ONLY 4CC   Final   Culture  Setup Time 03/28/2012 21:11   Final   Culture     Final   Value: ESCHERICHIA COLI     Note: Confirmed Extended Spectrum Beta-Lactamase Producer (ESBL) CRITICAL RESULT CALLED TO, READ BACK BY AND VERIFIED WITH: KAITLYN TAYLOR@1004  ON [id number] BY [facility]     Note: Gram Stain Report Called to,Read Back By and Verified With: JENNA M @0824  03/29/12 BY KRAWS   Report Status 03/31/2012 FINAL   Final   Organism ID, Bacteria ESCHERICHIA COLI   Final  CULTURE, BLOOD (ROUTINE X 2)     Status: None   Collection Time    03/28/12  4:10 PM      Result Value Range Status   Specimen Description BLOOD LEFT ARM   Final   Special Requests BOTTLES DRAWN AEROBIC AND ANAEROBIC 4CC   Final   Culture  Setup Time 03/28/2012 21:11   Final   Culture     Final   Value: ESCHERICHIA COLI     Note:  SUSCEPTIBILITIES PERFORMED ON PREVIOUS CULTURE WITHIN THE LAST 5 DAYS.     Note: Gram Stain Report Called to,Read Back By and Verified With: JENNA M @0824  03/29/12 BY KRAWS   Report Status 03/31/2012 FINAL   Final  MRSA PCR SCREENING     Status: None   Collection Time    03/28/12 11:23 PM      Result Value Range Status   MRSA by PCR NEGATIVE  NEGATIVE Final   Comment:            The GeneXpert MRSA Assay (FDA     approved for NASAL specimens     only), is one component of a     comprehensive MRSA colonization     surveillance program. It is not     intended to diagnose MRSA     infection nor to guide or     monitor treatment for     MRSA infections.  CLOSTRIDIUM DIFFICILE BY PCR     Status: None   Collection Time    03/30/12  9:00 AM      Result Value Range Status   C difficile by pcr NEGATIVE  NEGATIVE Final     Labs: Basic Metabolic Panel:  Recent Labs Lab 03/25/12 0115 03/28/12 1600 03/29/12 0001 03/29/12 0630 03/30/12 0335 03/31/12 0404  NA 130* 137  --  135 134*  140  K 3.3* 4.0  --  3.1* 3.6 4.2  CL 95* 95*  --  100 102 104  CO2 27 28  --  25 25 28   GLUCOSE 229* 204*  --  89 112* 123*  BUN 22 21  --  18 19 13   CREATININE 1.09 0.99 1.22 1.01 0.95 0.86  CALCIUM 8.6 9.5  --  8.4 8.4 9.2   Liver Function Tests:  Recent Labs Lab 03/25/12 0115 03/28/12 1600 03/29/12 0630 03/30/12 0335 03/31/12 0404  AST 63* 252* 360* 153* 67*  ALT 86* 147* 358* 248* 185*  ALKPHOS 157* 245* 269* 250* 236*  BILITOT 0.9 1.7* 2.6* 1.2 0.7  PROT 6.0 6.9 5.6* 5.5* 6.3  ALBUMIN 3.0* 3.1* 2.3* 2.2* 2.6*    Recent Labs Lab 03/28/12 1600  LIPASE 66*   No results found for this basename: AMMONIA,  in the last 168 hours CBC:  Recent Labs Lab 03/25/12 0115 03/28/12 1600 03/29/12 0001 03/29/12 0630 03/30/12 0335 03/31/12 0404  WBC 6.0 2.2* 10.4 10.4 9.3 7.1  NEUTROABS  --  2.0  --  8.7*  --   --   HGB 12.9* 13.1 12.1* 12.8* 12.1* 13.2  HCT 37.6* 38.5* 35.9* 37.5* 35.9*  40.4  MCV 90.0 90.2 89.5 89.3 89.5 88.8  PLT 148* 173 153 148* 162 219   Cardiac Enzymes:  Recent Labs Lab 03/29/12 0001  TROPONINI <0.30   BNP: BNP (last 3 results) No results found for this basename: PROBNP,  in the last 8760 hours CBG:  Recent Labs Lab 03/30/12 1208 03/30/12 1548 03/30/12 1822 03/30/12 2143 03/31/12 0800  GLUCAP 113* 169* 99 121* 94    Time coordinating discharge: Over 30 minutes  Signed:  Manson Passey, MD  [facility]  03/31/2012, 12:03 PM  Pager #: [phone]

## 2012-03-31 NOTE — Progress Notes (Signed)
CRITICAL VALUE ALERT  Critical value received:  Blood Culture positive for E.Coli  Date of notification:  03/31/12  Time of notification:  1000  Critical value read back: Yes  Nurse who received alert: Farley Ly  MD notified (1st page):  Elisabeth Pigeon  Time of first page: 1011

## 2012-03-31 NOTE — Progress Notes (Signed)
Patient ID: Kenneth Little, male   DOB: October 15, 1945, 67 y.o.   MRN: [id number]    Subjective: Patient is without complaints today. No abdominal pain.  Had bowel movement without blood and no significant discomfort  Objective: Vital signs in last 24 hours: Temp:  [97.9 F (36.6 C)-98.6 F (37 C)] 98.6 F (37 C) (03/22 0545) Pulse Rate:  [59-85] 59 (03/22 0545) Resp:  [16-18] 16 (03/22 0545) BP: (113-130)/(62-70) 113/63 mmHg (03/22 0545) SpO2:  [94 %-98 %] 94 % (03/22 0545) Weight:  [176 lb 4.8 oz (79.969 kg)-179 lb 14.4 oz (81.602 kg)] 176 lb 4.8 oz (79.969 kg) (03/22 0545) Last BM Date: 03/30/12  Intake/Output from previous day: 03/21 0701 - 03/22 0700 In: 742 [P.O.:340; I.V.:102; IV Piggyback:300] Out: 2930 [Urine:2930] Intake/Output this shift:    General appearance: alert, cooperative and no distress GI: normal findings: soft, non-tender  Lab Results:   Recent Labs  03/30/12 0335 03/31/12 0404  WBC 9.3 7.1  HGB 12.1* 13.2  HCT 35.9* 40.4  PLT 162 219   BMET  Recent Labs  03/30/12 0335 03/31/12 0404  NA 134* 140  K 3.6 4.2  CL 102 104  CO2 25 28  GLUCOSE 112* 123*  BUN 19 13  CREATININE 0.95 0.86  CALCIUM 8.4 9.2   Recent Results (from the past 240 hour(s))  CULTURE, BLOOD (ROUTINE X 2)     Status: None   Collection Time    03/25/12  1:15 AM      Result Value Range Status   Specimen Description BLOOD RIGHT HAND   Final   Special Requests BOTTLES DRAWN AEROBIC AND ANAEROBIC 5CC EACH   Final   Culture  Setup Time 03/25/2012 15:03   Final   Culture NO GROWTH 5 DAYS   Final   Report Status 03/31/2012 FINAL   Final  CULTURE, BLOOD (ROUTINE X 2)     Status: None   Collection Time    03/25/12  1:25 AM      Result Value Range Status   Specimen Description BLOOD LEFT ARM   Final   Special Requests BOTTLES DRAWN AEROBIC AND ANAEROBIC 5C EACH   Final   Culture  Setup Time 03/25/2012 15:03   Final   Culture NO GROWTH 5 DAYS   Final   Report Status 03/31/2012  FINAL   Final  CULTURE, BLOOD (ROUTINE X 2)     Status: None   Collection Time    03/28/12  3:50 PM      Result Value Range Status   Specimen Description BLOOD RIGHT ARM   Final   Special Requests BOTTLES DRAWN AEROBIC ONLY 4CC   Final   Culture  Setup Time 03/28/2012 21:11   Final   Culture     Final   Value: ESCHERICHIA COLI     Note: Confirmed Extended Spectrum Beta-Lactamase Producer (ESBL) CRITICAL RESULT CALLED TO, READ BACK BY AND VERIFIED WITH: KAITLYN TAYLOR@1004  ON [id number] BY [facility]     Note: Gram Stain Report Called to,Read Back By and Verified With: JENNA M @0824  03/29/12 BY KRAWS   Report Status 03/31/2012 FINAL   Final   Organism ID, Bacteria ESCHERICHIA COLI   Final  CULTURE, BLOOD (ROUTINE X 2)     Status: None   Collection Time    03/28/12  4:10 PM      Result Value Range Status   Specimen Description BLOOD LEFT ARM   Final   Special Requests BOTTLES DRAWN AEROBIC AND ANAEROBIC  4CC   Final   Culture  Setup Time 03/28/2012 21:11   Final   Culture     Final   Value: ESCHERICHIA COLI     Note: SUSCEPTIBILITIES PERFORMED ON PREVIOUS CULTURE WITHIN THE LAST 5 DAYS.     Note: Gram Stain Report Called to,Read Back By and Verified With: JENNA M @0824  03/29/12 BY KRAWS   Report Status 03/31/2012 FINAL   Final  MRSA PCR SCREENING     Status: None   Collection Time    03/28/12 11:23 PM      Result Value Range Status   MRSA by PCR NEGATIVE  NEGATIVE Final   Comment:            The GeneXpert MRSA Assay (FDA     approved for NASAL specimens     only), is one component of a     comprehensive MRSA colonization     surveillance program. It is not     intended to diagnose MRSA     infection nor to guide or     monitor treatment for     MRSA infections.  CLOSTRIDIUM DIFFICILE BY PCR     Status: None   Collection Time    03/30/12  9:00 AM      Result Value Range Status   C difficile by pcr NEGATIVE  NEGATIVE Final    Studies/Results: Nm Hepatobiliary Liver Func  03/29/2012   *RADIOLOGY REPORT*  Clinical Data: Abnormal ultrasound with gallbladder wall thickening.  NUCLEAR MEDICINE HEPATOHBILIARY INCLUDE GB  Radiopharmaceutical:  5.1 mCi technetium Choletec.  Comparison: CT and ultrasound 03/28/2012.  Findings: Homogeneous uptake by the liver.  The gallbladder is visualized by a 20 minutes.  Small bowel is visualized by a 40 minutes.  There is a photopenic rim surrounding the gallbladder which corresponds to the gallbladder wall thickening.  The etiology of this gallbladder wall thickening is indeterminate.  Clinical and laboratory correlation recommended.  IMPRESSION: Gallbladder and small bowel visualized in appropriate time frame.  There is a photopenic rim surrounding the gallbladder which corresponds to the gallbladder wall thickening.  The etiology of this gallbladder wall thickening is indeterminate.  Clinical and laboratory correlation recommended.   Original Report Authenticated By: Lacy Duverney, M.D.     Anti-infectives: Anti-infectives   Start     Dose/Rate Route Frequency Ordered Stop   03/29/12 1200  vancomycin (VANCOCIN) IVPB 1000 mg/200 mL premix     1,000 mg 200 mL/hr over 60 Minutes Intravenous Every 12 hours 03/29/12 0559     03/29/12 0600  piperacillin-tazobactam (ZOSYN) IVPB 3.375 g     3.375 g 12.5 mL/hr over 240 Minutes Intravenous 3 times per day 03/28/12 2310     03/28/12 2315  vancomycin (VANCOCIN) IVPB 1000 mg/200 mL premix     1,000 mg 200 mL/hr over 60 Minutes Intravenous  Once 03/28/12 2308 03/29/12 0122   03/28/12 1900  piperacillin-tazobactam (ZOSYN) IVPB 3.375 g     3.375 g 12.5 mL/hr over 240 Minutes Intravenous  Once 03/28/12 1853 03/28/12 2141      Assessment/Plan: Status post hemorrhoidectomy. Escherichia coli bacteremia and on workup gallbladder seems most likely source. Sensitivities still appear to be pending. As per Dr. Andrey Campanile I believe he could be discharged at any time on oral antibiotics and arrangements have been made for  cholecystectomy.    LOS: 3 days    Kynzie Polgar T 03/31/2012

## 2012-03-31 NOTE — Care Management (Signed)
Cm spoke with patient with family present at bedside concerning discharge planning. Per pt choice [facility] to provide [facility] services upon discharge. CM confirmed with [facility] pharmacist Kristen agency able to provide IV medication for pt upon discharge. Start of care verified for 04/01/12. MD orders, RX, face sheet, and H/P faxed to [facility] at [phone]. Confirmation received. No other needs stated. Family teachable for home IV therapy.    Roxy Manns Ellouise Mcwhirter,RN,BSN [phone]

## 2012-04-04 ENCOUNTER — Encounter (HOSPITAL_COMMUNITY): Payer: Self-pay

## 2012-04-05 ENCOUNTER — Telehealth (INDEPENDENT_AMBULATORY_CARE_PROVIDER_SITE_OTHER): Payer: Self-pay | Admitting: General Surgery

## 2012-04-05 ENCOUNTER — Telehealth (INDEPENDENT_AMBULATORY_CARE_PROVIDER_SITE_OTHER): Payer: Self-pay | Admitting: *Deleted

## 2012-04-05 NOTE — Telephone Encounter (Signed)
Kenneth Little from [facility] called to state  Pam home health nurse has been back out to instruct  patients daughter on IV infusion and dressing change , Kenneth states patients daughter feels more comfortable now and Pam left her cell phone number with the daughter to call if she needs to .

## 2012-04-05 NOTE — Telephone Encounter (Signed)
Per [place] [facility] [facility] the prior message  After listening to voicemail again [place] stated  Pam was on the phone with the daughter and walked her through the IV infusion she was not at the home , and she felt ok with what she was doing  Pam with [facility] infusion left her cell phone number if the daughter needed to contact her .

## 2012-04-05 NOTE — Telephone Encounter (Signed)
Pt's son called to express concerns about [facility].  They have come out to teach pt's daughter to administer daily IV antibiotics via the PICC line.  She was instructed to give the antibiotics at the same time each day, but she works full time and cannot leave her job to administer it at 2:30 pm as the home health nurse insisted it had to be given.  Also they laid such a heavy emphasis on eliminating all air from the IV line that she is afraid to do this at all.  Brother states she is overwhelmed and very fearful.  Brother asks if home health can please be ordered to come out daily to administer the remainder of the antibiotics.  Please advise.  Brother's number is [phone] or ([name]) sister's number is [phone].

## 2012-04-05 NOTE — Telephone Encounter (Signed)
Kenneth Little working on switching home health agencies so that the patient can have a nurse out daily.

## 2012-04-05 NOTE — Telephone Encounter (Signed)
Patient called to ask if there was a way we could get home health to come out to do his antibiotic infusions each day.  Patient states daughter has been doing it however she is going to loss her job if she continues to have to take off work to come prove this care.  Darl Pikes is currently working on trying to find an agency who will come out daily.

## 2012-04-06 ENCOUNTER — Telehealth (INDEPENDENT_AMBULATORY_CARE_PROVIDER_SITE_OTHER): Payer: Self-pay | Admitting: General Surgery

## 2012-04-06 LAB — CULTURE, BLOOD (ROUTINE X 2): Culture: NO GROWTH

## 2012-04-06 NOTE — Telephone Encounter (Signed)
Patient's son called for clarification on situation with home health care. I explained that this was worked on by Darl Pikes in our office yesterday with [facility] and they stated insurance would not cover a nurse to come to their home daily. From my understanding, Pam from [facility] was to come to the home yesterday and re-explain how to give meds to patient. He was confused because he got a call from the hospital to check on them and they told him if Dr Andrey Campanile wrote the orders for daily nurse visits that they had to send a nurse daily. I explained it wasn't dictated by Dr Andrey Campanile, but by the insurance company what they would cover. He expressed understanding and will call with any other questions.

## 2012-04-09 ENCOUNTER — Telehealth (INDEPENDENT_AMBULATORY_CARE_PROVIDER_SITE_OTHER): Payer: Self-pay

## 2012-04-09 NOTE — Telephone Encounter (Signed)
Ramond Dial with [facility] called requesting order to pull pick line once antibiotics are finished on 04-11-12. She also wants to know if any labs need to be drawn prior to line pull. I advised her I will send request to Dr Andrey Campanile and his assistant. She can be reached at [phone].

## 2012-04-10 NOTE — Telephone Encounter (Signed)
Called and spoke with Joyce Gross at [facility]. She will go out and draw CBC with diff and a CMET on patient today. If these lab results are okay I made her aware she can pull the PICC line on 04/11/2012. She will let us know and call with any questions.

## 2012-04-10 NOTE — Telephone Encounter (Signed)
Patient being seen Thursday. Please advise if you want to give orders or wait until you see him on Thursday.

## 2012-04-10 NOTE — Telephone Encounter (Signed)
Have them draw cbc with diffl and CMET prior to pulling PICC. They can pull PICC on 4/2

## 2012-04-11 ENCOUNTER — Telehealth (INDEPENDENT_AMBULATORY_CARE_PROVIDER_SITE_OTHER): Payer: Self-pay

## 2012-04-11 NOTE — Telephone Encounter (Signed)
Kay @ [facility] called to report patient CBC and CMP results are all normal.  They are schedule to remove patient PICC line today @ 1:00 pm.

## 2012-04-11 NOTE — Telephone Encounter (Signed)
The pt's daughter called in today asking about the pt's appt with Dr Andrey Campanile for tomorrow whether the pt needed to keep the appt b/c they are not sure if they want the pt to get his gallbladder removed on 04/16/12. The daughter is requesting a 2nd opinion from one of these doctors Dr Michaelene Song Dwain Sarna, or Dr Magnus Ivan. I did an all doctor search to see who had an opening first which was Dr Abbey Chatters. I made the appt for 04/30/12 with Dr Abbey Chatters at 4:00pm. The daughter was asking about canceling the surgery for 04/16/12 with Dr Andrey Campanile but I told her the pt really needs to keep his appt with Dr Andrey Campanile so they can discuss canceling the surgery. The daughter will make sure the pt comes to the appt with Dr Andrey Campanile in the a.m.

## 2012-04-12 ENCOUNTER — Ambulatory Visit (INDEPENDENT_AMBULATORY_CARE_PROVIDER_SITE_OTHER): Payer: 59 | Admitting: General Surgery

## 2012-04-12 ENCOUNTER — Encounter (INDEPENDENT_AMBULATORY_CARE_PROVIDER_SITE_OTHER): Payer: Self-pay | Admitting: General Surgery

## 2012-04-12 ENCOUNTER — Encounter (HOSPITAL_COMMUNITY): Admission: RE | Admit: 2012-04-12 | Payer: 59 | Source: Ambulatory Visit

## 2012-04-12 VITALS — BP 114/66 | HR 68 | Temp 97.0°F | Resp 12 | Ht 67.0 in | Wt 173.6 lb

## 2012-04-12 DIAGNOSIS — Z09 Encounter for follow-up examination after completed treatment for conditions other than malignant neoplasm: Secondary | ICD-10-CM

## 2012-04-12 NOTE — Progress Notes (Signed)
Subjective:     Patient ID: Kenneth Little, male   DOB: September 23, 1945, 67 y.o.   MRN: [id number]  HPI 67 year old Asian American gentleman comes in today for several reasons. He underwent open excisional hemorrhoidectomy x2 and hemorrhoidal banding on March 7 However he developed high fever and chills several days after surgery. Please see below.   67 y.o. male with past medical history significant for hypertension, diabetes, dyslipidemia and recent hemorrhoidectomy who presented to [facility] ED 03/28/2012 for persistent fever and chills. Patient was in ED 4 days prior to this admission due to fever and workup included CT chest angiogram which was negative and patient was subsequently discharged home. There was no signs of pelvic sepsis.  I contacted the patient on March 18 and he was doing well. He stated that he felt much better. However on Wednesday, March 19 he redeveloped high fever and chills and extreme fatigue. He was taken to the emergency room and admitted from March 19 through March 22.   In ED, patient was tachycardic and hypotensive along with the temperature of 102.9 F. His work up further revealed elevated liver enzymes. Patient had HIDA scan which revealed gallbladder wall thickening but no obvious cholelithiasis (Abdominal US showed the same results). CT abdomen showed gallbladder wall thickening And gallstone in gallbladder neck with pericholecystic edema, findings nonspecific but may be suggestive of early cholecystitis. There was no sign of pelvic or perirectal abscess. Blood cultures are growing E.Coli.   He received a total of 2 weeks of IV antibiotics. His PICC line was removed yesterday. He had lab work done yesterday which showed a normal CBC as well as a normal comprehensive metabolic panel with the exception of a blood sugar level of 181. The patient was not fasting  He denies any fever, chills, abdominal pain, nausea or vomiting. He states that he still feels a little bit weak and rundown. He  denies any diarrhea or constipation. He denies any dysuria, melena or hematochezia. He denies any pain urinating he denies any pain defecate  Review of Systems As above    Objective:   Physical Exam BP 114/66  Pulse 68  Temp(Src) 97 F (36.1 C) (Temporal)  Resp 12  Ht 5\' 7"  (1.702 m)  Wt 173 lb 9.6 oz (78.744 kg)  BMI 27.18 kg/m2  Gen: alert, NAD, non-toxic appearing Pupils: equal, no scleral icterus Pulm: Lungs clear to auscultation, symmetric chest rise CV: regular rate and rhythm Abd: soft, nontender, nondistended.  Ext: no edema, no calf tenderness Skin: no rash, no jaundice Rectal: Digital rectal exam was deferred.Left lateral incision is healing well but still somewhat open. No cellulitis, induration or fluctuance     Assessment:     Status post excisional hemorrhoidectomy x2 and hemorrhoidal banding  Escherichia coli bacteremia Elevated LFTs Gallbladder wall thickening with edema with gallstone in the gallbladder neck    Plan:     First, he appears to be healing well from his hemorrhoidectomy. I explained that it'll take another few weeks for the area to completely heal. I explained the importance of having daily bowel movements, drinking plenty of liquids, and eating a high fiber diet  With respect to his recent hospitalization for Escherichia coli bacteremia, elevated LFTs as well as several studies showing a thickened gallbladder wall with pericholecystic edema and a gallstone in the gallbladder neck-we had a long conversation regarding this. He was accompanied by his daughter who had a lot of questions. I explained that that his presentation was  atypical. He only had really one episode of abdominal pain other than that his primary symptoms were fever, chills and low energy level. I explained that I do think he needs to have a cholecystectomy because of the risk of recurrent cholecystitis given the fact that he has a gallstone in the gallbladder neck. I explained that I  do not think that the Escherichia coli bacteremia came from his perirectal area. There were no signs of pelvic sepsis on any occasion. His workup points to the gallbladder as a source of his Escherichia coli bacteremia. The daughter explained that initially she was told different things by multiple physicians and she was a little bit confused as to what the source of his illness was from.  The patient and the daughter are concerned that he has not returned to his full functional self. Normally he is very active gentleman with lots of energy. I explained that he probably still has some low energy due to his recent bacteremia. He and his daughter are interested in delaying surgery until he is more fully recovered. They're also interested in speaking with another surgeon from our group about a second opinion. He is currently scheduled to see Dr. Abbey Chatters around April 21. I explained to him and his daughter that I am completely okay with this. We have canceled his gallbladder surgery that was originally scheduled for April 7. I explained that there is a possibility he could develop a recurrent infection of his gallbladder. We had a prolonged discussion about the signs and symptoms and what he should do should these occur. Since he is afebrile, asymptomatic, with normal labs I am reluctant to continue him on empiric antibiotics. He did receive 2 weeks of IV antibiotics. I did inform him and his daughter that I am out of the country toward the end of the month for 10 days.  He is to follow with his primary physician in the next few days. He is to see Dr. Abbey Chatters toward the end of the month. Final surgical plans will be based on their discussion.  Mary Sella. Andrey Campanile, MD, FACS General, Bariatric, & Minimally Invasive Surgery [facility], [place]

## 2012-04-12 NOTE — Patient Instructions (Signed)
Start taking small walks daily Eat healthy Call us immediately if you develop fever, chills, abdominal pain, nausea, vomiting

## 2012-04-16 ENCOUNTER — Encounter: Payer: Self-pay | Admitting: Internal Medicine

## 2012-04-16 ENCOUNTER — Encounter (HOSPITAL_COMMUNITY): Admission: RE | Payer: Self-pay | Source: Ambulatory Visit

## 2012-04-16 ENCOUNTER — Ambulatory Visit (INDEPENDENT_AMBULATORY_CARE_PROVIDER_SITE_OTHER): Payer: 59 | Admitting: Internal Medicine

## 2012-04-16 ENCOUNTER — Ambulatory Visit (HOSPITAL_COMMUNITY): Admission: RE | Admit: 2012-04-16 | Payer: 59 | Source: Ambulatory Visit | Admitting: General Surgery

## 2012-04-16 VITALS — BP 110/64 | HR 72 | Temp 97.5°F | Resp 18 | Wt 177.0 lb

## 2012-04-16 DIAGNOSIS — E119 Type 2 diabetes mellitus without complications: Secondary | ICD-10-CM

## 2012-04-16 DIAGNOSIS — I10 Essential (primary) hypertension: Secondary | ICD-10-CM

## 2012-04-16 DIAGNOSIS — A419 Sepsis, unspecified organism: Secondary | ICD-10-CM

## 2012-04-16 SURGERY — LAPAROSCOPIC CHOLECYSTECTOMY WITH INTRAOPERATIVE CHOLANGIOGRAM
Anesthesia: General

## 2012-04-16 NOTE — Patient Instructions (Signed)
Slowly resume your usual activities   Please check your hemoglobin A1c every 3 months

## 2012-04-16 NOTE — Progress Notes (Signed)
Subjective:    Patient ID: Kenneth Little, male    DOB: 1945-11-14, 67 y.o.   MRN: [id number]  HPI  67 year old patient who is seen today following a hospital discharge. He was admitted for Escherichia coli sepsis.  He has completed outpatient antibiotic therapy and last week his PICC line and antibiotic therapy discontinued.  He states that he had followup lab including a normal WBC count and normalization of elevated liver function studies. There was some concern about gallbladder disease as a cause of his Escherichia coli sepsis. He did have a hemorrhoidectomy several days prior to onset of sepsis. Today he feels quite well. Hospital evaluation included a CT abdominal scan that was worrisome for a small stone in the cystic duct. However there is no ductal dilatation and subsequent gallbladder ultrasound and  gallbladder nuclear medicine testing was normal.  He is slightly weak but has had no recurrent fever. He is anxious to get back to his usual activities that includes basketball.  Past Medical History  Diagnosis Date  . Hypertension   . Hyperlipidemia   . UTI (lower urinary tract infection)   . Diabetes mellitus without complication     History   Social History  . Marital Status: Married    Spouse Name: N/A    Number of Children: N/A  . Years of Education: N/A   Occupational History  . Not on file.   Social History Main Topics  . Smoking status: Former Smoker    Types: Cigarettes    Quit date: 11/20/1988  . Smokeless tobacco: Never Used  . Alcohol Use: No  . Drug Use: No  . Sexually Active: Yes   Other Topics Concern  . Not on file   Social History Narrative  . No narrative on file    Past Surgical History  Procedure Laterality Date  . Hemorrhoid surgery  03/20/12    internal  . Diverticulitis      Family History  Problem Relation Age of Onset  . Cancer Mother     breast  . Hypertension Father   . Diabetes Brother     No Known Allergies  Current Outpatient  Prescriptions on File Prior to Visit  Medication Sig Dispense Refill  . aspirin EC 81 MG tablet Take 81 mg by mouth daily.      Marland Kitchen atorvastatin (LIPITOR) 40 MG tablet Take 40 mg by mouth daily.      . ergocalciferol (VITAMIN D2) 50000 UNITS capsule Take 50,000 Units by mouth once a week. Thursday      . indapamide (LOZOL) 2.5 MG tablet Take 2.5 mg by mouth every morning.      . insulin glargine (LANTUS) 100 UNIT/ML injection Inject 25 Units into the skin at bedtime.       . INVOKANA 300 MG TABS Take 1 tablet by mouth daily.       . metFORMIN (GLUCOPHAGE) 1000 MG tablet Take 1,000 mg by mouth 2 (two) times daily with a meal.      . pioglitazone (ACTOS) 45 MG tablet Take 45 mg by mouth daily.       . sitaGLIPtin (JANUVIA) 100 MG tablet Take 100 mg by mouth daily.      . valsartan (DIOVAN) 320 MG tablet Take 320 mg by mouth daily.       No current facility-administered medications on file prior to visit.    BP 110/64  Pulse 72  Temp(Src) 97.5 F (36.4 C) (Oral)  Resp 18  Wt 177  lb (80.287 kg)  BMI 27.72 kg/m2  SpO2 97%       Review of Systems  Constitutional: Positive for fatigue. Negative for fever, chills and appetite change.  HENT: Negative for hearing loss, ear pain, congestion, sore throat, trouble swallowing, neck stiffness, dental problem, voice change and tinnitus.   Eyes: Negative for pain, discharge and visual disturbance.  Respiratory: Negative for cough, chest tightness, wheezing and stridor.   Cardiovascular: Negative for chest pain, palpitations and leg swelling.  Gastrointestinal: Negative for nausea, vomiting, abdominal pain, diarrhea, constipation, blood in stool and abdominal distention.  Genitourinary: Negative for urgency, hematuria, flank pain, discharge, difficulty urinating and genital sores.  Musculoskeletal: Negative for myalgias, back pain, joint swelling, arthralgias and gait problem.  Skin: Negative for rash.  Neurological: Positive for weakness.  Negative for dizziness, syncope, speech difficulty, numbness and headaches.  Hematological: Negative for adenopathy. Does not bruise/bleed easily.  Psychiatric/Behavioral: Negative for behavioral problems and dysphoric mood. The patient is not nervous/anxious.        Objective:   Physical Exam  Constitutional: He is oriented to person, place, and time. He appears well-developed and well-nourished. No distress.  HENT:  Head: Normocephalic.  Right Ear: External ear normal.  Left Ear: External ear normal.  Eyes: Conjunctivae and EOM are normal.  Neck: Normal range of motion.  Cardiovascular: Normal rate and normal heart sounds.   Pulmonary/Chest: Breath sounds normal.  Abdominal: Soft. Bowel sounds are normal. He exhibits no distension. There is no tenderness. There is no rebound and no guarding.  Musculoskeletal: Normal range of motion. He exhibits no edema and no tenderness.  Neurological: He is alert and oriented to person, place, and time.  Psychiatric: He has a normal mood and affect. His behavior is normal.          Assessment & Plan:   History of Escherichia coli sepsis. Clinically resolved History of elevated liver functions studies. Probably secondary to sepsis syndrome. Doubt gallbladder etiology. The patient does have a general surgical followup appointment Diabetes mellitus. The patient is followed by endocrinology every 3 months  Return here when necessary

## 2012-04-25 ENCOUNTER — Encounter (INDEPENDENT_AMBULATORY_CARE_PROVIDER_SITE_OTHER): Payer: Self-pay

## 2012-04-30 ENCOUNTER — Encounter (INDEPENDENT_AMBULATORY_CARE_PROVIDER_SITE_OTHER): Payer: Self-pay | Admitting: General Surgery

## 2012-04-30 ENCOUNTER — Ambulatory Visit (INDEPENDENT_AMBULATORY_CARE_PROVIDER_SITE_OTHER): Payer: 59 | Admitting: General Surgery

## 2012-04-30 VITALS — BP 110/58 | HR 91 | Temp 97.0°F | Ht 67.5 in | Wt 175.6 lb

## 2012-04-30 DIAGNOSIS — K8020 Calculus of gallbladder without cholecystitis without obstruction: Secondary | ICD-10-CM | POA: Insufficient documentation

## 2012-04-30 NOTE — Progress Notes (Signed)
Subjective:     Patient ID: Kenneth Little, male   DOB: 1945/01/31, 67 y.o.   MRN: [id number]  HPI  He comes to see me today regarding a second opinion for cholecystectomy. I have reviewed  his records.  There is discordance between his clinical course and some of his radiographic findings.  During his illness, ultrasound and CT scan were suggestive of acute cholecystitis. However a HIDA was not consistent with this. He did not have right upper quadrant pain or tenderness. However, he was still having pain from his hemorrhoidectomy so this could have masked any abdominal pain.  He currently is not having any abdominal pain. He is regaining his strength. He and his daughter question whether he asked he needs a cholecystectomy or not.   Review of SystemsNo fever, chills, or food intolerance, abdominal pain.     Objective:   Physical Exam Gen.-he looks well and is in no acute distress. No scleral icterus is present.  Abdomen-soft, nontender, no masses    Assessment:     Escherichia coli sepsis. Radiographic studies are discordant growth respect to what he had acute cholecystitis or not.     Plan:     I told him this could be managed expectantly or he could have cholecystectomy. We went over the procedure and the risks of the cholecystectomy. At this time, he would just like to manage i expectantly. I gave him some literature on gallbladder disease and informed him that if he started having problems with the gallbladder to call and let us know as we would recommend an operation at that time.

## 2012-04-30 NOTE — Patient Instructions (Signed)
Call if you start having problems with your gallbladder.

## 2012-05-16 ENCOUNTER — Encounter (INDEPENDENT_AMBULATORY_CARE_PROVIDER_SITE_OTHER): Payer: 59 | Admitting: General Surgery

## 2012-07-16 ENCOUNTER — Encounter: Payer: Self-pay | Admitting: Internal Medicine

## 2012-07-16 ENCOUNTER — Ambulatory Visit (INDEPENDENT_AMBULATORY_CARE_PROVIDER_SITE_OTHER): Payer: 59 | Admitting: Internal Medicine

## 2012-07-16 VITALS — BP 100/60 | HR 81 | Temp 98.5°F | Resp 18 | Wt 183.0 lb

## 2012-07-16 DIAGNOSIS — I10 Essential (primary) hypertension: Secondary | ICD-10-CM

## 2012-07-16 DIAGNOSIS — E119 Type 2 diabetes mellitus without complications: Secondary | ICD-10-CM

## 2012-07-16 DIAGNOSIS — E7800 Pure hypercholesterolemia, unspecified: Secondary | ICD-10-CM

## 2012-07-16 NOTE — Progress Notes (Signed)
Subjective:    Patient ID: Kenneth Little, male    DOB: [birth year], 67 y.o.   MRN: [id number]  HPI  67 year old patient who is seen today for followup. He is followed quarterly by endocrinology for type 2 diabetes. He was hospitalized recently for Escherichia coli sepsis following a hemorrhoidectomy.  He has a history of gallstones and has been evaluated by general surgery recently.  The patient wishes to avoid surgery as long as he remains symptom-free. His tongue quite well and has resumed usual [facility] [employer]  Past Medical History  Diagnosis Date  . Hypertension   . Hyperlipidemia   . UTI (lower urinary tract infection)   . Diabetes mellitus without complication     History   Social History  . Marital Status: Married    Spouse Name: N/A    Number of Children: N/A  . Years of Education: N/A   Occupational History  . Not on file.   Social History Main Topics  . Smoking status: Former Smoker    Types: Cigarettes    Quit date: 11/20/1988  . Smokeless tobacco: Never Used  . Alcohol Use: No  . Drug Use: No  . Sexually Active: Yes   Other Topics Concern  . Not on file   Social History Narrative  . No narrative on file    Past Surgical History  Procedure Laterality Date  . Hemorrhoid surgery  03/20/12    internal  . Diverticulitis      Family History  Problem Relation Age of Onset  . Cancer Mother     breast  . Hypertension Father   . Diabetes Brother     No Known Allergies  Current Outpatient Prescriptions on File Prior to Visit  Medication Sig Dispense Refill  . aspirin EC 81 MG tablet Take 81 mg by mouth daily.      Marland Kitchen atorvastatin (LIPITOR) 40 MG tablet Take 40 mg by mouth daily.      . Calcium Citrate-Vitamin D (CALCIUM CITRATE + D3 PO) Take 1 capsule by mouth daily.      . ergocalciferol (VITAMIN D2) 50000 UNITS capsule Take 50,000 Units by mouth once a week. Thursday      . indapamide (LOZOL) 2.5 MG tablet Take 2.5 mg by mouth  every morning.      . insulin glargine (LANTUS) 100 UNIT/ML injection Inject 25 Units into the skin at bedtime.       . INVOKANA 300 MG TABS Take 1 tablet by mouth daily.       . metFORMIN (GLUCOPHAGE) 1000 MG tablet Take 1,000 mg by mouth 2 (two) times daily with a meal.      . ONE TOUCH ULTRA TEST test strip       . pioglitazone (ACTOS) 45 MG tablet Take 45 mg by mouth daily.       . sitaGLIPtin (JANUVIA) 100 MG tablet Take 100 mg by mouth daily.      . valsartan (DIOVAN) 320 MG tablet Take 320 mg by mouth daily.       No current facility-administered medications on file prior to visit.    BP 100/60  Pulse 81  Temp(Src) 98.5 F (36.9 C) (Oral)  Resp 18  Wt 183 lb (83.008 kg)  BMI 28.22 kg/m2  SpO2 94%       Review of Systems  Constitutional: Negative for fever, chills, appetite change and fatigue.  HENT: Negative for hearing loss, ear pain, congestion, sore throat, trouble swallowing,  neck stiffness, dental problem, voice change and tinnitus.   Eyes: Negative for pain, discharge and visual disturbance.  Respiratory: Negative for cough, chest tightness, wheezing and stridor.   Cardiovascular: Negative for chest pain, palpitations and leg swelling.  Gastrointestinal: Negative for nausea, vomiting, abdominal pain, diarrhea, constipation, blood in stool and abdominal distention.  Genitourinary: Negative for urgency, hematuria, flank pain, discharge, difficulty urinating and genital sores.  Musculoskeletal: Negative for myalgias, back pain, joint swelling, arthralgias and gait problem.  Skin: Negative for rash.  Neurological: Negative for dizziness, syncope, speech difficulty, weakness, numbness and headaches.  Hematological: Negative for adenopathy. Does not bruise/bleed easily.  Psychiatric/Behavioral: Negative for behavioral problems and dysphoric mood. The patient is not nervous/anxious.        Objective:   Physical Exam  Constitutional: He is oriented to person, place,  and time. He appears well-developed.  HENT:  Head: Normocephalic.  Right Ear: External ear normal.  Left Ear: External ear normal.  Eyes: Conjunctivae and EOM are normal.  Neck: Normal range of motion.  Cardiovascular: Normal rate and normal heart sounds.   Pulmonary/Chest: Breath sounds normal.  Abdominal: Bowel sounds are normal.  Musculoskeletal: Normal range of motion. He exhibits no edema and no tenderness.  Neurological: He is alert and oriented to person, place, and time.  Psychiatric: He has a normal mood and affect. His behavior is normal.          Assessment & Plan:  Diabetes mellitus. Followup endocrinology Hypertension well controlled. Recheck 6 months Asymptomatic cholelithiasis. Will observe Health maintenance. Patient is asked to obtain prior medical records. He is unclear the dates of his prior colonoscopy and tetanus vaccine. He is unclear whether he has had a shingles vaccine.

## 2012-07-16 NOTE — Patient Instructions (Addendum)
Please have your medical records transferred from [facility] medical group  Followup endocrinology  Recheck here 6 months or as needed

## 2012-11-15 ENCOUNTER — Other Ambulatory Visit: Payer: Self-pay

## 2012-12-19 ENCOUNTER — Emergency Department (HOSPITAL_COMMUNITY)
Admission: EM | Admit: 2012-12-19 | Discharge: 2012-12-20 | Disposition: A | Discharge status: Home / Self Care | Payer: Medicare Other | Source: Home / Self Care | Attending: Emergency Medicine | Admitting: Emergency Medicine

## 2012-12-19 ENCOUNTER — Encounter (HOSPITAL_COMMUNITY): Payer: Self-pay | Admitting: Emergency Medicine

## 2012-12-19 DIAGNOSIS — R1013 Epigastric pain: Secondary | ICD-10-CM | POA: Diagnosis not present

## 2012-12-19 DIAGNOSIS — Z79899 Other long term (current) drug therapy: Secondary | ICD-10-CM | POA: Insufficient documentation

## 2012-12-19 DIAGNOSIS — E785 Hyperlipidemia, unspecified: Secondary | ICD-10-CM | POA: Insufficient documentation

## 2012-12-19 DIAGNOSIS — R141 Gas pain: Secondary | ICD-10-CM | POA: Insufficient documentation

## 2012-12-19 DIAGNOSIS — A4151 Sepsis due to Escherichia coli [E. coli]: Secondary | ICD-10-CM | POA: Diagnosis not present

## 2012-12-19 DIAGNOSIS — Z794 Long term (current) use of insulin: Secondary | ICD-10-CM | POA: Insufficient documentation

## 2012-12-19 DIAGNOSIS — Z7982 Long term (current) use of aspirin: Secondary | ICD-10-CM | POA: Insufficient documentation

## 2012-12-19 DIAGNOSIS — R142 Eructation: Secondary | ICD-10-CM | POA: Insufficient documentation

## 2012-12-19 DIAGNOSIS — R110 Nausea: Secondary | ICD-10-CM

## 2012-12-19 DIAGNOSIS — I10 Essential (primary) hypertension: Secondary | ICD-10-CM | POA: Insufficient documentation

## 2012-12-19 DIAGNOSIS — R7401 Elevation of levels of liver transaminase levels: Secondary | ICD-10-CM | POA: Insufficient documentation

## 2012-12-19 DIAGNOSIS — R112 Nausea with vomiting, unspecified: Secondary | ICD-10-CM | POA: Insufficient documentation

## 2012-12-19 DIAGNOSIS — Z87440 Personal history of urinary (tract) infections: Secondary | ICD-10-CM | POA: Insufficient documentation

## 2012-12-19 DIAGNOSIS — R7402 Elevation of levels of lactic acid dehydrogenase (LDH): Secondary | ICD-10-CM | POA: Insufficient documentation

## 2012-12-19 DIAGNOSIS — E119 Type 2 diabetes mellitus without complications: Secondary | ICD-10-CM | POA: Insufficient documentation

## 2012-12-19 DIAGNOSIS — Z87891 Personal history of nicotine dependence: Secondary | ICD-10-CM | POA: Insufficient documentation

## 2012-12-19 NOTE — ED Notes (Signed)
Pt complains of epigastric pain before dinner this evening, pain became a little worse and constant after dinner, pt is belching

## 2012-12-19 NOTE — ED Provider Notes (Signed)
CSN: [id number]     Arrival date & time 12/19/12  2313 History   First MD Initiated Contact with Patient 12/19/12 2332     Chief Complaint  Patient presents with  . Abdominal Pain   (Consider location/radiation/quality/duration/timing/severity/associated sxs/prior Treatment) HPI 67 yo male presents with gradual onset epigastric pain following dinner. Patient reports eating dinner around 730pm this evening and pain began around 10pm. Pain rated 3/10 currently and described as a "heartburny" sensation localized to the epigastrum. Admits to nausea and chills. Denies fever, vomiting, diarrhea, difficulty urinating. Denies hematuria, hematochezia, and melena. Patient admits to hx of gallstones and a hx of abdominal surgery. Last bowel movement was today and reported to be normal. Patient is former smoker and denies alcohol use.   Past Medical History  Diagnosis Date  . Hypertension   . Hyperlipidemia   . UTI (lower urinary tract infection)   . Diabetes mellitus without complication    Past Surgical History  Procedure Laterality Date  . Hemorrhoid surgery  03/20/12    internal  . Diverticulitis     Family History  Problem Relation Age of Onset  . Cancer Mother     breast  . Hypertension Father   . Diabetes Brother    History  Substance Use Topics  . Smoking status: Former Smoker    Types: Cigarettes    Quit date: 11/20/1988  . Smokeless tobacco: Never Used  . Alcohol Use: No    Review of Systems  Respiratory: Negative for shortness of breath.   Cardiovascular: Negative for chest pain, palpitations and leg swelling.  Gastrointestinal: Positive for abdominal pain and abdominal distention. Negative for diarrhea and constipation.  Genitourinary: Negative for dysuria, flank pain, scrotal swelling and testicular pain.  Musculoskeletal: Negative for back pain.    Allergies  Review of patient's allergies indicates no known allergies.  Home Medications   Current Outpatient Rx   Name  Route  Sig  Dispense  Refill  . aspirin EC 81 MG tablet   Oral   Take 81 mg by mouth daily.         Marland Kitchen atorvastatin (LIPITOR) 40 MG tablet   Oral   Take 40 mg by mouth daily.         . Calcium Citrate-Vitamin D (CALCIUM CITRATE + D3 PO)   Oral   Take 1 capsule by mouth daily.         . ergocalciferol (VITAMIN D2) 50000 UNITS capsule   Oral   Take 50,000 Units by mouth once a week. Thursday         . indapamide (LOZOL) 2.5 MG tablet   Oral   Take 2.5 mg by mouth every morning.         . insulin glargine (LANTUS) 100 UNIT/ML injection   Subcutaneous   Inject 25 Units into the skin at bedtime.          . INVOKANA 300 MG TABS   Oral   Take 1 tablet by mouth daily.          . metFORMIN (GLUCOPHAGE) 1000 MG tablet   Oral   Take 1,000 mg by mouth 2 (two) times daily with a meal.         . pioglitazone (ACTOS) 45 MG tablet   Oral   Take 45 mg by mouth daily.          . sitaGLIPtin (JANUVIA) 100 MG tablet   Oral   Take 100 mg by mouth daily.         Marland Kitchen  valsartan (DIOVAN) 320 MG tablet   Oral   Take 320 mg by mouth daily.         Marland Kitchen HYDROcodone-acetaminophen (NORCO/VICODIN) 5-325 MG per tablet   Oral   Take 1 tablet by mouth every 4 (four) hours as needed.   15 tablet   0   . ondansetron (ZOFRAN) 4 MG tablet   Oral   Take 1 tablet (4 mg total) by mouth every 8 (eight) hours as needed for nausea or vomiting.   10 tablet   0    BP 110/52  Pulse 101  Temp(Src) 97.5 F (36.4 C) (Oral)  Resp 19  Ht 5\' 8"  (1.727 m)  Wt 165 lb (74.844 kg)  BMI 25.09 kg/m2  SpO2 92% Physical Exam  Nursing note and vitals reviewed. Constitutional: He is oriented to person, place, and time. He appears well-developed and well-nourished. No distress.  HENT:  Head: Normocephalic and atraumatic.  Eyes: Conjunctivae are normal. No scleral icterus.  Cardiovascular: Normal rate and regular rhythm.  Exam reveals no gallop and no friction rub.   No murmur  heard. Pulmonary/Chest: Effort normal and breath sounds normal. No respiratory distress. He has no wheezes. He has no rales.  Abdominal: Soft. Bowel sounds are normal. He exhibits distension (mild). He exhibits no mass. There is tenderness (epigastric (verymild)). There is no rebound and no guarding. Hernia confirmed negative in the right inguinal area and confirmed negative in the left inguinal area.  Genitourinary: Right testis shows no swelling. Left testis shows no swelling.  Musculoskeletal: Normal range of motion. He exhibits no edema.  Neurological: He is alert and oriented to person, place, and time.  Skin: Skin is warm and dry. He is not diaphoretic.  Psychiatric: He has a normal mood and affect. His behavior is normal.    ED Course  Procedures (including critical care time) Labs Review Labs Reviewed  COMPREHENSIVE METABOLIC PANEL - Abnormal; Notable for the following:    Potassium 3.2 (*)    Chloride 94 (*)    Glucose, Bld 175 (*)    BUN 25 (*)    Albumin 3.4 (*)    AST 678 (*)    ALT 379 (*)    Alkaline Phosphatase 286 (*)    Total Bilirubin 2.5 (*)    GFR calc non Af Amer 88 (*)    All other components within normal limits  LIPASE, BLOOD - Abnormal; Notable for the following:    Lipase 64 (*)    All other components within normal limits  CBC WITH DIFFERENTIAL - Abnormal; Notable for the following:    WBC 3.6 (*)    Platelets 138 (*)    Neutrophils Relative % 85 (*)    Lymphs Abs 0.4 (*)    Monocytes Relative 2 (*)    All other components within normal limits  URINALYSIS, ROUTINE W REFLEX MICROSCOPIC - Abnormal; Notable for the following:    Specific Gravity, Urine 1.035 (*)    Glucose, UA >1000 (*)    All other components within normal limits  TROPONIN I  URINE MICROSCOPIC-ADD ON   Imaging Review US Abdomen Complete  12/20/2012   CLINICAL DATA:  Epigastric pain after eating. History of diabetes and hypertension.  EXAM: ULTRASOUND ABDOMEN COMPLETE  COMPARISON:   CT of the abdomen and pelvis on 03/1912  FINDINGS: Gallbladder:  The gallbladder wall is thickened, 4.6 mm. Small stones are identified within the gallbladder, measuring on the order of 6 mm. No sonographic Murphy sign. No  pericholecystic fluid.  Common bile duct:  Diameter: Common bile duct is 4.0 mm, normal.  Liver:  No focal lesion identified. Within normal limits in parenchymal echogenicity.  IVC:  No abnormality visualized.  Pancreas:  Visualized portion unremarkable.  Spleen:  7.3 cm, normal in appearance.  Right Kidney:  Length: 11.5 cm. Echogenicity within normal limits. No mass or hydronephrosis visualized.  Left Kidney:  Length: 10.6 cm. Echogenicity within normal limits. No mass or hydronephrosis visualized.  Abdominal aorta:  Visualized portion not aneurysmal, 1.6 cm maximum diameter.  Other findings:  None  IMPRESSION: 1. Nonspecific gallbladder wall thickening and gallstones without other evidence for acute cholecystitis. 2. No hydronephrosis.   Electronically Signed   By: Rosalie Gums M.D.   On: 12/20/2012 01:17   Ct Abdomen Pelvis W Contrast  12/20/2012   CLINICAL DATA:  Epigastric abdominal pain. Elevated LFTs. Elevated lipase.  EXAM: CT ABDOMEN AND PELVIS WITH CONTRAST  TECHNIQUE: Multidetector CT imaging of the abdomen and pelvis was performed using the standard protocol following bolus administration of intravenous contrast.  CONTRAST:  OMNIPAQUE IOHEXOL 300 MG/ML  SOLN  COMPARISON:  Abdominal ultrasound performed earlier today at 12:36 a.m., and CT of the abdomen and pelvis performed 03/28/2012  FINDINGS: Mild bibasilar airspace opacities may reflect atelectasis or possibly mild pneumonia.  A 4 mm hypodensity at the hepatic dome is nonspecific but may reflect a tiny cyst. The spleen is unremarkable in appearance. There is mild gallbladder wall thickening, without definite pericholecystic fluid. This is nonspecific in appearance. A few small stones are seen at the base of the  gallbladder, without evidence of distal obstruction. The pancreas and adrenal glands are unremarkable in appearance.  The kidneys are unremarkable in appearance. There is no evidence of hydronephrosis. No renal or ureteral stones are seen. Mild nonspecific perinephric stranding is noted bilaterally.  No free fluid is identified. The small bowel is unremarkable in appearance. The stomach is within normal limits. No acute vascular abnormalities are seen. Relatively diffuse calcification is noted along the abdominal aorta and its branches.  The appendix is not definitely seen; there is no evidence for appendicitis. Scattered diverticulosis is noted along the ascending colon, without evidence of diverticulitis. The colon is otherwise unremarkable in appearance.  The bladder is mildly distended and grossly unremarkable in appearance. The prostate is markedly enlarged, measuring 6.3 cm in transverse dimension, with somewhat heterogeneous enhancement. A small left inguinal hernia is seen, containing only fat. No inguinal lymphadenopathy is seen.  No acute osseous abnormalities are identified.  IMPRESSION: 1. Mild gallbladder wall thickening, without definite pericholecystic fluid. This is nonspecific, but could reflect mild cholecystitis. Few stones noted at the base of the gallbladder, without evidence of distal obstruction. 2. Mild bibasilar airspace opacities may reflect atelectasis or possibly mild pneumonia. 3. Relatively diffuse calcification along the abdominal aorta and its branches. 4. Scattered diverticulosis along the ascending colon, without evidence of diverticulitis. 5. Markedly enlarged prostate again noted, with heterogeneous enhancement. This has increased in size from the prior study; would evaluate further to exclude underlying malignancy. 6. Question of tiny hepatic cyst. 7. Small left inguinal hernia, containing only fat.   Electronically Signed   By: Roanna Raider M.D.   On: 12/20/2012 04:39     EKG Interpretation    Date/Time:  Wednesday December 19 2012 23:32:43 EST Ventricular Rate:  85 PR Interval:  199 QRS Duration: 97 QT Interval:  362 QTC Calculation: 430 R Axis:   -58 Text Interpretation:  Sinus  rhythm Probable left atrial enlargement Left anterior fascicular block Anterior infarct, old since last tracing no significant change Confirmed by MILLER  MD, BRIAN [phone]) on 12/19/2012 11:55:21 PM            MDM   1. Epigastric pain   2. Nausea   3. Elevated transaminase level    EKG unchanged since previous. Troponin negative.   Abdominal US shows Nonspecific gallbladder wall thickening and gallstones without evidence for acute cholecystitis. Negative Murphy sign. No signs of Hydronephrosis present.    Labs show mild hypokalemia. BUN elevated. Transaminases markedly elevated, with elevation of alk phos and total bili. Lipase mildly elevated though slightly improved since previous labs. Mild leukopenia, no neutropenia. Thrombocytopenia noted. UA shows high specific gravity and >1000 glucose, no signs of UTI.   CT consistent with [place], without evidence of obstruction or obvious cholecystitis.  CT also shows Mild bibasilar airspace opacities. Scattered diverticulosis along the ascending colon, without evidence of diverticulitis.  Enlarged prostate. Small left inguinal hernia, containing only fat.   Patient reexamined after CT. Patient currently asymptomatic for any of the above findings. No tenderness, distension, fever, or distress noted. Patient denies any urinary sxs.   Patient discussed with Dr. Eber Hong, who spoke with General Surgery group about patient. [facility] comfortable seeing patient on outpatient basis for suspected biliary colic. Plan discussed with patient and lab/imaging findings reviewed. Advised call ASAP for appointment with  [facility] group for evaluation within the next 2 days. Patient agrees with plan.  Patient given instructions to return if symptoms return or worsen. Discussed use of medicine as needed.   Meds given in ED:  Medications  iohexol (OMNIPAQUE) 300 MG/ML solution 50 mL (50 mLs Oral Contrast Given 12/20/12 0252)  iohexol (OMNIPAQUE) 300 MG/ML solution 100 mL (100 mLs Intravenous Contrast Given 12/20/12 0348)    Discharge Medication List as of 12/20/2012  5:53 AM    START taking these medications   Details  HYDROcodone-acetaminophen (NORCO/VICODIN) 5-325 MG per tablet Take 1 tablet by mouth every 4 (four) hours as needed., Starting 12/20/2012, Until Discontinued, Print    ondansetron (ZOFRAN) 4 MG tablet Take 1 tablet (4 mg total) by mouth every 8 (eight) hours as needed for nausea or vomiting., Starting 12/20/2012, Until Discontinued, Print               Rudene Anda, PA-C 12/20/12 1756

## 2012-12-20 ENCOUNTER — Emergency Department (HOSPITAL_COMMUNITY): Payer: Medicare Other

## 2012-12-20 ENCOUNTER — Encounter (HOSPITAL_COMMUNITY): Payer: Self-pay

## 2012-12-20 LAB — COMPREHENSIVE METABOLIC PANEL
ALT: 379 U/L — ABNORMAL HIGH (ref 0–53)
Albumin: 3.4 g/dL — ABNORMAL LOW (ref 3.5–5.2)
Calcium: 9.5 mg/dL (ref 8.4–10.5)
GFR calc Af Amer: >90 mL/min (ref >90)
Glucose, Bld: 175 mg/dL — ABNORMAL HIGH (ref 70–99)
Sodium: 136 mEq/L (ref 135–145)
Total Protein: 6.5 g/dL (ref 6.0–8.3)

## 2012-12-20 LAB — CBC WITH DIFFERENTIAL/PLATELET
Eosinophils Absolute: 0 10*3/uL (ref 0.0–0.7)
Hemoglobin: 15 g/dL (ref 13.0–17.0)
Lymphs Abs: 0.4 10*3/uL — ABNORMAL LOW (ref 0.7–4.0)
MCH: 31.3 pg (ref 26.0–34.0)
Monocytes Relative: 2 % — ABNORMAL LOW (ref 3–12)
Neutro Abs: 3.1 10*3/uL (ref 1.7–7.7)
Neutrophils Relative %: 85 % — ABNORMAL HIGH (ref 43–77)
RBC: 4.79 MIL/uL (ref 4.22–5.81)

## 2012-12-20 LAB — TROPONIN I: Troponin I: <0.3 ng/mL (ref <0.30)

## 2012-12-20 LAB — URINALYSIS, ROUTINE W REFLEX MICROSCOPIC
Glucose, UA: >1000 mg/dL — AB
Hgb urine dipstick: NEGATIVE
Specific Gravity, Urine: 1.035 — ABNORMAL HIGH (ref 1.005–1.030)

## 2012-12-20 LAB — URINE MICROSCOPIC-ADD ON

## 2012-12-20 MED ORDER — HYDROCODONE-ACETAMINOPHEN 5-325 MG PO TABS: 2.0000 | ORAL_TABLET | Freq: Once | ORAL | Status: DC

## 2012-12-20 MED ORDER — IOHEXOL 300 MG/ML  SOLN
50.0000 mL | Freq: Once | INTRAMUSCULAR | Status: AC | PRN
  Administered 2012-12-20: 50 mL via ORAL

## 2012-12-20 MED ORDER — IOHEXOL 300 MG/ML  SOLN
100.0000 mL | Freq: Once | INTRAMUSCULAR | Status: AC | PRN
  Administered 2012-12-20: 100 mL via INTRAVENOUS

## 2012-12-20 MED ORDER — HYDROCODONE-ACETAMINOPHEN 5-325 MG PO TABS: 1.0000 | ORAL_TABLET | ORAL | Status: DC | PRN

## 2012-12-20 MED ORDER — ONDANSETRON HCL 4 MG PO TABS: 4.0000 mg | ORAL_TABLET | Freq: Three times a day (TID) | ORAL | Status: DC | PRN

## 2012-12-20 NOTE — ED Provider Notes (Signed)
Pt is a 67 y/o male with htn, Dm, cholest and former smoker who had identified gall stone in March - presents with a complaint of epigastric and supraumbilical abdominal discomfort which she classifies as "weird". He had associated nausea but no vomiting. He does have a history of a normal HIDA scan but an ultrasound showing a gallstone approximately 6-8 months ago. On my exam he is a very soft abdomen with no reproducible tenderness, clear heart and lung sounds though he does have a mild tachycardia. There is no peripheral edema, no jaundice or icterus, clear oropharynx and moist mucous membranes. Possibility that this could be recurrent biliary colic, cholelithiasis, cholecystitis, pancreatitis though the patient has no history of gallbladder and does not a drinker. Labs pending, ultrasound pending. The patient appears hemodynamically stable. We'll also had troponin. EKG shows normal sinus rhythm, left axis deviation, poor R-wave progression but is completely unchanged from prior.   I have discussed the pt's findings with Dr. Daphine Deutscher of the surgical service who agrees that the pt can f/u - he has no acute findings on [place] or CT and has no ttp on exam and no abd pain at the end of his w/u - he can f/u in office - he has been informed of findigns and is amenable to f/u. Care discussed with the general surgeon who agrees with seeing the patient in the office.   Medical screening examination/treatment/procedure(s) were conducted as a shared visit with non-physician practitioner(s) and myself. I personally evaluated the patient during the encounter.   Clinical Impression: biliary colic, tranaminitis   Vida Roller, MD 12/20/12 [phone]

## 2012-12-20 NOTE — ED Provider Notes (Signed)
Pt is a 67 y/o male with htn, Dm, cholest and former smoker who had identified gall stone in March - presents with a complaint of epigastric and supraumbilical abdominal discomfort which she classifies as "weird". He had associated nausea but no vomiting. He does have a history of a normal HIDA scan but an ultrasound showing a gallstone approximately 6-8 months ago. On my exam he is a very soft abdomen with no reproducible tenderness, clear heart and lung sounds though he does have a mild tachycardia. There is no peripheral edema, no jaundice or icterus, clear oropharynx and moist mucous membranes. Possibility that this could be recurrent biliary colic, cholelithiasis, cholecystitis, pancreatitis though the patient has no history of gallbladder and does not a drinker. Labs pending, ultrasound pending. The patient appears hemodynamically stable. We'll also had troponin. EKG shows normal sinus rhythm, left axis deviation, poor R-wave progression but is completely unchanged from prior.  Care discussed with the general surgeon who agrees with seeing the patient in the office.  Medical screening examination/treatment/procedure(s) were conducted as a shared visit with non-physician practitioner(s) and myself.  I personally evaluated the patient during the encounter.  Clinical Impression: biliary colic, tranaminitis      Vida Roller, MD 12/20/12 [phone]

## 2012-12-22 ENCOUNTER — Inpatient Hospital Stay (HOSPITAL_COMMUNITY)
Admission: EM | Admit: 2012-12-22 | Discharge: 2012-12-25 | DRG: 853 | Disposition: A | Discharge status: Home / Self Care | Payer: Medicare Other | Attending: Pulmonary Disease | Admitting: Pulmonary Disease

## 2012-12-22 ENCOUNTER — Inpatient Hospital Stay (HOSPITAL_COMMUNITY): Payer: Medicare Other

## 2012-12-22 ENCOUNTER — Encounter (HOSPITAL_COMMUNITY)
Admission: EM | Disposition: A | Discharge status: Home / Self Care | Payer: Self-pay | Source: Home / Self Care | Attending: Pulmonary Disease

## 2012-12-22 ENCOUNTER — Encounter (HOSPITAL_COMMUNITY): Payer: Self-pay | Admitting: Emergency Medicine

## 2012-12-22 ENCOUNTER — Encounter (HOSPITAL_COMMUNITY): Payer: Self-pay | Admitting: Anesthesiology

## 2012-12-22 ENCOUNTER — Emergency Department (HOSPITAL_COMMUNITY): Payer: Medicare Other

## 2012-12-22 DIAGNOSIS — E7800 Pure hypercholesterolemia, unspecified: Secondary | ICD-10-CM

## 2012-12-22 DIAGNOSIS — E878 Other disorders of electrolyte and fluid balance, not elsewhere classified: Secondary | ICD-10-CM | POA: Diagnosis present

## 2012-12-22 DIAGNOSIS — D689 Coagulation defect, unspecified: Secondary | ICD-10-CM | POA: Diagnosis present

## 2012-12-22 DIAGNOSIS — K851 Biliary acute pancreatitis without necrosis or infection: Secondary | ICD-10-CM | POA: Diagnosis present

## 2012-12-22 DIAGNOSIS — Z794 Long term (current) use of insulin: Secondary | ICD-10-CM

## 2012-12-22 DIAGNOSIS — E876 Hypokalemia: Secondary | ICD-10-CM | POA: Diagnosis not present

## 2012-12-22 DIAGNOSIS — Z86010 Personal history of colonic polyps: Secondary | ICD-10-CM

## 2012-12-22 DIAGNOSIS — K8062 Calculus of gallbladder and bile duct with acute cholecystitis without obstruction: Secondary | ICD-10-CM | POA: Diagnosis present

## 2012-12-22 DIAGNOSIS — K4090 Unilateral inguinal hernia, without obstruction or gangrene, not specified as recurrent: Secondary | ICD-10-CM | POA: Diagnosis present

## 2012-12-22 DIAGNOSIS — A419 Sepsis, unspecified organism: Secondary | ICD-10-CM | POA: Diagnosis present

## 2012-12-22 DIAGNOSIS — E119 Type 2 diabetes mellitus without complications: Secondary | ICD-10-CM | POA: Diagnosis present

## 2012-12-22 DIAGNOSIS — K859 Acute pancreatitis without necrosis or infection, unspecified: Secondary | ICD-10-CM

## 2012-12-22 DIAGNOSIS — K8020 Calculus of gallbladder without cholecystitis without obstruction: Secondary | ICD-10-CM

## 2012-12-22 DIAGNOSIS — D72819 Decreased white blood cell count, unspecified: Secondary | ICD-10-CM

## 2012-12-22 DIAGNOSIS — E785 Hyperlipidemia, unspecified: Secondary | ICD-10-CM | POA: Diagnosis present

## 2012-12-22 DIAGNOSIS — K648 Other hemorrhoids: Secondary | ICD-10-CM

## 2012-12-22 DIAGNOSIS — K8050 Calculus of bile duct without cholangitis or cholecystitis without obstruction: Secondary | ICD-10-CM

## 2012-12-22 DIAGNOSIS — K8309 Other cholangitis: Secondary | ICD-10-CM

## 2012-12-22 DIAGNOSIS — Z833 Family history of diabetes mellitus: Secondary | ICD-10-CM

## 2012-12-22 DIAGNOSIS — D696 Thrombocytopenia, unspecified: Secondary | ICD-10-CM | POA: Diagnosis present

## 2012-12-22 DIAGNOSIS — I700 Atherosclerosis of aorta: Secondary | ICD-10-CM | POA: Diagnosis present

## 2012-12-22 DIAGNOSIS — Z7982 Long term (current) use of aspirin: Secondary | ICD-10-CM

## 2012-12-22 DIAGNOSIS — I10 Essential (primary) hypertension: Secondary | ICD-10-CM | POA: Diagnosis present

## 2012-12-22 DIAGNOSIS — Z79899 Other long term (current) drug therapy: Secondary | ICD-10-CM

## 2012-12-22 DIAGNOSIS — Z87891 Personal history of nicotine dependence: Secondary | ICD-10-CM

## 2012-12-22 DIAGNOSIS — K831 Obstruction of bile duct: Secondary | ICD-10-CM

## 2012-12-22 DIAGNOSIS — N179 Acute kidney failure, unspecified: Secondary | ICD-10-CM | POA: Diagnosis present

## 2012-12-22 DIAGNOSIS — J9819 Other pulmonary collapse: Secondary | ICD-10-CM | POA: Diagnosis present

## 2012-12-22 DIAGNOSIS — R1013 Epigastric pain: Secondary | ICD-10-CM | POA: Diagnosis present

## 2012-12-22 DIAGNOSIS — Z8249 Family history of ischemic heart disease and other diseases of the circulatory system: Secondary | ICD-10-CM

## 2012-12-22 DIAGNOSIS — K810 Acute cholecystitis: Secondary | ICD-10-CM | POA: Diagnosis not present

## 2012-12-22 DIAGNOSIS — N400 Enlarged prostate without lower urinary tract symptoms: Secondary | ICD-10-CM

## 2012-12-22 DIAGNOSIS — R7881 Bacteremia: Secondary | ICD-10-CM

## 2012-12-22 DIAGNOSIS — K5730 Diverticulosis of large intestine without perforation or abscess without bleeding: Secondary | ICD-10-CM | POA: Diagnosis present

## 2012-12-22 DIAGNOSIS — A4151 Sepsis due to Escherichia coli [E. coli]: Principal | ICD-10-CM | POA: Diagnosis present

## 2012-12-22 DIAGNOSIS — Z23 Encounter for immunization: Secondary | ICD-10-CM

## 2012-12-22 LAB — COMPREHENSIVE METABOLIC PANEL
ALT: 301 U/L — ABNORMAL HIGH (ref 0–53)
AST: 192 U/L — ABNORMAL HIGH (ref 0–37)
AST: 131 U/L — ABNORMAL HIGH (ref 0–37)
Albumin: 3.4 g/dL — ABNORMAL LOW (ref 3.5–5.2)
Albumin: 2.3 g/dL — ABNORMAL LOW (ref 3.5–5.2)
Alkaline Phosphatase: 249 U/L — ABNORMAL HIGH (ref 39–117)
Alkaline Phosphatase: 169 U/L — ABNORMAL HIGH (ref 39–117)
BUN: 27 mg/dL — ABNORMAL HIGH (ref 6–23)
CO2: 28 mEq/L (ref 19–32)
CO2: 20 mEq/L (ref 19–32)
Calcium: 9.6 mg/dL (ref 8.4–10.5)
Calcium: 7.5 mg/dL — ABNORMAL LOW (ref 8.4–10.5)
GFR calc Af Amer: 62 mL/min — ABNORMAL LOW (ref >90)
Glucose, Bld: 238 mg/dL — ABNORMAL HIGH (ref 70–99)
Potassium: 2.7 mEq/L — CL (ref 3.5–5.1)
Potassium: 2.6 mEq/L — CL (ref 3.5–5.1)
Sodium: 137 mEq/L (ref 135–145)
Sodium: 139 mEq/L (ref 135–145)
Total Protein: 7.1 g/dL (ref 6.0–8.3)
Total Protein: 4.9 g/dL — ABNORMAL LOW (ref 6.0–8.3)

## 2012-12-22 LAB — CBC WITH DIFFERENTIAL/PLATELET
Basophils Relative: 0 % (ref 0–1)
Eosinophils Absolute: 0 10*3/uL (ref 0.0–0.7)
Eosinophils Relative: 0 % (ref 0–5)
HCT: 42.6 % (ref 39.0–52.0)
Lymphocytes Relative: 6 % — ABNORMAL LOW (ref 12–46)
Lymphs Abs: 0.2 10*3/uL — ABNORMAL LOW (ref 0.7–4.0)
MCH: 31.6 pg (ref 26.0–34.0)
MCHC: 35.2 g/dL (ref 30.0–36.0)
MCV: 89.7 fL (ref 78.0–100.0)
Monocytes Relative: 3 % (ref 3–12)
Neutro Abs: 3.3 10*3/uL (ref 1.7–7.7)
Neutrophils Relative %: 90 % — ABNORMAL HIGH (ref 43–77)
Platelets: 103 10*3/uL — ABNORMAL LOW (ref 150–400)
RBC: 4.75 MIL/uL (ref 4.22–5.81)

## 2012-12-22 LAB — LACTIC ACID, PLASMA: Lactic Acid, Venous: 1.3 mmol/L (ref 0.5–2.2)

## 2012-12-22 LAB — LIPASE, BLOOD
Lipase: >3000 U/L — ABNORMAL HIGH (ref 11–59)
Lipase: >3000 U/L — ABNORMAL HIGH (ref 11–59)

## 2012-12-22 LAB — CARBOXYHEMOGLOBIN
Carboxyhemoglobin: 0.9 % (ref 0.5–1.5)
Methemoglobin: 1.3 % (ref 0.0–1.5)

## 2012-12-22 LAB — FIBRINOGEN: Fibrinogen: 436 mg/dL (ref 204–475)

## 2012-12-22 LAB — PROTIME-INR
INR: 1.02 (ref 0.00–1.49)
Prothrombin Time: 13.2 seconds (ref 11.6–15.2)

## 2012-12-22 LAB — URINALYSIS, ROUTINE W REFLEX MICROSCOPIC
Leukocytes, UA: NEGATIVE
Nitrite: NEGATIVE
Specific Gravity, Urine: 1.019 (ref 1.005–1.030)
Urobilinogen, UA: 1 mg/dL (ref 0.0–1.0)
pH: 6 (ref 5.0–8.0)

## 2012-12-22 LAB — MRSA PCR SCREENING: MRSA by PCR: NEGATIVE

## 2012-12-22 LAB — GLUCOSE, CAPILLARY
Glucose-Capillary: 137 mg/dL — ABNORMAL HIGH (ref 70–99)
Glucose-Capillary: 169 mg/dL — ABNORMAL HIGH (ref 70–99)
Glucose-Capillary: 87 mg/dL (ref 70–99)

## 2012-12-22 LAB — TROPONIN I: Troponin I: <0.3 ng/mL (ref <0.30)

## 2012-12-22 LAB — TYPE AND SCREEN: ABO/RH(D): B POS

## 2012-12-22 LAB — APTT: aPTT: 31 seconds (ref 24–37)

## 2012-12-22 LAB — ABO/RH: ABO/RH(D): B POS

## 2012-12-22 SURGERY — ERCP, WITH INTERVENTION IF INDICATED
Anesthesia: General

## 2012-12-22 MED ORDER — POTASSIUM CHLORIDE 10 MEQ/100ML IV SOLN
10.0000 meq | INTRAVENOUS | Status: AC
  Administered 2012-12-22 (×3): 10 meq via INTRAVENOUS
  Filled 2012-12-22 (×3): qty 100

## 2012-12-22 MED ORDER — IBUPROFEN 200 MG PO TABS
600.0000 mg | ORAL_TABLET | Freq: Once | ORAL | Status: AC
  Administered 2012-12-22: 600 mg via ORAL
  Filled 2012-12-22: qty 3

## 2012-12-22 MED ORDER — SODIUM CHLORIDE 0.9 % IV SOLN
INTRAVENOUS | Status: DC
  Administered 2012-12-22 – 2012-12-24 (×5): via INTRAVENOUS

## 2012-12-22 MED ORDER — SODIUM CHLORIDE 0.9 % IV BOLUS (SEPSIS): 1000.0000 mL | INTRAVENOUS | Status: DC | PRN

## 2012-12-22 MED ORDER — MORPHINE SULFATE 2 MG/ML IJ SOLN: 2.0000 mg | INTRAMUSCULAR | Status: DC | PRN

## 2012-12-22 MED ORDER — SODIUM CHLORIDE 0.9 % IV SOLN
1000.0000 mL | Freq: Once | INTRAVENOUS | Status: AC
  Administered 2012-12-22: 1000 mL via INTRAVENOUS

## 2012-12-22 MED ORDER — POTASSIUM CHLORIDE 10 MEQ/50ML IV SOLN
10.0000 meq | INTRAVENOUS | Status: AC
  Administered 2012-12-22 (×6): 10 meq via INTRAVENOUS
  Filled 2012-12-22 (×5): qty 50

## 2012-12-22 MED ORDER — SODIUM CHLORIDE 0.9 % IV SOLN: INTRAVENOUS | Status: DC

## 2012-12-22 MED ORDER — PIPERACILLIN-TAZOBACTAM 3.375 G IVPB
3.3750 g | Freq: Three times a day (TID) | INTRAVENOUS | Status: DC
  Administered 2012-12-22 – 2012-12-25 (×9): 3.375 g via INTRAVENOUS
  Filled 2012-12-22 (×11): qty 50

## 2012-12-22 MED ORDER — MIDAZOLAM HCL 2 MG/2ML IJ SOLN
INTRAMUSCULAR | Status: AC
  Filled 2012-12-22: qty 2

## 2012-12-22 MED ORDER — PNEUMOCOCCAL VAC POLYVALENT 25 MCG/0.5ML IJ INJ
0.5000 mL | INJECTION | INTRAMUSCULAR | Status: AC
  Administered 2012-12-23: 0.5 mL via INTRAMUSCULAR
  Filled 2012-12-22 (×2): qty 0.5

## 2012-12-22 MED ORDER — POTASSIUM CHLORIDE 10 MEQ/50ML IV SOLN
10.0000 meq | INTRAVENOUS | Status: AC
  Administered 2012-12-22 (×4): 10 meq via INTRAVENOUS
  Filled 2012-12-22 (×4): qty 50

## 2012-12-22 MED ORDER — FENTANYL CITRATE 0.05 MG/ML IJ SOLN
INTRAMUSCULAR | Status: AC
  Filled 2012-12-22: qty 5

## 2012-12-22 MED ORDER — PIPERACILLIN-TAZOBACTAM 3.375 G IVPB
3.3750 g | Freq: Once | INTRAVENOUS | Status: AC
  Administered 2012-12-22: 3.375 g via INTRAVENOUS
  Filled 2012-12-22: qty 50

## 2012-12-22 MED ORDER — PROPOFOL 10 MG/ML IV BOLUS
INTRAVENOUS | Status: AC
  Filled 2012-12-22: qty 20

## 2012-12-22 MED ORDER — SODIUM CHLORIDE 0.9 % IV SOLN
1000.0000 mL | INTRAVENOUS | Status: DC
  Administered 2012-12-22: 1000 mL via INTRAVENOUS

## 2012-12-22 MED ORDER — MORPHINE SULFATE 4 MG/ML IJ SOLN
6.0000 mg | Freq: Once | INTRAMUSCULAR | Status: AC
  Administered 2012-12-22: 6 mg via INTRAVENOUS
  Filled 2012-12-22: qty 2

## 2012-12-22 MED ORDER — SODIUM CHLORIDE 0.9 % IV BOLUS (SEPSIS)
1000.0000 mL | Freq: Once | INTRAVENOUS | Status: AC
  Administered 2012-12-22: 1000 mL via INTRAVENOUS

## 2012-12-22 MED ORDER — INSULIN ASPART 100 UNIT/ML ~~LOC~~ SOLN
2.0000 [IU] | SUBCUTANEOUS | Status: DC
  Administered 2012-12-22 (×2): 4 [IU] via SUBCUTANEOUS
  Administered 2012-12-23: 2 [IU] via SUBCUTANEOUS

## 2012-12-22 MED ORDER — SODIUM CHLORIDE 0.9 % IV SOLN
250.0000 mL | INTRAVENOUS | Status: DC | PRN
  Administered 2012-12-23: 09:00:00 via INTRAVENOUS

## 2012-12-22 MED ORDER — HEPARIN SODIUM (PORCINE) 5000 UNIT/ML IJ SOLN
5000.0000 [IU] | Freq: Three times a day (TID) | INTRAMUSCULAR | Status: DC
  Administered 2012-12-22 – 2012-12-24 (×6): 5000 [IU] via SUBCUTANEOUS
  Filled 2012-12-22 (×10): qty 1

## 2012-12-22 MED ORDER — NOREPINEPHRINE BITARTRATE 1 MG/ML IJ SOLN
5.0000 ug/min | INTRAVENOUS | Status: DC
  Administered 2012-12-22: 5 ug/min via INTRAVENOUS
  Administered 2012-12-23: 2 ug/min via INTRAVENOUS
  Filled 2012-12-22 (×2): qty 4

## 2012-12-22 MED ORDER — IOHEXOL 300 MG/ML  SOLN
100.0000 mL | Freq: Once | INTRAMUSCULAR | Status: AC | PRN
  Administered 2012-12-22: 100 mL via INTRAVENOUS

## 2012-12-22 NOTE — ED Provider Notes (Signed)
CSN: [id number]     Arrival date & time 12/22/12  0446 History   First MD Initiated Contact with Patient 12/22/12 0505     Chief Complaint  Patient presents with  . Abdominal Pain    HPI Patient presents the emergency department with worsening epigastric pain with associated nausea the past several days.  He was seen emergency room several days ago and had noted elevated liver function tests with cholelithiasis.  In no para cholecystic fluid at that time and the case was discussed with general surgery the patient was feeling better and therefore he was sent home.  Patient presents complaining of only epigastric discomfort.  States has not been constant over the past 48 hours.  His pain is moderate in severity at this time.  No back pain.  No chest pain or shortness of breath.   Past Medical History  Diagnosis Date  . Hypertension   . Hyperlipidemia   . UTI (lower urinary tract infection)   . Diabetes mellitus without complication    Past Surgical History  Procedure Laterality Date  . Hemorrhoid surgery  03/20/12    internal  . Diverticulitis    . Abdominal surgery    . Hernia repair     Family History  Problem Relation Age of Onset  . Cancer Mother     breast  . Hypertension Father   . Diabetes Brother    History  Substance Use Topics  . Smoking status: Former Smoker    Types: Cigarettes    Quit date: 11/20/1988  . Smokeless tobacco: Never Used  . Alcohol Use: No    Review of Systems  All other systems reviewed and are negative.    Allergies  Review of patient's allergies indicates no known allergies.  Home Medications   Current Outpatient Rx  Name  Route  Sig  Dispense  Refill  . aspirin EC 81 MG tablet   Oral   Take 81 mg by mouth every morning.          Marland Kitchen atorvastatin (LIPITOR) 40 MG tablet   Oral   Take 40 mg by mouth every evening.          . calcium-vitamin D (OSCAL WITH D) 500-200 MG-UNIT per tablet   Oral   Take 1 tablet by mouth every  morning.         . ergocalciferol (VITAMIN D2) 50000 UNITS capsule   Oral   Take 50,000 Units by mouth once a week. Thursday         . HYDROcodone-acetaminophen (NORCO/VICODIN) 5-325 MG per tablet   Oral   Take 1 tablet by mouth every 4 (four) hours as needed for moderate pain.         . indapamide (LOZOL) 2.5 MG tablet   Oral   Take 2.5 mg by mouth every morning.         . insulin glargine (LANTUS) 100 UNIT/ML injection   Subcutaneous   Inject 25 Units into the skin at bedtime.          . INVOKANA 300 MG TABS   Oral   Take 1 tablet by mouth every morning.          . pioglitazone (ACTOS) 45 MG tablet   Oral   Take 45 mg by mouth every morning.          . sitaGLIPtin (JANUVIA) 100 MG tablet   Oral   Take 100 mg by mouth every morning.          Marland Kitchen  valsartan (DIOVAN) 320 MG tablet   Oral   Take 320 mg by mouth every morning.          . metFORMIN (GLUCOPHAGE) 1000 MG tablet   Oral   Take 1,000 mg by mouth 2 (two) times daily with a meal.         . ondansetron (ZOFRAN) 4 MG tablet   Oral   Take 1 tablet (4 mg total) by mouth every 8 (eight) hours as needed for nausea or vomiting.   10 tablet   0    BP 143/61  Pulse 106  Temp(Src) 104 F (40 C) (Rectal)  Resp 29  Ht 5\' 8"  (1.727 m)  Wt 165 lb (74.844 kg)  BMI 25.09 kg/m2  SpO2 98% Physical Exam  Nursing note and vitals reviewed. Constitutional: He is oriented to person, place, and time. He appears well-developed and well-nourished.  HENT:  Head: Normocephalic and atraumatic.  Eyes: EOM are normal.  Neck: Normal range of motion.  Cardiovascular: Normal rate, regular rhythm, normal heart sounds and intact distal pulses.   Pulmonary/Chest: Effort normal and breath sounds normal. No respiratory distress.  Abdominal: Soft. He exhibits no distension.  Epigastric tenderness without peritonitis  Musculoskeletal: Normal range of motion.  Neurological: He is alert and oriented to person, place, and  time.  Skin: Skin is warm and dry.  Psychiatric: He has a normal mood and affect. Judgment normal.    ED Course  Procedures (including critical care time)  CRITICAL CARE Performed by: Lyanne Co Total critical care time: 32 Critical care time was exclusive of separately billable procedures and treating other patients. Critical care was necessary to treat or prevent imminent or life-threatening deterioration. Critical care was time spent personally by me on the following activities: development of treatment plan with patient and/or surrogate as well as nursing, discussions with consultants, evaluation of patient's response to treatment, examination of patient, obtaining history from patient or surrogate, ordering and performing treatments and interventions, ordering and review of laboratory studies, ordering and review of radiographic studies, pulse oximetry and re-evaluation of patient's condition.   Labs Review Labs Reviewed  CBC WITH DIFFERENTIAL - Abnormal; Notable for the following:    WBC 3.7 (*)    Platelets 103 (*)    Neutrophils Relative % 90 (*)    Lymphocytes Relative 6 (*)    Lymphs Abs 0.2 (*)    All other components within normal limits  COMPREHENSIVE METABOLIC PANEL - Abnormal; Notable for the following:    Potassium 2.7 (*)    Chloride 95 (*)    Glucose, Bld 238 (*)    BUN 27 (*)    Albumin 3.4 (*)    AST 192 (*)    ALT 465 (*)    Alkaline Phosphatase 249 (*)    Total Bilirubin 3.9 (*)    GFR calc non Af Amer 54 (*)    GFR calc Af Amer 62 (*)    All other components within normal limits  LIPASE, BLOOD - Abnormal; Notable for the following:    Lipase >3000 (*)    All other components within normal limits  URINALYSIS, ROUTINE W REFLEX MICROSCOPIC - Abnormal; Notable for the following:    Glucose, UA >1000 (*)    Hgb urine dipstick SMALL (*)    All other components within normal limits  URINE CULTURE  CULTURE, BLOOD (ROUTINE X 2)  CULTURE, BLOOD (ROUTINE  X 2)  URINE MICROSCOPIC-ADD ON  LACTIC ACID, PLASMA   Imaging  Review Dg Chest 2 View  12/22/2012   CLINICAL DATA:  Nausea, vomiting, epigastric pain and lower anterior chest pain. Weakness.  EXAM: CHEST  2 VIEW  COMPARISON:  Chest radiograph performed 03/28/2012  FINDINGS: The lungs are well-aerated. Mild left basilar opacity likely reflects atelectasis. There is no evidence of pleural effusion or pneumothorax.  The heart is normal in size; the mediastinal contour is within normal limits. No acute osseous abnormalities are seen.  IMPRESSION: Mild left basilar airspace opacity likely reflects atelectasis; lungs otherwise clear.   Electronically Signed   By: Roanna Raider M.D.   On: 12/22/2012 06:29   US Abdomen Limited Ruq  12/22/2012   CLINICAL DATA:  Right upper quadrant abdominal pain.  EXAM: US ABDOMEN LIMITED - RIGHT UPPER QUADRANT  COMPARISON:  CT of the abdomen and pelvis, and abdominal ultrasound, performed 12/20/2012  FINDINGS: Gallbladder  The gallbladder is distended. A few small stones are seen, measuring up to 0.6 cm in size. Mild gallbladder wall thickening is suggested; no pericholecystic fluid is seen. No ultrasonographic Murphy's sign is elicited.  Common bile duct  Diameter: 1.0 cm, dilated for the patient's age. Not well characterized distally.  Liver:  No focal lesion identified. Within normal limits in parenchymal echogenicity.  IMPRESSION: Distention of the gallbladder, with a few small stones noted in the gallbladder. Mild gallbladder wall thickening noted. Common hepatic duct dilated to 1.0 cm, new from the prior study. This raises concern for distal obstruction.   Electronically Signed   By: Roanna Raider M.D.   On: 12/22/2012 06:14  I personally reviewed the imaging tests through PACS system I reviewed available ER/hospitalization records through the EMR   EKG Interpretation   None       MDM   1. Choledocholithiasis   2. Acute cholangitis    Concerning for  cholecystitis.  Will valuate with labs and repeat ultrasound.  IV Zosyn now  6:48 AM Spoke with Dr Arty Baumgartner, GI, will see in hospital and schedule urgent ERCP. Agrees with abx. Vitals signs okay at this time. Good candidate for step down. May get worse before he gets better  7:23 AM Patient's heart rate is now elevated to 120.  Lactate pending.  Blood pressure remaining stable this time.  Mentation normal.  Patient feels better at this time.  I'm concerned that the patient's fever around her and 4 with what appears to be cholangitis has the potential to get significantly worse over the next several hours.  I talked with my intensive care team who will evaluate the patient in the emergency department for admission to the intensive care unit.  I'm concerned that after ERCP he could drastically worsen.  Family has been updated and are aware of my concerns.  Lyanne Co, MD 12/22/12 [phone]

## 2012-12-22 NOTE — Procedures (Signed)
Central Venous Catheter Insertion Procedure Note Kenneth [name] [id number] 12-11-1945  Procedure: Insertion of Central Venous Catheter Indications: Assessment of intravascular volume and Drug and/or fluid administration  Procedure Details Consent: Risks of procedure as well as the alternatives and risks of each were explained to the (patient/caregiver).  Consent for procedure obtained. Time Out: Verified patient identification, verified procedure, site/side was marked, verified correct patient position, special equipment/implants available, medications/allergies/relevent history reviewed, required imaging and test results available.  Performed  Maximum sterile technique was used including antiseptics, cap, gloves, gown, hand hygiene, mask and sheet. Skin prep: Chlorhexidine; local anesthetic administered A antimicrobial bonded/coated triple lumen catheter was placed in the right internal jugular vein using the Seldinger technique.  Ultrasound was used to verify the patency of the vein and for real time needle guidance.  Evaluation Blood flow good Complications: No apparent complications Patient did tolerate procedure well. Chest X-ray ordered to verify placement.  CXR: pending.  Kenneth [name] 12/22/2012, 10:14 AM

## 2012-12-22 NOTE — Progress Notes (Signed)
eLink Physician-Brief Progress Note Patient Name: DONIVEN VANPATTEN DOB: 11-Oct-1945 MRN: [id number]  Date of Service  12/22/2012   HPI/Events of Note   K 2.4  eICU Interventions   KCl 10 x 6, recheck   Intervention Category Intermediate Interventions: Electrolyte abnormality - evaluation and management  Beckett Maden 12/22/2012, 4:36 PM

## 2012-12-22 NOTE — Consult Note (Signed)
Reason for Consult:Choledocholithiasis, gallstone pancreatitis Referring Physician: Dr Charna Elizabeth  Kenneth [name] is an 67 y.o. male.  HPI: Presented to the EDwith worsening right upper cord or pain.  Was found to have elevated liver enzymes, bilirubin and lipase. CT scan shows distal common bile duct stone with pericholecystic fluid and fat stranding around the pancreas.  The patient denies any major discomfort. He does complain of being thirsty.  Past Medical History  Diagnosis Date  . Hypertension   . Hyperlipidemia   . UTI (lower urinary tract infection)   . Diabetes mellitus without complication     Past Surgical History  Procedure Laterality Date  . Hemorrhoid surgery  03/20/12    internal  . Diverticulitis    . Abdominal surgery    . Hernia repair      Family History  Problem Relation Age of Onset  . Cancer Mother     breast  . Hypertension Father   . Diabetes Brother     Social History:  reports that he quit smoking about 24 years ago. His smoking use included Cigarettes. He smoked 0.00 packs per day. He has never used smokeless tobacco. He reports that he does not drink alcohol or use illicit drugs.  Allergies: No Known Allergies  Medications: I have reviewed the patient's current medications.  Results for orders placed during the hospital encounter of 12/22/12 (from the past 48 hour(s))  CBC WITH DIFFERENTIAL     Status: Abnormal   Collection Time    12/22/12  5:25 AM      Result Value Range   WBC 3.7 (*) 4.0 - 10.5 K/uL   RBC 4.75  4.22 - 5.81 MIL/uL   Hemoglobin 15.0  13.0 - 17.0 g/dL   HCT 40.9  81.1 - 91.4 %   MCV 89.7  78.0 - 100.0 fL   MCH 31.6  26.0 - 34.0 pg   MCHC 35.2  30.0 - 36.0 g/dL   RDW 78.2  95.6 - 21.3 %   Platelets 103 (*) 150 - 400 K/uL   Comment: REPEATED TO VERIFY     SPECIMEN CHECKED FOR CLOTS     PLATELET COUNT CONFIRMED BY SMEAR   Neutrophils Relative % 90 (*) 43 - 77 %   Neutro Abs 3.3  1.7 - 7.7 K/uL   Lymphocytes Relative 6 (*)  12 - 46 %   Lymphs Abs 0.2 (*) 0.7 - 4.0 K/uL   Monocytes Relative 3  3 - 12 %   Monocytes Absolute 0.1  0.1 - 1.0 K/uL   Eosinophils Relative 0  0 - 5 %   Eosinophils Absolute 0.0  0.0 - 0.7 K/uL   Basophils Relative 0  0 - 1 %   Basophils Absolute 0.0  0.0 - 0.1 K/uL  COMPREHENSIVE METABOLIC PANEL     Status: Abnormal   Collection Time    12/22/12  5:25 AM      Result Value Range   Sodium 137  135 - 145 mEq/L   Potassium 2.7 (*) 3.5 - 5.1 mEq/L   Comment: CRITICAL RESULT CALLED TO, READ BACK BY AND VERIFIED WITH:     T.DOSTER RN AT 0630 ON 13DEC14 BY C.BONGEL   Chloride 95 (*) 96 - 112 mEq/L   CO2 28  19 - 32 mEq/L   Glucose, Bld 238 (*) 70 - 99 mg/dL   BUN 27 (*) 6 - 23 mg/dL   Creatinine, Ser 0.86  0.50 - 1.35 mg/dL   Calcium  9.6  8.4 - 10.5 mg/dL   Total Protein 7.1  6.0 - 8.3 g/dL   Albumin 3.4 (*) 3.5 - 5.2 g/dL   AST 409 (*) 0 - 37 U/L   ALT 465 (*) 0 - 53 U/L   Alkaline Phosphatase 249 (*) 39 - 117 U/L   Total Bilirubin 3.9 (*) 0.3 - 1.2 mg/dL   GFR calc non Af Amer 54 (*) >90 mL/min   GFR calc Af Amer 62 (*) >90 mL/min   Comment: (NOTE)     The eGFR has been calculated using the CKD EPI equation.     This calculation has not been validated in all clinical situations.     eGFR's persistently <90 mL/min signify possible Chronic Kidney     Disease.  LIPASE, BLOOD     Status: Abnormal   Collection Time    12/22/12  5:25 AM      Result Value Range   Lipase >3000 (*) 11 - 59 U/L   Comment: REPEATED TO VERIFY  URINALYSIS, ROUTINE W REFLEX MICROSCOPIC     Status: Abnormal   Collection Time    12/22/12  5:39 AM      Result Value Range   Color, Urine YELLOW  YELLOW   APPearance CLEAR  CLEAR   Specific Gravity, Urine 1.019  1.005 - 1.030   pH 6.0  5.0 - 8.0   Glucose, UA >1000 (*) NEGATIVE mg/dL   Hgb urine dipstick SMALL (*) NEGATIVE   Bilirubin Urine NEGATIVE  NEGATIVE   Ketones, ur NEGATIVE  NEGATIVE mg/dL   Protein, ur NEGATIVE  NEGATIVE mg/dL   Urobilinogen,  UA 1.0  0.0 - 1.0 mg/dL   Nitrite NEGATIVE  NEGATIVE   Leukocytes, UA NEGATIVE  NEGATIVE  URINE MICROSCOPIC-ADD ON     Status: None   Collection Time    12/22/12  5:39 AM      Result Value Range   RBC / HPF 0-2  <3 RBC/hpf  LACTIC ACID, PLASMA     Status: None   Collection Time    12/22/12  7:45 AM      Result Value Range   Lactic Acid, Venous 2.2  0.5 - 2.2 mmol/L  MRSA PCR SCREENING     Status: None   Collection Time    12/22/12  9:18 AM      Result Value Range   MRSA by PCR NEGATIVE  NEGATIVE   Comment:            The GeneXpert MRSA Assay (FDA     approved for NASAL specimens     only), is one component of a     comprehensive MRSA colonization     surveillance program. It is not     intended to diagnose MRSA     infection nor to guide or     monitor treatment for     MRSA infections.  TYPE AND SCREEN     Status: None   Collection Time    12/22/12 10:45 AM      Result Value Range   ABO/RH(D) B POS     Antibody Screen NEG     Sample Expiration 12/25/2012    LACTIC ACID, PLASMA     Status: None   Collection Time    12/22/12 10:45 AM      Result Value Range   Lactic Acid, Venous 1.3  0.5 - 2.2 mmol/L  CORTISOL     Status: None   Collection Time  12/22/12 10:45 AM      Result Value Range   Cortisol, Plasma 24.0     Comment: (NOTE)     AM:  4.3 - 22.4 ug/dL     PM:  3.1 - 16.1 ug/dL     Performed at [facility]  TROPONIN I     Status: None   Collection Time    12/22/12 10:45 AM      Result Value Range   Troponin I <0.30  <0.30 ng/mL   Comment:            Due to the release kinetics of cTnI,     a negative result within the first hours     of the onset of symptoms does not rule out     myocardial infarction with certainty.     If myocardial infarction is still suspected,     repeat the test at appropriate intervals.  PROTIME-INR     Status: None   Collection Time    12/22/12 10:45 AM      Result Value Range   Prothrombin Time 13.2  11.6 - 15.2  seconds   INR 1.02  0.00 - 1.49  APTT     Status: None   Collection Time    12/22/12 10:45 AM      Result Value Range   aPTT 31  24 - 37 seconds  FIBRINOGEN     Status: None   Collection Time    12/22/12 10:45 AM      Result Value Range   Fibrinogen 436  204 - 475 mg/dL  ABO/RH     Status: None   Collection Time    12/22/12 10:45 AM      Result Value Range   ABO/RH(D) B POS    LIPASE, BLOOD     Status: Abnormal   Collection Time    12/22/12 12:15 PM      Result Value Range   Lipase >3000 (*) 11 - 59 U/L   Comment: REPEATED TO VERIFY  COMPREHENSIVE METABOLIC PANEL     Status: Abnormal   Collection Time    12/22/12 12:15 PM      Result Value Range   Sodium 139  135 - 145 mEq/L   Potassium 2.6 (*) 3.5 - 5.1 mEq/L   Comment: CRITICAL RESULT CALLED TO, READ BACK BY AND VERIFIED WITH:     M.MORGAN RN AT 1303 ON 13DEC14 BY C.BONGEL   Chloride 108  96 - 112 mEq/L   Comment: DELTA CHECK NOTED   CO2 20  19 - 32 mEq/L   Glucose, Bld 158 (*) 70 - 99 mg/dL   BUN 24 (*) 6 - 23 mg/dL   Creatinine, Ser 0.96  0.50 - 1.35 mg/dL   Calcium 7.5 (*) 8.4 - 10.5 mg/dL   Total Protein 4.9 (*) 6.0 - 8.3 g/dL   Albumin 2.3 (*) 3.5 - 5.2 g/dL   AST 045 (*) 0 - 37 U/L   ALT 301 (*) 0 - 53 U/L   Alkaline Phosphatase 169 (*) 39 - 117 U/L   Total Bilirubin 3.4 (*) 0.3 - 1.2 mg/dL   GFR calc non Af Amer 57 (*) >90 mL/min   GFR calc Af Amer 66 (*) >90 mL/min   Comment: (NOTE)     The eGFR has been calculated using the CKD EPI equation.     This calculation has not been validated in all clinical situations.     eGFR's persistently <90 mL/min  signify possible Chronic Kidney     Disease.  GLUCOSE, CAPILLARY     Status: Abnormal   Collection Time    12/22/12 12:19 PM      Result Value Range   Glucose-Capillary 137 (*) 70 - 99 mg/dL  CARBOXYHEMOGLOBIN     Status: Abnormal   Collection Time    12/22/12  1:00 PM      Result Value Range   Total hemoglobin 12.6 (*) 13.5 - 18.0 g/dL   O2 Saturation  16.1     Carboxyhemoglobin 0.9  0.5 - 1.5 %   Methemoglobin 1.3  0.0 - 1.5 %  POTASSIUM     Status: Abnormal   Collection Time    12/22/12  1:40 PM      Result Value Range   Potassium 2.4 (*) 3.5 - 5.1 mEq/L   Comment: CRITICAL RESULT CALLED TO, READ BACK BY AND VERIFIED WITH:     Flavia Shipper RN 0960 12/22/12 A [facility]  LIPASE, BLOOD     Status: Abnormal   Collection Time    12/22/12  1:40 PM      Result Value Range   Lipase >3000 (*) 11 - 59 U/L   Comment: REPEATED TO VERIFY  GLUCOSE, CAPILLARY     Status: Abnormal   Collection Time    12/22/12  4:45 PM      Result Value Range   Glucose-Capillary 169 (*) 70 - 99 mg/dL    Dg Chest 2 View  45/40/9811   CLINICAL DATA:  Nausea, vomiting, epigastric pain and lower anterior chest pain. Weakness.  EXAM: CHEST  2 VIEW  COMPARISON:  Chest radiograph performed 03/28/2012  FINDINGS: The lungs are well-aerated. Mild left basilar opacity likely reflects atelectasis. There is no evidence of pleural effusion or pneumothorax.  The heart is normal in size; the mediastinal contour is within normal limits. No acute osseous abnormalities are seen.  IMPRESSION: Mild left basilar airspace opacity likely reflects atelectasis; lungs otherwise clear.   Electronically Signed   By: Roanna Raider M.D.   On: 12/22/2012 06:29   Ct Abdomen W Contrast  12/22/2012   CLINICAL DATA:  Cholelithiasis with minimal suspected gallstone pancreatitis, possible choledocholithiasis or cholangitis  EXAM: CT ABDOMEN WITH CONTRAST  TECHNIQUE: Multidetector CT imaging of the abdomen was performed using the standard protocol following bolus administration of intravenous contrast.  CONTRAST:  OMNIPAQUE IOHEXOL 300 MG/ML  SOLN  COMPARISON:  12/20/2012  FINDINGS: Trace bilateral pleural effusions. Associated lower lobe opacities, likely atelectasis.  Liver is notable for altered perfusion/hyperemia along the gallbladder fossa (Series 2/image 29). Additional perfusional changes  involving the medial segment left hepatic lobe (series 2/ image 23) and posterior right hepatic dome (series 2/ image 17).  Spleen and adrenal glands are within normal limits.  Mild peripancreatic inflammatory changes along the body/tail (series 2/ image 33), compatible with reported history of acute pancreatitis. No evidence of pancreatic necrosis or hemorrhage. No peripancreatic fluid collection/abscess.  Distended gallbladder with mild pericholecystic fluid (series 2/image 28) and early gallbladder wall thickening (series 2/image 31). Layering small gallstones in the gallbladder neck (series 2/images 34 and 35). Mild periportal edema. No intrahepatic or extrahepatic ductal dilatation. Common duct measures 7 mm, at the upper limits of normal, and is notable for a 3 mm distal CBD stone at the ampulla (series 2/image 39).  Kidneys are within normal limits.  No hydronephrosis.  Visualized bowel is unremarkable.  Atherosclerotic calcifications of the abdominal aorta and branch vessels.  No  abdominal ascites.  No suspicious abdominal lymphadenopathy.  Degenerative changes of the visualized thoracolumbar spine.  IMPRESSION: Acute pancreatitis. No evidence of pancreatic necrosis or hemorrhage. No peripancreatic fluid collection/abscess.  Cholelithiasis. Early/developing acute cholecystitis is possible, new from prior ultrasound. Consider hepatobiliary nuclear medicine scan for further evaluation as clinically warranted.  Choledocholithiasis with a 3 mm distal CBD stone just above the ampulla. Common duct measures 7 mm, at the upper limits of normal.  These results were called by telephone at the time of interpretation on 12/22/2012 at 6:35 PM to Dr. Charna Elizabeth , who verbally acknowledged these results.   Electronically Signed   By: Charline Bills M.D.   On: 12/22/2012 18:45   Dg Chest Port 1 View  12/22/2012   CLINICAL DATA:  Central line placement  EXAM: PORTABLE CHEST - 1 VIEW  COMPARISON:  12/22/2012, 602 hrs   FINDINGS: Cardiac shadow is stable. A new right-sided jugular central line is noted with the tip in the mid superior vena cava. No pneumothorax is noted. Mild basilar atelectatic changes are noted  IMPRESSION: No evidence of pneumothorax following central line placement.   Electronically Signed   By: Alcide Clever M.D.   On: 12/22/2012 10:37   US Abdomen Limited Ruq  12/22/2012   CLINICAL DATA:  Right upper quadrant abdominal pain.  EXAM: US ABDOMEN LIMITED - RIGHT UPPER QUADRANT  COMPARISON:  CT of the abdomen and pelvis, and abdominal ultrasound, performed 12/20/2012  FINDINGS: Gallbladder  The gallbladder is distended. A few small stones are seen, measuring up to 0.6 cm in size. Mild gallbladder wall thickening is suggested; no pericholecystic fluid is seen. No ultrasonographic Murphy's sign is elicited.  Common bile duct  Diameter: 1.0 cm, dilated for the patient's age. Not well characterized distally.  Liver:  No focal lesion identified. Within normal limits in parenchymal echogenicity.  IMPRESSION: Distention of the gallbladder, with a few small stones noted in the gallbladder. Mild gallbladder wall thickening noted. Common hepatic duct dilated to 1.0 cm, new from the prior study. This raises concern for distal obstruction.   Electronically Signed   By: Roanna Raider M.D.   On: 12/22/2012 06:14    Review of Systems  Constitutional: Negative for fever and chills.  Respiratory: Negative for cough and shortness of breath.   Cardiovascular: Negative for chest pain.  Gastrointestinal: Positive for abdominal pain. Negative for nausea and vomiting.  Genitourinary: Negative for dysuria.  Neurological: Negative for headaches.   Blood pressure 108/71, pulse 56, temperature 97.9 F (36.6 C), temperature source Oral, resp. rate 19, height 5\' 8"  (1.727 m), weight 165 lb (74.844 kg), SpO2 98.00%. Physical Exam  Constitutional: He is oriented to person, place, and time. He appears well-nourished. No  distress.  HENT:  Head: Normocephalic and atraumatic.  Eyes: Pupils are equal, round, and reactive to light.  Neck: Normal range of motion.  Cardiovascular: Normal rate and regular rhythm.   Respiratory: Effort normal and breath sounds normal.  GI: Soft. He exhibits no distension.  Neurological: He is alert and oriented to person, place, and time.  Skin: Skin is warm and dry. He is not diaphoretic.    Assessment/Plan: This is a 67 year old male who appears to have gallstone pancreatitis. There is a distal common bile duct obstruction. GI is following the patient and will perform ERCP when they feel it is safe to do so. Patient will most likely need an interval cholecystectomy in the future.  If he does resolve his pancreatitis quickly, we  may be up to do this while he is in the hospital.  Maisie Fus, Helmut Muster C. 12/22/2012, 8:42 PM

## 2012-12-22 NOTE — H&P (Signed)
PULMONARY  / CRITICAL CARE MEDICINE  Name: Kenneth [name] MRN: [id number] DOB: [birth year]    ADMISSION DATE:  12/22/2012 CONSULTATION DATE:  12/22/2012  REFERRING MD : Patria Mane, EDP PRIMARY SERVICE: PCCM  CHIEF COMPLAINT:  Abdomen  BRIEF PATIENT DESCRIPTION: 67 y/o male was admitted from the [facility] ED on 12/13 with septic shock from cholangitis and gallstone pancreatitis.  SIGNIFICANT EVENTS / STUDIES:  12/13 RUQ ultrasound > gallbladder distension with wall thickening and small stones, common hepatic duct dilated to 1.0 cm  LINES / TUBES: 12/13 R IJ CVL >  CULTURES: 12/13 blood culture >>  ANTIBIOTICS: 12/13 zosyn >>  HISTORY OF PRESENT ILLNESS:  67 y/o male was admitted from the [facility] ED on 12/13 with septic shock from cholangitis and gallstone pancreatitis. He developed epigastric pain on 12/10 and came to the ED for evaluation. He had elevated LFTs and some gallbladder wall thickening on an ultrasound with a normal belly exam and a normal CT abdomen.  He was discharged home.  On 12/12 he ate fried chicken for dinner and developed worsening epigastric pain overnight and came to the ED early in the morning on 12/13.  He had a fever on arrival.  PCCM consulted for hypotension.  GI medicine had been contacted for possible ERCP.  PAST MEDICAL HISTORY :  Past Medical History  Diagnosis Date  . Hypertension   . Hyperlipidemia   . UTI (lower urinary tract infection)   . Diabetes mellitus without complication    Past Surgical History  Procedure Laterality Date  . Hemorrhoid surgery  03/20/12    internal  . Diverticulitis    . Abdominal surgery    . Hernia repair     Prior to Admission medications   Medication Sig Start Date End Date Taking? Authorizing Provider  aspirin EC 81 MG tablet Take 81 mg by mouth every morning.    Yes Historical Provider, MD  atorvastatin (LIPITOR) 40 MG tablet Take 40 mg by mouth every evening.    Yes Historical Provider, MD  calcium-vitamin D (OSCAL WITH  D) 500-200 MG-UNIT per tablet Take 1 tablet by mouth every morning.   Yes Historical Provider, MD  ergocalciferol (VITAMIN D2) 50000 UNITS capsule Take 50,000 Units by mouth once a week. Thursday   Yes Historical Provider, MD  HYDROcodone-acetaminophen (NORCO/VICODIN) 5-325 MG per tablet Take 1 tablet by mouth every 4 (four) hours as needed for moderate pain.   Yes Historical Provider, MD  indapamide (LOZOL) 2.5 MG tablet Take 2.5 mg by mouth every morning.   Yes Historical Provider, MD  insulin glargine (LANTUS) 100 UNIT/ML injection Inject 25 Units into the skin at bedtime.    Yes Historical Provider, MD  INVOKANA 300 MG TABS Take 1 tablet by mouth every morning.  11/08/11  Yes Historical Provider, MD  pioglitazone (ACTOS) 45 MG tablet Take 45 mg by mouth every morning.  09/05/11  Yes Historical Provider, MD  sitaGLIPtin (JANUVIA) 100 MG tablet Take 100 mg by mouth every morning.    Yes Historical Provider, MD  valsartan (DIOVAN) 320 MG tablet Take 320 mg by mouth every morning.    Yes Historical Provider, MD  metFORMIN (GLUCOPHAGE) 1000 MG tablet Take 1,000 mg by mouth 2 (two) times daily with a meal.    Historical Provider, MD  ondansetron (ZOFRAN) 4 MG tablet Take 1 tablet (4 mg total) by mouth every 8 (eight) hours as needed for nausea or vomiting. 12/20/12   Rudene Anda, PA-C   No Known  Allergies  FAMILY HISTORY:  Family History  Problem Relation Age of Onset  . Cancer Mother     breast  . Hypertension Father   . Diabetes Brother    SOCIAL HISTORY:  reports that he quit smoking about 24 years ago. His smoking use included Cigarettes. He smoked 0.00 packs per day. He has never used smokeless tobacco. He reports that he does not drink alcohol or use illicit drugs.  REVIEW OF SYSTEMS:   Gen: ++fever, chills, denies weight change, fatigue, night sweats HEENT: Denies blurred vision, double vision, hearing loss, tinnitus, sinus congestion, rhinorrhea, sore throat, neck stiffness,  dysphagia PULM: Denies shortness of breath, cough, sputum production, hemoptysis, wheezing CV: Denies chest pain, edema, orthopnea, paroxysmal nocturnal dyspnea, palpitations GI: per HPI GU: Denies dysuria, hematuria, polyuria, oliguria, urethral discharge Endocrine: Denies hot or cold intolerance, polyuria, polyphagia or appetite change Derm: Denies rash, dry skin, scaling or peeling skin change Heme: Denies easy bruising, bleeding, bleeding gums Neuro: Denies headache, numbness, weakness, slurred speech, loss of memory or consciousness   SUBJECTIVE:   VITAL SIGNS: Temp:  [99.8 F (37.7 C)-104 F (40 C)] 99.8 F (37.7 C) (12/13 0725) Pulse Rate:  [98-118] 118 (12/13 0700) Resp:  [17-32] 32 (12/13 0700) BP: (109-143)/(58-64) 132/61 mmHg (12/13 0700) SpO2:  [90 %-98 %] 97 % (12/13 0700) Weight:  [74.844 kg (165 lb)] 74.844 kg (165 lb) (12/13 0500) HEMODYNAMICS:   VENTILATOR SETTINGS:   INTAKE / OUTPUT: Intake/Output     12/12 0701 - 12/13 0700 12/13 0701 - 12/14 0700   Urine (mL/kg/hr) 400    Total Output 400     Net -400            PHYSICAL EXAMINATION:  Gen: no acute distress HEENT: NCAT, PERRL, EOMi, OP clear, neck supple without masses PULM: CTA B CV: RRR, no mgr, no JVD AB: BS+, soft, no tenderness on exam RUQ or epigastric area Ext: warm, no edema, no clubbing, no cyanosis Derm: no rash or skin breakdown Neuro: A&Ox4, CN II-XII intact, MAEW   LABS:  CBC  Recent Labs Lab 12/20/12 0117 12/22/12 0525  WBC 3.6* 3.7*  HGB 15.0 15.0  HCT 43.8 42.6  PLT 138* 103*   Coag's No results found for this basename: APTT, INR,  in the last 168 hours BMET  Recent Labs Lab 12/20/12 0117 12/22/12 0525  NA 136 137  K 3.2* 2.7*  CL 94* 95*  CO2 28 28  BUN 25* 27*  CREATININE 0.89 1.35  GLUCOSE 175* 238*   Electrolytes  Recent Labs Lab 12/20/12 0117 12/22/12 0525  CALCIUM 9.5 9.6   Sepsis Markers No results found for this basename: LATICACIDVEN,  PROCALCITON, O2SATVEN,  in the last 168 hours ABG No results found for this basename: PHART, PCO2ART, PO2ART,  in the last 168 hours Liver Enzymes  Recent Labs Lab 12/20/12 0117 12/22/12 0525  AST 678* 192*  ALT 379* 465*  ALKPHOS 286* 249*  BILITOT 2.5* 3.9*  ALBUMIN 3.4* 3.4*   Cardiac Enzymes  Recent Labs Lab 12/20/12 0117  TROPONINI <0.30   Glucose No results found for this basename: GLUCAP,  in the last 168 hours    CXR: low lung volumes, bibasilar atelectasis  ASSESSMENT / PLAN:  PULMONARY A: No acute issues, some atelectasis likely due to splinting from pain P:   -O2 as needed  CARDIOVASCULAR A: Septic shock P:  -place CVL now -sepsis protocol to guide crystalloid resuscitation -CVP etc -Levophed for MAP > 65  RENAL A:  Normal renal function but Cr up from 2 days ago, likely due to poor po intake and sepsis P:   -aggressive IVF  GASTROINTESTINAL A:  Gallstone pancreatitis and cholangitis (fever, elevated bili, dilated CBD) Abdominal pain from pancreatitis P:   -GI consulted for possible ERCP -NPO -morphine for pain -aggressive IVF  HEMATOLOGIC A:  Mild thrombocytopenia P:  -monitor for bleeding  INFECTIOUS A:  Septic shock, cholangitis P:  -agree with zosyn per pharmacy -f/u culture  ENDOCRINE A:  DM2 P:   -ICU hyperglycemia protocol  NEUROLOGIC A:  No acute issues P:   -morphine for pain  TODAY'S SUMMARY:   I have personally obtained a history, examined the patient, evaluated laboratory and imaging results, formulated the assessment and plan and placed orders. CRITICAL CARE: The patient is critically ill with multiple organ systems failure and requires high complexity decision making for assessment and support, frequent evaluation and titration of therapies, application of advanced monitoring technologies and extensive interpretation of multiple databases. Critical Care Time devoted to patient care services described in this  note is 40 minutes.   Yolonda Kida PCCM Pager: [phone] Cell: [phone] If no response, call [phone]   12/22/2012, 8:07 AM

## 2012-12-22 NOTE — ED Notes (Addendum)
Critical Care MD at bedside.  

## 2012-12-22 NOTE — ED Notes (Signed)
MD at bedside. 

## 2012-12-22 NOTE — Progress Notes (Signed)
CRITICAL VALUE ALERT  Critical value received   K  2.4  Date of notification:12/22/2012  Time of notification: 1632  Critical value read back:yes  Nurse who received alert: Volanda Napoleon RN MD notified (1st page): E Link   Time of first [phone] MD notified (2nd page):  Time of second page:  Responding MD:    Time MD responded:

## 2012-12-22 NOTE — ED Notes (Signed)
Per Daughter pt c/o abd pain, nausea. Per daughter pt weak, does not seem right. Pt recently admitted for sepsis ?Ecoli from Gallstone. Pt appears jaundice in triage, falling asleep during interview.

## 2012-12-22 NOTE — ED Notes (Signed)
[place] completed Patient taken to CXR via stretcher

## 2012-12-22 NOTE — Progress Notes (Addendum)
ANTIBIOTIC CONSULT NOTE - INITIAL  Pharmacy Consult for Zosyn  Indication: Cholangitis   No Known Allergies  Patient Measurements: Height: 5\' 8"  (172.7 cm) Weight: 165 lb (74.844 kg) IBW/kg (Calculated) : 68.4   Vital Signs: Temp: 101.9 F (38.8 C) (12/13 0900) Temp src: Axillary (12/13 0900) BP: 79/40 mmHg (12/13 0931) Pulse Rate: 96 (12/13 0931) Intake/Output from previous day: 12/12 0701 - 12/13 0700 In: -  Out: 400 [Urine:400] Intake/Output from this shift: Total I/O In: -  Out: 200 [Urine:200]  Labs:  Recent Labs  12/20/12 0117 12/22/12 0525  WBC 3.6* 3.7*  HGB 15.0 15.0  PLT 138* 103*  CREATININE 0.89 1.35   Estimated Creatinine Clearance: 52.8 ml/min (by C-G formula based on Cr of 1.35). No results found for this basename: VANCOTROUGH, VANCOPEAK, VANCORANDOM, GENTTROUGH, GENTPEAK, GENTRANDOM, TOBRATROUGH, TOBRAPEAK, TOBRARND, AMIKACINPEAK, AMIKACINTROU, AMIKACIN,  in the last 72 hours   Microbiology: No results found for this or any previous visit (from the past 720 hour(s)).  Medical History: Past Medical History  Diagnosis Date  . Hypertension   . Hyperlipidemia   . UTI (lower urinary tract infection)   . Diabetes mellitus without complication     Medications:  Scheduled:  . heparin  5,000 Units Subcutaneous Q8H  . insulin aspart  2-6 Units Subcutaneous Q4H  . potassium chloride  10 mEq Intravenous Q1 Hr x 3  . potassium chloride  10 mEq Intravenous Q1 Hr x 4   Infusions:  . sodium chloride 150 mL/hr at 12/22/12 0930   PRN: sodium chloride, morphine injection  Assessment: 67 y/o male was admitted from the [facility] ED on 12/13 with septic shock from cholangitis and gallstone pancreatitis to start Zosyn per pharmacy.  Plans per GI for urgent ERCP  Patients Scr has trended up 1.35 < 0.89 - CrCl Currently 53 ml/min, will monitor  WBC 3.7  Febrile with temperature 101.9  Urine and blood cultures sent    Goal of Therapy:  Zosyn per renal  function Antibiotic adjustment per renal function   Plan:  1.) Zosyn 3.375 grams IV q8h, infuse over 4 hours 2.) Monitor renal function  3.) f/u cultures  Iris Tatsch, Loma Messing PharmD Pager #: [phone] 10:12 AM 12/22/2012

## 2012-12-22 NOTE — Consult Note (Signed)
Cross cover [facility] Reason for Consult: Gallstones with cholangitis.  Referring Physician: Dr. Harrell Kenneth [name] is an 67 y.o. male.  HPI: 67 year old [place] male, who was in his USOH, till 3 days ago, when he developed fever, chills and epigastric pain at about 10:30 PM on 12/19/12. Came to the ER here at [facility] and was sent home after an evaluation revealed a Total bilirubin of 2.5 gm/dl along with eleavted transaminases with plans to follow up with Dr. Abbey Chatters on 12/25/12 on an OP basis. He was found to have cholelithiasis earlier this year after he developed E. Coli sepsis after a hemorrhoidectomy and sclerotherapy done by Dr. Benedetto [name]. He was advised to have a cholecystectomy but the patient felt he was too weak to undergo any more procedures at that time. He felt reasonably well till last night again, when he started having shaking chills with a fever of 104 F. He was then brought back to the ER by his daughter Kenneth [name] [phone] and further evaluation here revealed a dilated bile duct of 1 cm with cholelithiasis and a Lipase of 3000. Patient denies frank abdominal pain but gives a history of "gastric discomfort" since he finished dinner last night. He is followed by Dr. Juanita Laster for his diabetes and his last A1c was 7.8%.   Past Medical History  Diagnosis Date  . Hypertension   . Hyperlipidemia   . UTI   . Diabetes mellitus without complication        BPH with elevated PSA      Hemorrhoids      Cholelithiasis      Colonic polyps      Diverticulosis      Tobacco abuse-50 pack year history  Past Surgical History  Procedure Laterality Date  . Hemorrhoid surgery  03/20/12    internal  . Diverticulitis    . Abdominal surgery    . Hernia repair     Family History  Problem Relation Age of Onset  . Cancer Mother     breast  . Hypertension Father   . Diabetes Brother    Social History:  reports that he quit smoking about 24 years ago. He smoked 2.5  packs a day for 17 years from the age of [age] {over 50 pack year history of smoking]. He has not smoked since then. He reports that he drink alcohol only on social occasions, 1-2 beers at a time, 1-2 tr r use illicit drugs.  Allergies: No Known Allergies  Medications: I have reviewed the patient's current medications.  Results for orders placed during the hospital encounter of 12/22/12 (from the past 48 hour(s))  CBC WITH DIFFERENTIAL     Status: Abnormal   Collection Time    12/22/12  5:25 AM      Result Value Range   WBC 3.7 (*) 4.0 - 10.5 K/uL   RBC 4.75  4.22 - 5.81 MIL/uL   Hemoglobin 15.0  13.0 - 17.0 g/dL   HCT 09.8  11.9 - 14.7 %   MCV 89.7  78.0 - 100.0 fL   MCH 31.6  26.0 - 34.0 pg   MCHC 35.2  30.0 - 36.0 g/dL   RDW 82.9  56.2 - 13.0 %   Platelets 103 (*) 150 - 400 K/uL   Comment: REPEATED TO VERIFY     SPECIMEN CHECKED FOR CLOTS     PLATELET COUNT CONFIRMED BY SMEAR   Neutrophils Relative % 90 (*) 43 - 77 %  Neutro Abs 3.3  1.7 - 7.7 K/uL   Lymphocytes Relative 6 (*) 12 - 46 %   Lymphs Abs 0.2 (*) 0.7 - 4.0 K/uL   Monocytes Relative 3  3 - 12 %   Monocytes Absolute 0.1  0.1 - 1.0 K/uL   Eosinophils Relative 0  0 - 5 %   Eosinophils Absolute 0.0  0.0 - 0.7 K/uL   Basophils Relative 0  0 - 1 %   Basophils Absolute 0.0  0.0 - 0.1 K/uL  COMPREHENSIVE METABOLIC PANEL     Status: Abnormal   Collection Time    12/22/12  5:25 AM      Result Value Range   Sodium 137  135 - 145 mEq/L   Potassium 2.7 (*) 3.5 - 5.1 mEq/L   Comment: CRITICAL RESULT CALLED TO, READ BACK BY AND VERIFIED WITH:     T.DOSTER RN AT 0630 ON 13DEC14 BY C.BONGEL   Chloride 95 (*) 96 - 112 mEq/L   CO2 28  19 - 32 mEq/L   Glucose, Bld 238 (*) 70 - 99 mg/dL   BUN 27 (*) 6 - 23 mg/dL   Creatinine, Ser 1.61  0.50 - 1.35 mg/dL   Calcium 9.6  8.4 - 09.6 mg/dL   Total Protein 7.1  6.0 - 8.3 g/dL   Albumin 3.4 (*) 3.5 - 5.2 g/dL   AST 045 (*) 0 - 37 U/L   ALT 465 (*) 0 - 53 U/L   Alkaline  Phosphatase 249 (*) 39 - 117 U/L   Total Bilirubin 3.9 (*) 0.3 - 1.2 mg/dL   GFR calc non Af Amer 54 (*) >90 mL/min   GFR calc Af Amer 62 (*) >90 mL/min   Comment: (NOTE)     The eGFR has been calculated using the CKD EPI equation.     This calculation has not been validated in all clinical situations.     eGFR's persistently <90 mL/min signify possible Chronic Kidney     Disease.  LIPASE, BLOOD     Status: Abnormal   Collection Time    12/22/12  5:25 AM      Result Value Range   Lipase >3000 (*) 11 - 59 U/L   Comment: REPEATED TO VERIFY  URINALYSIS, ROUTINE W REFLEX MICROSCOPIC     Status: Abnormal   Collection Time    12/22/12  5:39 AM      Result Value Range   Color, Urine YELLOW  YELLOW   APPearance CLEAR  CLEAR   Specific Gravity, Urine 1.019  1.005 - 1.030   pH 6.0  5.0 - 8.0   Glucose, UA >1000 (*) NEGATIVE mg/dL   Hgb urine dipstick SMALL (*) NEGATIVE   Bilirubin Urine NEGATIVE  NEGATIVE   Ketones, ur NEGATIVE  NEGATIVE mg/dL   Protein, ur NEGATIVE  NEGATIVE mg/dL   Urobilinogen, UA 1.0  0.0 - 1.0 mg/dL   Nitrite NEGATIVE  NEGATIVE   Leukocytes, UA NEGATIVE  NEGATIVE  URINE MICROSCOPIC-ADD ON     Status: None   Collection Time    12/22/12  5:39 AM      Result Value Range   RBC / HPF 0-2  <3 RBC/hpf  LACTIC ACID, PLASMA     Status: None   Collection Time    12/22/12  7:45 AM      Result Value Range   Lactic Acid, Venous 2.2  0.5 - 2.2 mmol/L   Dg Chest 2 View  12/22/2012   CLINICAL  DATA:  Nausea, vomiting, epigastric pain and lower anterior chest pain. Weakness.  EXAM: CHEST  2 VIEW  COMPARISON:  Chest radiograph performed 03/28/2012  FINDINGS: The lungs are well-aerated. Mild left basilar opacity likely reflects atelectasis. There is no evidence of pleural effusion or pneumothorax.  The heart is normal in size; the mediastinal contour is within normal limits. No acute osseous abnormalities are seen.  IMPRESSION: Mild left basilar airspace opacity likely reflects  atelectasis; lungs otherwise clear.   Electronically Signed   By: Roanna Raider M.D.   On: 12/22/2012 06:29   US Abdomen Limited Ruq  12/22/2012   CLINICAL DATA:  Right upper quadrant abdominal pain.  EXAM: US ABDOMEN LIMITED - RIGHT UPPER QUADRANT  COMPARISON:  CT of the abdomen and pelvis, and abdominal ultrasound, performed 12/20/2012  FINDINGS: Gallbladder  The gallbladder is distended. A few small stones are seen, measuring up to 0.6 cm in size. Mild gallbladder wall thickening is suggested; no pericholecystic fluid is seen. No ultrasonographic Murphy's sign is elicited.  Common bile duct  Diameter: 1.0 cm, dilated for the patient's age. Not well characterized distally.  Liver:  No focal lesion identified. Within normal limits in parenchymal echogenicity.  IMPRESSION: Distention of the gallbladder, with a few small stones noted in the gallbladder. Mild gallbladder wall thickening noted. Common hepatic duct dilated to 1.0 cm, new from the prior study. This raises concern for distal obstruction.   Electronically Signed   By: Roanna Raider M.D.   On: 12/22/2012 06:14       CLINICAL DATA: Epigastric abdominal pain. Elevated LFTs. Elevated  lipase.  EXAM:  CT ABDOMEN AND PELVIS WITH CONTRAST  TECHNIQUE:  Multidetector CT imaging of the abdomen and pelvis was performed  using the standard protocol following bolus administration of  intravenous contrast.  CONTRAST: OMNIPAQUE IOHEXOL 300 MG/ML SOLN  COMPARISON: Abdominal ultrasound performed earlier today at 12:36  a.m., and CT of the abdomen and pelvis performed 03/28/2012  FINDINGS:  Mild bibasilar airspace opacities may reflect atelectasis or  possibly mild pneumonia.  A 4 mm hypodensity at the hepatic dome is nonspecific but may  reflect a tiny cyst. The spleen is unremarkable in appearance. There  is mild gallbladder wall thickening, without definite  pericholecystic fluid. This is nonspecific in appearance. A few  small stones  are seen at the base of the gallbladder, without  evidence of distal obstruction. The pancreas and adrenal glands are  unremarkable in appearance.  The kidneys are unremarkable in appearance. There is no evidence of  hydronephrosis. No renal or ureteral stones are seen. Mild  nonspecific perinephric stranding is noted bilaterally.  No free fluid is identified. The small bowel is unremarkable in  appearance. The stomach is within normal limits. No acute vascular  abnormalities are seen. Relatively diffuse calcification is noted  along the abdominal aorta and its branches.  The appendix is not definitely seen; there is no evidence for  appendicitis. Scattered diverticulosis is noted along the ascending  colon, without evidence of diverticulitis. The colon is otherwise  unremarkable in appearance.  The bladder is mildly distended and grossly unremarkable in  appearance. The prostate is markedly enlarged, measuring 6.3 cm in  transverse dimension, with somewhat heterogeneous enhancement. A  small left inguinal hernia is seen, containing only fat. No inguinal  lymphadenopathy is seen.  No acute osseous abnormalities are identified.  IMPRESSION:  1. Mild gallbladder wall thickening, without definite  pericholecystic fluid. This is nonspecific, but could reflect mild  cholecystitis. Few stones noted at the base of the gallbladder,  without evidence of distal obstruction.  2. Mild bibasilar airspace opacities may reflect atelectasis or  possibly mild pneumonia.  3. Relatively diffuse calcification along the abdominal aorta and  its branches.  4. Scattered diverticulosis along the ascending colon, without  evidence of diverticulitis.  5. Markedly enlarged prostate again noted, with heterogeneous  enhancement. This has increased in size from the prior study; would  evaluate further to exclude underlying malignancy.  6. Question of tiny hepatic cyst.  7. Small left inguinal hernia, containing  only fat.  Electronically Signed  By: Roanna Raider M.D.  On: 12/20/2012 04:39    Review of Systems  Constitutional: Positive for fever, chills, malaise/fatigue and diaphoresis. Negative for weight loss.  HENT: Negative.   Eyes: Negative.   Respiratory: Negative.   Cardiovascular: Negative.   Gastrointestinal: Positive for heartburn, nausea and abdominal pain. Negative for vomiting, diarrhea, constipation and blood in stool.  Genitourinary: Negative.   Musculoskeletal: Negative.   Skin: Negative.   Neurological: Positive for weakness.  Psychiatric/Behavioral: Negative.    Blood pressure 79/40, pulse 96, temperature 101.9 F (38.8 C), temperature source Axillary, resp. rate 26, height 5\' 8"  (1.727 m), weight 74.844 kg (165 lb), SpO2 92.00%. Physical Exam  Constitutional: He is oriented to person, place, and time. He appears well-developed and well-nourished. He appears lethargic.  HENT:  Head: Normocephalic and atraumatic.  Eyes: Conjunctivae and EOM are normal. Pupils are equal, round, and reactive to light.  Neck: Normal range of motion. Neck supple.  Cardiovascular: Normal heart sounds and normal pulses.  Tachycardia present.   Respiratory: Effort normal and breath sounds normal. No respiratory distress.  GI: Soft. Normal appearance and bowel sounds are normal. There is no tenderness.  Patient has received Morphine for abdominal pain  Musculoskeletal: Normal range of motion.  Neurological: He is oriented to person, place, and time. He appears lethargic.  Skin: Skin is warm, dry and intact.  Psychiatric: He has a normal mood and affect.   Assessment/Plan: 1) Cholelithiasis with possible choledocholithiasis and cholangitis and possibly gallstone pancreatitis and hypotension and platelets of 103K. Dr. Vida Rigger [facility] is the ERCP doc on call today] plans are to recheck a Lipase at 12 pm today and proceed with an ERCP at 200 pm this afternoon if his Lipase has improved. I have had a  lengthy discussion with the patient's daughter about her father's fragile medical condition and the about all the complications that can occur under these circumstances. She seems to understand. 2) Colonic diverticulosis/history of colonic polyps.  3) AODM  4) BPH.  5) HTN/Hyperlipidemia.  6) Aortic atherosclerosis. 7) Small left inguinal hernia with fat. Tritia Endo 12/22/2012, 9:52 AM

## 2012-12-22 NOTE — Progress Notes (Deleted)
240 ml of Fentanyl 2552mcg/250ml wasted in sink. Cruzita Lederer RN witnessed waste

## 2012-12-22 NOTE — ED Notes (Signed)
US at bedside

## 2012-12-22 NOTE — Progress Notes (Signed)
CRITICAL VALUE ALERT  Critical value received: + blood cx: gram neg rods in all 4 bottles  Date of notification:  12/22/2012   Time of notification:  2131  Critical value read back:yes  Nurse who received alert:  Everette Rank, RN  MD notified (1st page):  Dr. Marin Shutter  Time of first page:  2133  MD notified (2nd page):  Time of second page:  Responding MD:  Dr. Marin Shutter  Time MD responded:  2133  MD aware.

## 2012-12-23 ENCOUNTER — Inpatient Hospital Stay (HOSPITAL_COMMUNITY): Payer: Medicare Other

## 2012-12-23 ENCOUNTER — Encounter (HOSPITAL_COMMUNITY): Payer: Self-pay

## 2012-12-23 ENCOUNTER — Encounter (HOSPITAL_COMMUNITY): Payer: Medicare Other | Admitting: Anesthesiology

## 2012-12-23 ENCOUNTER — Inpatient Hospital Stay (HOSPITAL_COMMUNITY): Payer: Medicare Other | Admitting: Anesthesiology

## 2012-12-23 ENCOUNTER — Encounter (HOSPITAL_COMMUNITY)
Admission: EM | Disposition: A | Discharge status: Home / Self Care | Payer: Self-pay | Source: Home / Self Care | Attending: Pulmonary Disease

## 2012-12-23 DIAGNOSIS — A4151 Sepsis due to Escherichia coli [E. coli]: Secondary | ICD-10-CM | POA: Diagnosis not present

## 2012-12-23 DIAGNOSIS — A498 Other bacterial infections of unspecified site: Secondary | ICD-10-CM

## 2012-12-23 DIAGNOSIS — K810 Acute cholecystitis: Secondary | ICD-10-CM | POA: Diagnosis not present

## 2012-12-23 DIAGNOSIS — R7881 Bacteremia: Secondary | ICD-10-CM

## 2012-12-23 DIAGNOSIS — A419 Sepsis, unspecified organism: Secondary | ICD-10-CM | POA: Diagnosis present

## 2012-12-23 HISTORY — PX: ERCP: SHX5425

## 2012-12-23 LAB — CBC
MCH: 31.4 pg (ref 26.0–34.0)
MCV: 90.5 fL (ref 78.0–100.0)
Platelets: 71 10*3/uL — ABNORMAL LOW (ref 150–400)
RBC: 4.01 MIL/uL — ABNORMAL LOW (ref 4.22–5.81)
RDW: 14.5 % (ref 11.5–15.5)
WBC: 7.2 10*3/uL (ref 4.0–10.5)

## 2012-12-23 LAB — GLUCOSE, CAPILLARY
Glucose-Capillary: 83 mg/dL (ref 70–99)
Glucose-Capillary: 85 mg/dL (ref 70–99)

## 2012-12-23 LAB — BASIC METABOLIC PANEL
BUN: 20 mg/dL (ref 6–23)
CO2: 22 mEq/L (ref 19–32)
Calcium: 7.6 mg/dL — ABNORMAL LOW (ref 8.4–10.5)
Chloride: 117 mEq/L — ABNORMAL HIGH (ref 96–112)
Chloride: 112 mEq/L (ref 96–112)
Creatinine, Ser: 0.9 mg/dL (ref 0.50–1.35)
GFR calc Af Amer: >90 mL/min (ref >90)
GFR calc Af Amer: >90 mL/min (ref >90)
Glucose, Bld: 105 mg/dL — ABNORMAL HIGH (ref 70–99)
Potassium: 3.1 mEq/L — ABNORMAL LOW (ref 3.5–5.1)
Potassium: 3.3 mEq/L — ABNORMAL LOW (ref 3.5–5.1)

## 2012-12-23 LAB — LIPASE, BLOOD: Lipase: 251 U/L — ABNORMAL HIGH (ref 11–59)

## 2012-12-23 LAB — URINE CULTURE
Colony Count: NO GROWTH
Culture: NO GROWTH

## 2012-12-23 LAB — MAGNESIUM: Magnesium: 1.8 mg/dL (ref 1.5–2.5)

## 2012-12-23 LAB — PHOSPHORUS: Phosphorus: 1.9 mg/dL — ABNORMAL LOW (ref 2.3–4.6)

## 2012-12-23 SURGERY — ERCP, WITH INTERVENTION IF INDICATED
Anesthesia: General

## 2012-12-23 MED ORDER — LACTATED RINGERS IV SOLN
INTRAVENOUS | Status: DC | PRN
  Administered 2012-12-23: 09:00:00 via INTRAVENOUS

## 2012-12-23 MED ORDER — LIDOCAINE HCL (CARDIAC) 20 MG/ML IV SOLN
INTRAVENOUS | Status: DC | PRN
  Administered 2012-12-23: 50 mg via INTRAVENOUS

## 2012-12-23 MED ORDER — PANTOPRAZOLE SODIUM 40 MG IV SOLR
40.0000 mg | Freq: Two times a day (BID) | INTRAVENOUS | Status: DC
  Administered 2012-12-23 – 2012-12-25 (×5): 40 mg via INTRAVENOUS
  Filled 2012-12-23 (×6): qty 40

## 2012-12-23 MED ORDER — ROCURONIUM BROMIDE 100 MG/10ML IV SOLN
INTRAVENOUS | Status: AC
  Filled 2012-12-23: qty 1

## 2012-12-23 MED ORDER — FENTANYL CITRATE 0.05 MG/ML IJ SOLN
INTRAMUSCULAR | Status: AC
  Filled 2012-12-23: qty 5

## 2012-12-23 MED ORDER — ONDANSETRON HCL 4 MG/2ML IJ SOLN
INTRAMUSCULAR | Status: DC | PRN
  Administered 2012-12-23: 4 mg via INTRAVENOUS

## 2012-12-23 MED ORDER — PROPOFOL 10 MG/ML IV BOLUS
INTRAVENOUS | Status: AC
  Filled 2012-12-23: qty 20

## 2012-12-23 MED ORDER — PROPOFOL 10 MG/ML IV BOLUS
INTRAVENOUS | Status: DC | PRN
  Administered 2012-12-23: 100 mg via INTRAVENOUS

## 2012-12-23 MED ORDER — SODIUM CHLORIDE 0.9 % IV SOLN: INTRAVENOUS | Status: DC

## 2012-12-23 MED ORDER — ROCURONIUM BROMIDE 100 MG/10ML IV SOLN
INTRAVENOUS | Status: DC | PRN
  Administered 2012-12-23: 25 mg via INTRAVENOUS

## 2012-12-23 MED ORDER — POTASSIUM PHOSPHATE DIBASIC 3 MMOLE/ML IV SOLN
30.0000 mmol | Freq: Once | INTRAVENOUS | Status: AC
  Administered 2012-12-23: 30 mmol via INTRAVENOUS
  Filled 2012-12-23: qty 10

## 2012-12-23 MED ORDER — NEOSTIGMINE METHYLSULFATE 1 MG/ML IJ SOLN
INTRAMUSCULAR | Status: AC
  Filled 2012-12-23: qty 10

## 2012-12-23 MED ORDER — BIOTENE DRY MOUTH MT LIQD
15.0000 mL | Freq: Two times a day (BID) | OROMUCOSAL | Status: DC
  Administered 2012-12-23 – 2012-12-25 (×5): 15 mL via OROMUCOSAL

## 2012-12-23 MED ORDER — PHENYLEPHRINE HCL 10 MG/ML IJ SOLN
INTRAMUSCULAR | Status: DC | PRN
  Administered 2012-12-23 (×3): 40 ug via INTRAVENOUS

## 2012-12-23 MED ORDER — SODIUM CHLORIDE 0.9 % IV SOLN
INTRAVENOUS | Status: DC | PRN
  Administered 2012-12-23: 10:00:00

## 2012-12-23 MED ORDER — GLYCOPYRROLATE 0.2 MG/ML IJ SOLN
INTRAMUSCULAR | Status: AC
  Filled 2012-12-23: qty 4

## 2012-12-23 MED ORDER — GLUCAGON HCL (RDNA) 1 MG IJ SOLR
INTRAMUSCULAR | Status: AC
  Filled 2012-12-23: qty 2

## 2012-12-23 MED ORDER — VITAMINS A & D EX OINT
TOPICAL_OINTMENT | CUTANEOUS | Status: AC
  Administered 2012-12-23: 5
  Filled 2012-12-23: qty 5

## 2012-12-23 MED ORDER — FENTANYL CITRATE 0.05 MG/ML IJ SOLN
INTRAMUSCULAR | Status: DC | PRN
  Administered 2012-12-23: 100 ug via INTRAVENOUS

## 2012-12-23 MED ORDER — NEOSTIGMINE METHYLSULFATE 1 MG/ML IJ SOLN
INTRAMUSCULAR | Status: DC | PRN
  Administered 2012-12-23: 4 mg via INTRAVENOUS

## 2012-12-23 MED ORDER — MIDAZOLAM HCL 2 MG/2ML IJ SOLN
INTRAMUSCULAR | Status: AC
  Filled 2012-12-23: qty 2

## 2012-12-23 MED ORDER — LIDOCAINE HCL (CARDIAC) 20 MG/ML IV SOLN
INTRAVENOUS | Status: AC
  Filled 2012-12-23: qty 5

## 2012-12-23 MED ORDER — ONDANSETRON HCL 4 MG/2ML IJ SOLN
INTRAMUSCULAR | Status: AC
  Filled 2012-12-23: qty 2

## 2012-12-23 MED ORDER — GLYCOPYRROLATE 0.2 MG/ML IJ SOLN
INTRAMUSCULAR | Status: DC | PRN
  Administered 2012-12-23: .7 mg via INTRAVENOUS

## 2012-12-23 NOTE — Transfer of Care (Signed)
Immediate Anesthesia Transfer of Care Note  Patient: Kenneth Little  Procedure(s) Performed: Procedure(s): ENDOSCOPIC RETROGRADE CHOLANGIOPANCREATOGRAPHY (ERCP) (N/A)  Patient Location: ICU  Anesthesia Type:General  Level of Consciousness: awake, alert  and oriented  Airway & Oxygen Therapy: Patient Spontanous Breathing and Patient connected to face mask oxygen  Post-op Assessment: Report given to PACU RN, Post -op Vital signs reviewed and stable and Patient moving all extremities  Post vital signs: Reviewed and stable  Complications: No apparent anesthesia complications

## 2012-12-23 NOTE — Progress Notes (Signed)
PULMONARY  / CRITICAL CARE MEDICINE  Name: Kenneth Little MRN: [id number] DOB: 1945/02/20    ADMISSION DATE:  12/22/2012 CONSULTATION DATE:  12/22/2012  REFERRING MD : Patria Mane, EDP PRIMARY SERVICE: PCCM  CHIEF COMPLAINT:  Abdomen  BRIEF PATIENT DESCRIPTION: 67 y/o male was admitted from the [facility] ED on 12/13 with septic shock from cholangitis and gallstone pancreatitis.  SIGNIFICANT EVENTS / STUDIES:  12/13 RUQ ultrasound > gallbladder distension with wall thickening and small stones, common hepatic duct dilated to 1.0 cm 12/14 ERCP > sphincterotomy, ?one small stone removed, negative occlusion cholangiogram x2  LINES / TUBES: 12/13 R IJ CVL >  CULTURES: 12/13 blood culture >> e.coli  ANTIBIOTICS: 12/13 zosyn >>  SUBJECTIVE:  Feels really well this morning  VITAL SIGNS: Temp:  [97.7 F (36.5 C)-98.4 F (36.9 C)] 98.1 F (36.7 C) (12/14 1010) Pulse Rate:  [46-86] 63 (12/14 1100) Resp:  [11-20] 18 (12/14 1100) BP: (80-130)/(43-83) 92/50 mmHg (12/14 1100) SpO2:  [93 %-100 %] 99 % (12/14 1100) Weight:  [81 kg (178 lb 9.2 oz)] 81 kg (178 lb 9.2 oz) (12/14 0400) HEMODYNAMICS: CVP:  [7 mmHg-15 mmHg] 7 mmHg VENTILATOR SETTINGS:   INTAKE / OUTPUT: Intake/Output     12/13 0701 - 12/14 0700 12/14 0701 - 12/15 0700   P.O. 60    I.V. (mL/kg) 3466 (42.8) 1060 (13.1)   IV Piggyback 2612.5 12.5   Total Intake(mL/kg) 6138.5 (75.8) 1072.5 (13.2)   Urine (mL/kg/hr) 2600 (1.3) 550 (1.6)   Total Output 2600 550   Net +3538.5 +522.5        Stool Occurrence 2 x      PHYSICAL EXAMINATION:  Gen: no acute distress HEENT: NCAT, EOMi, OP clear, PULM: CTA B CV: RRR, no mgr, no JVD AB: BS+, soft, no tenderness on exam RUQ or epigastric area Ext: warm, no edema, no clubbing, no cyanosis Derm: no rash or skin breakdown Neuro: A&Ox4, CN II-XII intact, MAEW   LABS:  CBC  Recent Labs Lab 12/20/12 0117 12/22/12 0525 12/23/12 0525  WBC 3.6* 3.7* 7.2  HGB 15.0 15.0 12.6*  HCT  43.8 42.6 36.3*  PLT 138* 103* 71*   Coag's  Recent Labs Lab 12/22/12 1045  APTT 31  INR 1.02   BMET  Recent Labs Lab 12/22/12 0525 12/22/12 1215 12/22/12 1340 12/23/12 0525  NA 137 139  --  145  K 2.7* 2.6* 2.4* 3.1*  CL 95* 108  --  117*  CO2 28 20  --  22  BUN 27* 24*  --  23  CREATININE 1.35 1.28  --  0.90  GLUCOSE 238* 158*  --  101*   Electrolytes  Recent Labs Lab 12/22/12 0525 12/22/12 1215 12/23/12 0525  CALCIUM 9.6 7.5* 7.6*  MG  --   --  1.8  PHOS  --   --  1.9*   Sepsis Markers  Recent Labs Lab 12/22/12 0745 12/22/12 1045  LATICACIDVEN 2.2 1.3   ABG No results found for this basename: PHART, PCO2ART, PO2ART,  in the last 168 hours Liver Enzymes  Recent Labs Lab 12/20/12 0117 12/22/12 0525 12/22/12 1215  AST 678* 192* 131*  ALT 379* 465* 301*  ALKPHOS 286* 249* 169*  BILITOT 2.5* 3.9* 3.4*  ALBUMIN 3.4* 3.4* 2.3*   Cardiac Enzymes  Recent Labs Lab 12/20/12 0117 12/22/12 1045  TROPONINI <0.30 <0.30   Glucose  Recent Labs Lab 12/22/12 1645 12/22/12 2044 12/22/12 2339 12/23/12 0355 12/23/12 0422 12/23/12 0753  GLUCAP 169*  87 107* 62* 89 83      CXR: low lung volumes, bibasilar atelectasis  ASSESSMENT / PLAN:  PULMONARY A: No acute issues, some atelectasis likely due to splinting from pain P:   -O2 as needed  CARDIOVASCULAR A: Septic shock > resolved P:  -cvp monitoring -tele  RENAL A:  Normal renal function but Cr up from 2 days ago, likely due to poor po intake and sepsis P:   -aggressive IVF  GASTROINTESTINAL A:   Gallstone pancreatitis and cholangitis (fever, elevated bili, dilated CBD) > s/p sphincterotomy and ?stone removal?  P:   -plan lap chole on 12/15 -NPO -morphine for pain -aggressive IVF  HEMATOLOGIC A:  Mild thrombocytopenia P:  -monitor for bleeding  INFECTIOUS A:  Cholangitis, e.coli bacteremia P:  -agree with zosyn per pharmacy -f/u sensitivities  ENDOCRINE A:  DM2 P:    -ICU hyperglycemia protocol  NEUROLOGIC A:  No acute issues P:   -morphine for pain  TODAY'S SUMMARYYolonda Kida PCCM Pager: [phone] Cell: [phone] If no response, call [phone]   12/23/2012, 11:23 AM

## 2012-12-23 NOTE — Progress Notes (Signed)
1 Day Post-Op  Subjective: Feeling much better.  Denies abdominal pain.  Objective: Vital signs in last 24 hours: Temp:  [97.7 F (36.5 C)-101.9 F (38.8 C)] 98.1 F (36.7 C) (12/14 0400) Pulse Rate:  [46-106] 70 (12/14 0800) Resp:  [11-26] 17 (12/14 0800) BP: (74-130)/(36-83) 111/62 mmHg (12/14 0800) SpO2:  [92 %-100 %] 100 % (12/14 0800) Weight:  [178 lb 9.2 oz (81 kg)] 178 lb 9.2 oz (81 kg) (12/14 0400) Last BM Date: 12/23/12  Intake/Output from previous day: 12/13 0701 - 12/14 0700 In: 6138.5 [P.O.:60; I.V.:3466; IV Piggyback:2612.5] Out: 2600 [Urine:2600] Intake/Output this shift: Total I/O In: 150 [I.V.:150] Out: 300 [Urine:300]  PE: General- In NAD.  Scleral icterus. Abdomen-soft, nontender, no masses  Lab Results:   Recent Labs  12/22/12 0525 12/23/12 0525  WBC 3.7* 7.2  HGB 15.0 12.6*  HCT 42.6 36.3*  PLT 103* 71*   BMET  Recent Labs  12/22/12 1215 12/22/12 1340 12/23/12 0525  NA 139  --  145  K 2.6* 2.4* 3.1*  CL 108  --  117*  CO2 20  --  22  GLUCOSE 158*  --  101*  BUN 24*  --  23  CREATININE 1.28  --  0.90  CALCIUM 7.5*  --  7.6*   PT/INR  Recent Labs  12/22/12 1045  LABPROT 13.2  INR 1.02   Comprehensive Metabolic Panel:    Component Value Date/Time   NA 145 12/23/2012 0525   K 3.1* 12/23/2012 0525   CL 117* 12/23/2012 0525   CO2 22 12/23/2012 0525   BUN 23 12/23/2012 0525   CREATININE 0.90 12/23/2012 0525   GLUCOSE 101* 12/23/2012 0525   CALCIUM 7.6* 12/23/2012 0525   AST 131* 12/22/2012 1215   ALT 301* 12/22/2012 1215   ALKPHOS 169* 12/22/2012 1215   BILITOT 3.4* 12/22/2012 1215   PROT 4.9* 12/22/2012 1215   ALBUMIN 2.3* 12/22/2012 1215     Studies/Results: Dg Chest 2 View  12/22/2012   CLINICAL DATA:  Nausea, vomiting, epigastric pain and lower anterior chest pain. Weakness.  EXAM: CHEST  2 VIEW  COMPARISON:  Chest radiograph performed 03/28/2012  FINDINGS: The lungs are well-aerated. Mild left basilar opacity  likely reflects atelectasis. There is no evidence of pleural effusion or pneumothorax.  The heart is normal in size; the mediastinal contour is within normal limits. No acute osseous abnormalities are seen.  IMPRESSION: Mild left basilar airspace opacity likely reflects atelectasis; lungs otherwise clear.   Electronically Signed   By: Roanna Raider M.D.   On: 12/22/2012 06:29   Ct Abdomen W Contrast  12/22/2012   CLINICAL DATA:  Cholelithiasis with minimal suspected gallstone pancreatitis, possible choledocholithiasis or cholangitis  EXAM: CT ABDOMEN WITH CONTRAST  TECHNIQUE: Multidetector CT imaging of the abdomen was performed using the standard protocol following bolus administration of intravenous contrast.  CONTRAST:  OMNIPAQUE IOHEXOL 300 MG/ML  SOLN  COMPARISON:  12/20/2012  FINDINGS: Trace bilateral pleural effusions. Associated lower lobe opacities, likely atelectasis.  Liver is notable for altered perfusion/hyperemia along the gallbladder fossa (Series 2/image 29). Additional perfusional changes involving the medial segment left hepatic lobe (series 2/ image 23) and posterior right hepatic dome (series 2/ image 17).  Spleen and adrenal glands are within normal limits.  Mild peripancreatic inflammatory changes along the body/tail (series 2/ image 33), compatible with reported history of acute pancreatitis. No evidence of pancreatic necrosis or hemorrhage. No peripancreatic fluid collection/abscess.  Distended gallbladder with mild pericholecystic fluid (series  2/image 28) and early gallbladder wall thickening (series 2/image 31). Layering small gallstones in the gallbladder neck (series 2/images 34 and 35). Mild periportal edema. No intrahepatic or extrahepatic ductal dilatation. Common duct measures 7 mm, at the upper limits of normal, and is notable for a 3 mm distal CBD stone at the ampulla (series 2/image 39).  Kidneys are within normal limits.  No hydronephrosis.  Visualized bowel is  unremarkable.  Atherosclerotic calcifications of the abdominal aorta and branch vessels.  No abdominal ascites.  No suspicious abdominal lymphadenopathy.  Degenerative changes of the visualized thoracolumbar spine.  IMPRESSION: Acute pancreatitis. No evidence of pancreatic necrosis or hemorrhage. No peripancreatic fluid collection/abscess.  Cholelithiasis. Early/developing acute cholecystitis is possible, new from prior ultrasound. Consider hepatobiliary nuclear medicine scan for further evaluation as clinically warranted.  Choledocholithiasis with a 3 mm distal CBD stone just above the ampulla. Common duct measures 7 mm, at the upper limits of normal.  These results were called by telephone at the time of interpretation on 12/22/2012 at 6:35 PM to Dr. Charna Elizabeth , who verbally acknowledged these results.   Electronically Signed   By: Charline Bills M.D.   On: 12/22/2012 18:45   Dg Chest Port 1 View  12/22/2012   CLINICAL DATA:  Central line placement  EXAM: PORTABLE CHEST - 1 VIEW  COMPARISON:  12/22/2012, 602 hrs  FINDINGS: Cardiac shadow is stable. A new right-sided jugular central line is noted with the tip in the mid superior vena cava. No pneumothorax is noted. Mild basilar atelectatic changes are noted  IMPRESSION: No evidence of pneumothorax following central line placement.   Electronically Signed   By: Alcide Clever M.D.   On: 12/22/2012 10:37   US Abdomen Limited Ruq  12/22/2012   CLINICAL DATA:  Right upper quadrant abdominal pain.  EXAM: US ABDOMEN LIMITED - RIGHT UPPER QUADRANT  COMPARISON:  CT of the abdomen and pelvis, and abdominal ultrasound, performed 12/20/2012  FINDINGS: Gallbladder  The gallbladder is distended. A few small stones are seen, measuring up to 0.6 cm in size. Mild gallbladder wall thickening is suggested; no pericholecystic fluid is seen. No ultrasonographic Murphy's sign is elicited.  Common bile duct  Diameter: 1.0 cm, dilated for the patient's age. Not well  characterized distally.  Liver:  No focal lesion identified. Within normal limits in parenchymal echogenicity.  IMPRESSION: Distention of the gallbladder, with a few small stones noted in the gallbladder. Mild gallbladder wall thickening noted. Common hepatic duct dilated to 1.0 cm, new from the prior study. This raises concern for distal obstruction.   Electronically Signed   By: Roanna Raider M.D.   On: 12/22/2012 06:14    Anti-infectives: Anti-infectives   Start     Dose/Rate Route Frequency Ordered Stop   12/22/12 1400  piperacillin-tazobactam (ZOSYN) IVPB 3.375 g     3.375 g 12.5 mL/hr over 240 Minutes Intravenous 3 times per day 12/22/12 1014     12/22/12 0615  piperacillin-tazobactam (ZOSYN) IVPB 3.375 g     3.375 g 100 mL/hr over 30 Minutes Intravenous  Once 12/22/12 0601 12/22/12 0723      Assessment Active Problems:   Gallstone pancreatitis-clinically and chemically improved.   Acute cholangitis    LOS: 1 day   Plan: ERCP today.  If he has a fairly rapid recovery, would suggest cholecystectomy this admission.  Discussed with him and his family.   Shanvi Moyd J 12/23/2012

## 2012-12-23 NOTE — Progress Notes (Signed)
Patient back from OR. Pt in no signs of distress. VSS. Elink and Telemetry services contacted. Will continue to monitor.

## 2012-12-23 NOTE — Progress Notes (Signed)
Patient to OR. VSS. Pt in no distress. Elink and Telemetry sevrices contacted

## 2012-12-23 NOTE — Progress Notes (Signed)
eLink Physician-Brief Progress Note Patient Name: Kenneth Little DOB: April 09, 1945 MRN: [id number]  Date of Service  12/23/2012   HPI/Events of Note  Hypokalemia and hypophosphatemia  eICU Interventions  Potassium and Phos replaced   Intervention Category Intermediate Interventions: Electrolyte abnormality - evaluation and management  DETERDING,ELIZABETH 12/23/2012, 6:38 AM

## 2012-12-23 NOTE — Op Note (Signed)
[facility] [address] [place], [address]   ERCP PROCEDURE REPORT  PATIENT: Kenneth, Little.  MR# :[id number] BIRTHDATE: 11-18-1945  GENDER: Male ENDOSCOPIST: Vida Rigger, MD REFERRED BY: Charna Elizabeth, M.D. PROCEDURE DATE:  12/23/2012 PROCEDURE:   ERCP with sphincterotomy/papillotomy and ERCP with removal of calculus/calculi ASA CLASS:    2 INDICATIONS: probable CBD stone MEDICATIONS:   Gen. anesthesia TOPICAL ANESTHETIC:  none  DESCRIPTION OF PROCEDURE:   After the risks benefits and alternatives of the procedure were thoroughly explained, informed consent was obtained.  The pentax [id number]  endoscope was introduced through the mouth and advanced to the second portion of the duodenum .there were a few shallow duodenal bulb ulceration but no other endoscopic findings and a normal appearing but small ampulla was brought into view and on the first attempt deep selective cannulation was obtained and there was no pancreatic duct wire advancement or injections throughout the procedure and a JAG Jagwire was inserted into the intrahepatic and dye was injected and no obvious stone was seen on initial cholangiogram and we proceeded with a small to medium-sized sphincterotomy in the customary until we had adequate biliary drainage with yellow bile and no signs of cholangitis and could get the fully bowed sphincterotome easily in and out of the duct and then we proceeded with multiple balloon pull-through was using the 12-15 mm adjustable balloon inflated to 12 mm which passed readily through the patent sphincterotomy site without resistance and on one pull-through we thought we saw a small stone was delivered but could not find it endoscopically at the end of the procedure we then proceeded with 2 occlusion cholangiograms which did not reveal any residual findings and the scope was removed after the wire and balloon were removed and air was suctioned and the patient  tolerated the procedure well there was no obvious immediate complication         COMPLICATIONS: none  ENDOSCOPIC IMPRESSION:1. Few bulb ulcers 2 small ampulla status post small to medium-sized sphincterotomy 3. No pancreatic duct injections or wire advancements 4. Questionable 1 small stone delivered with adequate biliary drainage at the end of the procedure and multiple negative balloon pull-throughs and negative occlusion cholangiogram x2 5 otherwise no other findings RECOMMENDATIONS:customary post-ERCP orders and I have discussed the case with both Dr. Loreta Ave and surgical team and hopefully if no delayed complications or additional problems laparoscopic cholecystectomy tomorrow or Tuesdayand happy see back when necessary and care with aspirin and nonsteroidals in the future and will check H. pylori serology and continue pump inhibitors     _______________________________ eSigned:  Vida Rigger, MD 12/23/2012 10:26 AM   XL:[id number] Loreta Ave

## 2012-12-23 NOTE — Progress Notes (Signed)
Kenneth Little 9:07 AM  Subjective: Patient's case was discussed multiple times with Dr. Loreta Ave and his computer chart was reviewed yesterday and today and he is feeling much better and his labs are improved and his CT was reviewed  Objective: Vital signs stable afebrile no acute distress exam please see pre-assessment evaluation  Assessment: Gallstones and CBD stones and resolving gallstone pancreatitis  Plan: The risks benefits methods of ERCP was rediscussed with the patient and will proceed this morning with further workup and plans pending those findings  [facility] E

## 2012-12-23 NOTE — Anesthesia Preprocedure Evaluation (Addendum)
Anesthesia Evaluation  Patient identified by MRN, date of birth, ID band Patient awake    Reviewed: Allergy & Precautions, H&P , NPO status , Patient's Chart, lab work & pertinent test results  Airway Mallampati: II TM Distance: >3 FB Neck ROM: Full    Dental  (+) Teeth Intact, Caps and Dental Advisory Given   Pulmonary former smoker,  breath sounds clear to auscultation  Pulmonary exam normal       Cardiovascular hypertension, Rhythm:Regular Rate:Normal  Septic shock on admission   Neuro/Psych negative neurological ROS  negative psych ROS   GI/Hepatic negative GI ROS, Elevated LFTs, cholangitis and gallstone pancreatitis   Endo/Other  diabetes, Type 1, Insulin Dependent  Renal/GU negative Renal ROS  negative genitourinary   Musculoskeletal negative musculoskeletal ROS (+)   Abdominal   Peds  Hematology negative hematology ROS (+)   Anesthesia Other Findings   Reproductive/Obstetrics                       Anesthesia Physical Anesthesia Plan  ASA: III and emergent  Anesthesia Plan: General   Post-op Pain Management:    Induction: Intravenous  Airway Management Planned: Oral ETT  Additional Equipment:   Intra-op Plan:   Post-operative Plan: Extubation in OR  Informed Consent: I have reviewed the patients History and Physical, chart, labs and discussed the procedure including the risks, benefits and alternatives for the proposed anesthesia with the patient or authorized representative who has indicated his/her understanding and acceptance.   Dental advisory given  Plan Discussed with: CRNA  Anesthesia Plan Comments:        Anesthesia Quick Evaluation

## 2012-12-23 NOTE — Progress Notes (Signed)
Hypoglycemic Event  CBG: 62  Treatment: 15 GM carbohydrate snack  Symptoms: None  Follow-up CBG: [id number] CBG Result:89  Possible Reasons for Event: Inadequate meal intake  Comments/MD notified:    Chapman Fitch  Remember to initiate Hypoglycemia Order Set & complete

## 2012-12-23 NOTE — Anesthesia Postprocedure Evaluation (Signed)
Anesthesia Post Note  Patient: Kenneth Little  Procedure(s) Performed: Procedure(s) (LRB): ENDOSCOPIC RETROGRADE CHOLANGIOPANCREATOGRAPHY (ERCP) (N/A)  Anesthesia type: General  Patient location: PACU  Post pain: Pain level controlled  Post assessment: Post-op Vital signs reviewed  Last Vitals:  Filed Vitals:   12/23/12 1700  BP: 104/56  Pulse: 71  Temp:   Resp: 26    Post vital signs: Reviewed  Level of consciousness: sedated  Complications: No apparent anesthesia complications

## 2012-12-24 ENCOUNTER — Inpatient Hospital Stay (HOSPITAL_COMMUNITY): Payer: Medicare Other

## 2012-12-24 ENCOUNTER — Inpatient Hospital Stay (HOSPITAL_COMMUNITY): Payer: Medicare Other | Admitting: Anesthesiology

## 2012-12-24 ENCOUNTER — Encounter (HOSPITAL_COMMUNITY): Payer: Medicare Other | Admitting: Anesthesiology

## 2012-12-24 ENCOUNTER — Encounter (HOSPITAL_COMMUNITY)
Admission: EM | Disposition: A | Discharge status: Home / Self Care | Payer: Self-pay | Source: Home / Self Care | Attending: Pulmonary Disease

## 2012-12-24 DIAGNOSIS — K8066 Calculus of gallbladder and bile duct with acute and chronic cholecystitis without obstruction: Secondary | ICD-10-CM

## 2012-12-24 HISTORY — PX: CHOLECYSTECTOMY: SHX55

## 2012-12-24 LAB — GLUCOSE, CAPILLARY
Glucose-Capillary: 103 mg/dL — ABNORMAL HIGH (ref 70–99)
Glucose-Capillary: 73 mg/dL (ref 70–99)
Glucose-Capillary: 74 mg/dL (ref 70–99)
Glucose-Capillary: 93 mg/dL (ref 70–99)

## 2012-12-24 LAB — CBC WITH DIFFERENTIAL/PLATELET
HCT: 36.4 % — ABNORMAL LOW (ref 39.0–52.0)
Hemoglobin: 12.7 g/dL — ABNORMAL LOW (ref 13.0–17.0)
Lymphocytes Relative: 14 % (ref 12–46)
Lymphs Abs: 1.3 10*3/uL (ref 0.7–4.0)
MCH: 31.1 pg (ref 26.0–34.0)
MCV: 89 fL (ref 78.0–100.0)
Monocytes Absolute: 0.7 10*3/uL (ref 0.1–1.0)
Neutrophils Relative %: 74 % (ref 43–77)
Platelets: 94 10*3/uL — ABNORMAL LOW (ref 150–400)
RBC: 4.09 MIL/uL — ABNORMAL LOW (ref 4.22–5.81)
WBC: 8.9 10*3/uL (ref 4.0–10.5)

## 2012-12-24 LAB — COMPREHENSIVE METABOLIC PANEL
ALT: 213 U/L — ABNORMAL HIGH (ref 0–53)
Albumin: 2 g/dL — ABNORMAL LOW (ref 3.5–5.2)
Alkaline Phosphatase: 137 U/L — ABNORMAL HIGH (ref 39–117)
CO2: 20 mEq/L (ref 19–32)
GFR calc Af Amer: >90 mL/min (ref >90)
GFR calc non Af Amer: 89 mL/min — ABNORMAL LOW (ref >90)
Glucose, Bld: 75 mg/dL (ref 70–99)
Potassium: 2.8 mEq/L — ABNORMAL LOW (ref 3.5–5.1)
Sodium: 138 mEq/L (ref 135–145)

## 2012-12-24 LAB — CULTURE, BLOOD (ROUTINE X 2)

## 2012-12-24 LAB — BASIC METABOLIC PANEL
Calcium: 7.2 mg/dL — ABNORMAL LOW (ref 8.4–10.5)
Creatinine, Ser: 0.85 mg/dL (ref 0.50–1.35)
GFR calc non Af Amer: 89 mL/min — ABNORMAL LOW (ref >90)
Glucose, Bld: 79 mg/dL (ref 70–99)
Potassium: 3.3 mEq/L — ABNORMAL LOW (ref 3.5–5.1)
Sodium: 137 mEq/L (ref 135–145)

## 2012-12-24 SURGERY — LAPAROSCOPIC CHOLECYSTECTOMY WITH INTRAOPERATIVE CHOLANGIOGRAM
Anesthesia: General | Site: Abdomen

## 2012-12-24 MED ORDER — ONDANSETRON HCL 4 MG/2ML IJ SOLN
INTRAMUSCULAR | Status: DC | PRN
  Administered 2012-12-24: 4 mg via INTRAVENOUS

## 2012-12-24 MED ORDER — DEXTROSE-NACL 5-0.45 % IV SOLN
75.0000 mL/h | Freq: Once | INTRAVENOUS | Status: AC
  Administered 2012-12-24: 75 mL/h via INTRAVENOUS

## 2012-12-24 MED ORDER — LACTATED RINGERS IR SOLN
Status: DC | PRN
  Administered 2012-12-24: 1

## 2012-12-24 MED ORDER — DEXAMETHASONE SODIUM PHOSPHATE 10 MG/ML IJ SOLN
INTRAMUSCULAR | Status: AC
  Filled 2012-12-24: qty 1

## 2012-12-24 MED ORDER — SUCCINYLCHOLINE CHLORIDE 20 MG/ML IJ SOLN
INTRAMUSCULAR | Status: AC
  Filled 2012-12-24: qty 1

## 2012-12-24 MED ORDER — BUPIVACAINE-EPINEPHRINE 0.25% -1:200000 IJ SOLN
INTRAMUSCULAR | Status: DC | PRN
  Administered 2012-12-24: 30 mL

## 2012-12-24 MED ORDER — NEOSTIGMINE METHYLSULFATE 1 MG/ML IJ SOLN
INTRAMUSCULAR | Status: DC | PRN
  Administered 2012-12-24: 4 mg via INTRAVENOUS

## 2012-12-24 MED ORDER — EPHEDRINE SULFATE 50 MG/ML IJ SOLN
INTRAMUSCULAR | Status: DC | PRN
  Administered 2012-12-24 (×2): 5 mg via INTRAVENOUS

## 2012-12-24 MED ORDER — LACTATED RINGERS IV SOLN
INTRAVENOUS | Status: DC | PRN
  Administered 2012-12-24: 16:00:00 via INTRAVENOUS

## 2012-12-24 MED ORDER — HEPARIN SODIUM (PORCINE) 5000 UNIT/ML IJ SOLN
5000.0000 [IU] | Freq: Three times a day (TID) | INTRAMUSCULAR | Status: DC
  Administered 2012-12-25: 5000 [IU] via SUBCUTANEOUS
  Filled 2012-12-24 (×4): qty 1

## 2012-12-24 MED ORDER — LIDOCAINE HCL (CARDIAC) 20 MG/ML IV SOLN
INTRAVENOUS | Status: DC | PRN
  Administered 2012-12-24: 100 mg via INTRAVENOUS

## 2012-12-24 MED ORDER — HYDROCODONE-ACETAMINOPHEN 5-325 MG PO TABS: 1.0000 | ORAL_TABLET | ORAL | Status: DC | PRN

## 2012-12-24 MED ORDER — HYDROMORPHONE HCL PF 1 MG/ML IJ SOLN: 0.2500 mg | INTRAMUSCULAR | Status: DC | PRN

## 2012-12-24 MED ORDER — 0.9 % SODIUM CHLORIDE (POUR BTL) OPTIME
TOPICAL | Status: DC | PRN
  Administered 2012-12-24: 1000 mL

## 2012-12-24 MED ORDER — HYDROMORPHONE HCL PF 1 MG/ML IJ SOLN
INTRAMUSCULAR | Status: DC | PRN
  Administered 2012-12-24: 1 mg via INTRAVENOUS

## 2012-12-24 MED ORDER — LIDOCAINE HCL (CARDIAC) 20 MG/ML IV SOLN
INTRAVENOUS | Status: AC
  Filled 2012-12-24: qty 5

## 2012-12-24 MED ORDER — FENTANYL CITRATE 0.05 MG/ML IJ SOLN
INTRAMUSCULAR | Status: DC | PRN
  Administered 2012-12-24 (×2): 100 ug via INTRAVENOUS
  Administered 2012-12-24: 50 ug via INTRAVENOUS

## 2012-12-24 MED ORDER — SUCCINYLCHOLINE CHLORIDE 20 MG/ML IJ SOLN
INTRAMUSCULAR | Status: DC | PRN
  Administered 2012-12-24: 100 mg via INTRAVENOUS

## 2012-12-24 MED ORDER — BUPIVACAINE-EPINEPHRINE 0.25% -1:200000 IJ SOLN
INTRAMUSCULAR | Status: AC
  Filled 2012-12-24: qty 1

## 2012-12-24 MED ORDER — PROPOFOL 10 MG/ML IV BOLUS
INTRAVENOUS | Status: DC | PRN
  Administered 2012-12-24: 160 mg via INTRAVENOUS

## 2012-12-24 MED ORDER — HYDROMORPHONE HCL PF 2 MG/ML IJ SOLN
INTRAMUSCULAR | Status: AC
  Filled 2012-12-24: qty 1

## 2012-12-24 MED ORDER — ONDANSETRON HCL 4 MG/2ML IJ SOLN
INTRAMUSCULAR | Status: AC
  Filled 2012-12-24: qty 2

## 2012-12-24 MED ORDER — CISATRACURIUM BESYLATE 20 MG/10ML IV SOLN
INTRAVENOUS | Status: AC
  Filled 2012-12-24: qty 10

## 2012-12-24 MED ORDER — DEXTROSE-NACL 5-0.45 % IV SOLN: 75.0000 mL/h | Freq: Once | INTRAVENOUS | Status: DC

## 2012-12-24 MED ORDER — CISATRACURIUM BESYLATE (PF) 10 MG/5ML IV SOLN
INTRAVENOUS | Status: DC | PRN
  Administered 2012-12-24: 6 mg via INTRAVENOUS

## 2012-12-24 MED ORDER — IOHEXOL 300 MG/ML  SOLN
INTRAMUSCULAR | Status: DC | PRN
  Administered 2012-12-24: 10 mL via INTRAVENOUS

## 2012-12-24 MED ORDER — HEPARIN SODIUM (PORCINE) 5000 UNIT/ML IJ SOLN
5000.0000 [IU] | Freq: Three times a day (TID) | INTRAMUSCULAR | Status: DC
  Filled 2012-12-24 (×2): qty 1

## 2012-12-24 MED ORDER — GLYCOPYRROLATE 0.2 MG/ML IJ SOLN
INTRAMUSCULAR | Status: DC | PRN
  Administered 2012-12-24: .6 mg via INTRAVENOUS

## 2012-12-24 MED ORDER — POTASSIUM CHLORIDE 10 MEQ/50ML IV SOLN
10.0000 meq | INTRAVENOUS | Status: AC
  Administered 2012-12-24 (×8): 10 meq via INTRAVENOUS
  Filled 2012-12-24 (×8): qty 50

## 2012-12-24 MED ORDER — FENTANYL CITRATE 0.05 MG/ML IJ SOLN
INTRAMUSCULAR | Status: AC
  Filled 2012-12-24: qty 5

## 2012-12-24 MED ORDER — PROPOFOL 10 MG/ML IV BOLUS
INTRAVENOUS | Status: AC
  Filled 2012-12-24: qty 20

## 2012-12-24 MED ORDER — LACTATED RINGERS IV SOLN: INTRAVENOUS | Status: DC

## 2012-12-24 MED ORDER — KCL IN DEXTROSE-NACL 40-5-0.45 MEQ/L-%-% IV SOLN
INTRAVENOUS | Status: DC
  Administered 2012-12-24: 1000 mL via INTRAVENOUS
  Administered 2012-12-24: 75 mL via INTRAVENOUS
  Administered 2012-12-25: 09:00:00 via INTRAVENOUS
  Filled 2012-12-24 (×3): qty 1000

## 2012-12-24 SURGICAL SUPPLY — 36 items
APPLIER CLIP ROT 10 11.4 M/L (STAPLE) ×2
BENZOIN TINCTURE PRP APPL 2/3 (GAUZE/BANDAGES/DRESSINGS) ×2 IMPLANT
CANISTER SUCTION 2500CC (MISCELLANEOUS) ×2 IMPLANT
CHLORAPREP W/TINT 26ML (MISCELLANEOUS) ×2 IMPLANT
CHOLANGIOGRAM CATH TAUT (CATHETERS) ×2 IMPLANT
CLIP APPLIE ROT 10 11.4 M/L (STAPLE) ×1 IMPLANT
COVER MAYO STAND STRL (DRAPES) ×2 IMPLANT
DECANTER SPIKE VIAL GLASS SM (MISCELLANEOUS) ×2 IMPLANT
DERMABOND ADVANCED (GAUZE/BANDAGES/DRESSINGS) ×1
DERMABOND ADVANCED .7 DNX12 (GAUZE/BANDAGES/DRESSINGS) ×1 IMPLANT
DRAPE C-ARM 42X120 X-RAY (DRAPES) ×2 IMPLANT
DRAPE LAPAROSCOPIC ABDOMINAL (DRAPES) ×2 IMPLANT
DRAPE UTILITY XL STRL (DRAPES) ×2 IMPLANT
ELECT REM PT RETURN 9FT ADLT (ELECTROSURGICAL) ×2
ELECTRODE REM PT RTRN 9FT ADLT (ELECTROSURGICAL) ×1 IMPLANT
GLOVE SURG SIGNA 7.5 PF LTX (GLOVE) ×2 IMPLANT
GOWN STRL REIN XL XLG (GOWN DISPOSABLE) ×6 IMPLANT
HEMOSTAT SURGICEL 4X8 (HEMOSTASIS) ×2 IMPLANT
IV CATH 14GX2 1/4 (CATHETERS) ×2 IMPLANT
IV SET MACRO CATH EXT 6 LUER (IV SETS) ×2 IMPLANT
KIT BASIN OR (CUSTOM PROCEDURE TRAY) ×2 IMPLANT
NS IRRIG 1000ML POUR BTL (IV SOLUTION) ×2 IMPLANT
POUCH SPECIMEN RETRIEVAL 10MM (ENDOMECHANICALS) ×2 IMPLANT
SET IRRIG TUBING LAPAROSCOPIC (IRRIGATION / IRRIGATOR) ×2 IMPLANT
SLEEVE XCEL OPT CAN 5 100 (ENDOMECHANICALS) ×2 IMPLANT
SOLUTION ANTI FOG 6CC (MISCELLANEOUS) ×2 IMPLANT
STOPCOCK 4 WAY LG BORE MALE ST (IV SETS) ×2 IMPLANT
STRIP CLOSURE SKIN 1/4X4 (GAUZE/BANDAGES/DRESSINGS) ×2 IMPLANT
SUT VIC AB 5-0 PS2 18 (SUTURE) ×2 IMPLANT
TOWEL OR 17X26 10 PK STRL BLUE (TOWEL DISPOSABLE) ×2 IMPLANT
TOWEL OR NON WOVEN STRL DISP B (DISPOSABLE) ×2 IMPLANT
TRAY LAP CHOLE (CUSTOM PROCEDURE TRAY) ×2 IMPLANT
TROCAR BLADELESS OPT 5 100 (ENDOMECHANICALS) ×2 IMPLANT
TROCAR XCEL BLUNT TIP 100MML (ENDOMECHANICALS) ×2 IMPLANT
TROCAR XCEL NON-BLD 11X100MML (ENDOMECHANICALS) ×2 IMPLANT
TUBING INSUFFLATION 10FT LAP (TUBING) ×2 IMPLANT

## 2012-12-24 NOTE — Preoperative (Signed)
Beta Blockers   Reason not to administer Beta Blockers:Not Applicable 

## 2012-12-24 NOTE — Transfer of Care (Signed)
Immediate Anesthesia Transfer of Care Note  Patient: Kenneth Little  Procedure(s) Performed: Procedure(s): LAPAROSCOPIC CHOLECYSTECTOMY WITH INTRAOPERATIVE CHOLANGIOGRAM (N/A)  Patient Location: PACU  Anesthesia Type:General  Level of Consciousness: awake, alert  and oriented  Airway & Oxygen Therapy: Patient Spontanous Breathing and Patient connected to face mask oxygen  Post-op Assessment: Report given to PACU RN and Post -op Vital signs reviewed and stable  Post vital signs: Reviewed and stable  Complications: No apparent anesthesia complications

## 2012-12-24 NOTE — Progress Notes (Signed)
Patient ID: Kenneth Little, male   DOB: 05/15/1945, 67 y.o.   MRN: [id number] 1 Day Post-Op  Subjective: Pt feels well.  Really wants his gallbladder out today.  No abdominal pain  Objective: Vital signs in last 24 hours: Temp:  [98 F (36.7 C)-98.5 F (36.9 C)] 98.5 F (36.9 C) (12/15 0400) Pulse Rate:  [50-72] 56 (12/15 0700) Resp:  [10-26] 14 (12/15 0700) BP: (92-115)/(44-67) 106/53 mmHg (12/15 0700) SpO2:  [93 %-99 %] 94 % (12/15 0700) Weight:  [187 lb 9.8 oz (85.1 kg)] 187 lb 9.8 oz (85.1 kg) (12/15 0400) Last BM Date: 12/23/12  Intake/Output from previous day: 12/14 0701 - 12/15 0700 In: 4501.3 [P.O.:1660; I.V.:2603.8; IV Piggyback:237.5] Out: 2350 [Urine:2350] Intake/Output this shift:    PE: Abd: soft, NT, ND, +BS Heart: regular Lungs: CTAB  Lab Results:   Recent Labs  12/23/12 0525 12/24/12 0425  WBC 7.2 8.9  HGB 12.6* 12.7*  HCT 36.3* 36.4*  PLT 71* 94*   BMET  Recent Labs  12/23/12 1447 12/24/12 0425  NA 143 138  K 3.3* 2.8*  CL 112 106  CO2 20 20  GLUCOSE 105* 75  BUN 20 16  CREATININE 0.83 0.87  CALCIUM 7.3* 7.0*   PT/INR  Recent Labs  12/22/12 1045  LABPROT 13.2  INR 1.02   CMP     Component Value Date/Time   NA 138 12/24/2012 0425   K 2.8* 12/24/2012 0425   CL 106 12/24/2012 0425   CO2 20 12/24/2012 0425   GLUCOSE 75 12/24/2012 0425   BUN 16 12/24/2012 0425   CREATININE 0.87 12/24/2012 0425   CALCIUM 7.0* 12/24/2012 0425   PROT 4.8* 12/24/2012 0425   ALBUMIN 2.0* 12/24/2012 0425   AST 97* 12/24/2012 0425   ALT 213* 12/24/2012 0425   ALKPHOS 137* 12/24/2012 0425   BILITOT 1.2 12/24/2012 0425   GFRNONAA 89* 12/24/2012 0425   GFRAA >90 12/24/2012 0425   Lipase     Component Value Date/Time   LIPASE 35 12/24/2012 0425       Studies/Results: Ct Abdomen W Contrast  12/22/2012   CLINICAL DATA:  Cholelithiasis with minimal suspected gallstone pancreatitis, possible choledocholithiasis or cholangitis  EXAM: CT ABDOMEN  WITH CONTRAST  TECHNIQUE: Multidetector CT imaging of the abdomen was performed using the standard protocol following bolus administration of intravenous contrast.  CONTRAST:  OMNIPAQUE IOHEXOL 300 MG/ML  SOLN  COMPARISON:  12/20/2012  FINDINGS: Trace bilateral pleural effusions. Associated lower lobe opacities, likely atelectasis.  Liver is notable for altered perfusion/hyperemia along the gallbladder fossa (Series 2/image 29). Additional perfusional changes involving the medial segment left hepatic lobe (series 2/ image 23) and posterior right hepatic dome (series 2/ image 17).  Spleen and adrenal glands are within normal limits.  Mild peripancreatic inflammatory changes along the body/tail (series 2/ image 33), compatible with reported history of acute pancreatitis. No evidence of pancreatic necrosis or hemorrhage. No peripancreatic fluid collection/abscess.  Distended gallbladder with mild pericholecystic fluid (series 2/image 28) and early gallbladder wall thickening (series 2/image 31). Layering small gallstones in the gallbladder neck (series 2/images 34 and 35). Mild periportal edema. No intrahepatic or extrahepatic ductal dilatation. Common duct measures 7 mm, at the upper limits of normal, and is notable for a 3 mm distal CBD stone at the ampulla (series 2/image 39).  Kidneys are within normal limits.  No hydronephrosis.  Visualized bowel is unremarkable.  Atherosclerotic calcifications of the abdominal aorta and branch vessels.  No abdominal ascites.  No suspicious abdominal lymphadenopathy.  Degenerative changes of the visualized thoracolumbar spine.  IMPRESSION: Acute pancreatitis. No evidence of pancreatic necrosis or hemorrhage. No peripancreatic fluid collection/abscess.  Cholelithiasis. Early/developing acute cholecystitis is possible, new from prior ultrasound. Consider hepatobiliary nuclear medicine scan for further evaluation as clinically warranted.  Choledocholithiasis with a 3 mm distal  CBD stone just above the ampulla. Common duct measures 7 mm, at the upper limits of normal.  These results were called by telephone at the time of interpretation on 12/22/2012 at 6:35 PM to Dr. Charna Elizabeth , who verbally acknowledged these results.   Electronically Signed   By: Charline Bills M.D.   On: 12/22/2012 18:45   Dg Chest Port 1 View  12/22/2012   CLINICAL DATA:  Central line placement  EXAM: PORTABLE CHEST - 1 VIEW  COMPARISON:  12/22/2012, 602 hrs  FINDINGS: Cardiac shadow is stable. A new right-sided jugular central line is noted with the tip in the mid superior vena cava. No pneumothorax is noted. Mild basilar atelectatic changes are noted  IMPRESSION: No evidence of pneumothorax following central line placement.   Electronically Signed   By: Alcide Clever M.D.   On: 12/22/2012 10:37   Dg Ercp Biliary & Pancreatic Ducts  12/23/2012   CLINICAL DATA:  Choledocholithiasis  EXAM: ERCP  TECHNIQUE: Multiple spot images obtained with the fluoroscopic device and submitted for interpretation post-procedure.  COMPARISON:  CT from the previous day  FINDINGS: Multiple spot films were obtained during an ERCP. 2 min of 15 seconds of fluoroscopy was utilized. No definitive filling defects are identified. There is filling of the gallbladder noted as well.   Electronically Signed   By: Alcide Clever M.D.   On: 12/23/2012 10:03    Anti-infectives: Anti-infectives   Start     Dose/Rate Route Frequency Ordered Stop   12/22/12 1400  piperacillin-tazobactam (ZOSYN) IVPB 3.375 g     3.375 g 12.5 mL/hr over 240 Minutes Intravenous 3 times per day 12/22/12 1014     12/22/12 0615  piperacillin-tazobactam (ZOSYN) IVPB 3.375 g     3.375 g 100 mL/hr over 30 Minutes Intravenous  Once 12/22/12 0601 12/22/12 0723       Assessment/Plan  1. Gallstone pancreatitis 2. E. Coli Bacteremia 3. Thrombocytopenia 4. H/o HTN 5. DM 6. Hypokalemia  Plan: 1. Will keep NPO.  Dr. Ezzard Standing will evaluate the patient  between cases and decide about proceeding to the OR today. 2. Cont IV abx therapy 3. Will have to replete K+ or OR will not allow [place] to proceed. 4. Platelets are on the rise, suspect they declined due to sepsis.    LOS: 2 days    OSBORNE,KELLY E 12/24/2012, 8:39 AM Pager: [phone]  Agree with above. I discussed gall bladder surgery with him. I discussed with the patient the indications and risks of gall bladder surgery.  The primary risks of gall bladder surgery include, but are not limited to, bleeding, infection, common bile duct injury, and open surgery.  There is also the risk that the patient may have continued symptoms after surgery.  We discussed the typical post-operative recovery course. I tried to answer the patient's questions. Son in room, Rand. 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  Ovidio Kin, MD, [facility] Pager: [phone] Office phone:  [phone]

## 2012-12-24 NOTE — Progress Notes (Signed)
[facility] ADULT ICU REPLACEMENT PROTOCOL FOR AM LAB REPLACEMENT ONLY  The patient does apply for the [facility] Adult ICU Electrolyte Replacment Protocol based on the criteria listed below:   1. Is GFR >/= 40 ml/min? yes  Patient's GFR today is >90 2. Is urine output >/= 0.5 ml/kg/hr for the last 6 hours? yes Patient's UOP is 0.7 ml/kg/hr 3. Is BUN < 60 mg/dL? yes  Patient's BUN today is 16 4. Abnormal electrolyte(s): K+2.8 5. Ordered repletion with: protocol 6. If a panic level lab has been reported, has the CCM MD in charge been notified? yes.   Physician:  Wyline Mood Hilliard 12/24/2012 5:25 AM

## 2012-12-24 NOTE — Anesthesia Preprocedure Evaluation (Addendum)
Anesthesia Evaluation  Patient identified by MRN, date of birth, ID band Patient awake    Reviewed: Allergy & Precautions, H&P , NPO status , Patient's Chart, lab work & pertinent test results  Airway Mallampati: II TM Distance: >3 FB Neck ROM: full    Dental no notable dental hx. (+) Teeth Intact and Dental Advisory Given   Pulmonary neg pulmonary ROS, former smoker,  breath sounds clear to auscultation  Pulmonary exam normal       Cardiovascular Exercise Tolerance: Good hypertension, Pt. on medications negative cardio ROS  Rhythm:regular Rate:Normal  LAFB.  History septic shock   Neuro/Psych negative neurological ROS  negative psych ROS   GI/Hepatic negative GI ROS, Neg liver ROS, Increased LFTs Gallstone pancreatitis   Endo/Other  diabetes, Well Controlled, Type 2, Oral Hypoglycemic Agents and Insulin Dependent  Renal/GU negative Renal ROS  negative genitourinary   Musculoskeletal   Abdominal   Peds  Hematology negative hematology ROS (+) Thrombocytopenia 90K   Anesthesia Other Findings   Reproductive/Obstetrics negative OB ROS                          Anesthesia Physical Anesthesia Plan  ASA: III  Anesthesia Plan: General   Post-op Pain Management:    Induction: Intravenous  Airway Management Planned: Oral ETT  Additional Equipment:   Intra-op Plan:   Post-operative Plan: Extubation in OR  Informed Consent: I have reviewed the patients History and Physical, chart, labs and discussed the procedure including the risks, benefits and alternatives for the proposed anesthesia with the patient or authorized representative who has indicated his/her understanding and acceptance.   Dental Advisory Given  Plan Discussed with: CRNA and Surgeon  Anesthesia Plan Comments:         Anesthesia Quick Evaluation

## 2012-12-24 NOTE — Progress Notes (Signed)
PULMONARY  / CRITICAL CARE MEDICINE  Name: Kenneth Little MRN: [id number] DOB: [birth year]    ADMISSION DATE:  12/22/2012 CONSULTATION DATE:  12/22/2012  REFERRING MD : Patria Mane, EDP PRIMARY SERVICE: PCCM  CHIEF COMPLAINT:  Abdomen pain  BRIEF PATIENT DESCRIPTION: 67 y/o male was admitted from the [facility] ED on 12/13 with septic shock from cholangitis and gallstone pancreatitis.  SIGNIFICANT EVENTS / STUDIES:  12/13 RUQ ultrasound > gallbladder distension with wall thickening and small stones, common hepatic duct dilated to 1.0 cm 12/14 ERCP > sphincterotomy, ?one small stone removed, negative occlusion cholangiogram x2  LINES / TUBES: 12/13 R IJ CVL >  CULTURES: 12/13 blood culture >> e.coli (pan sensitive)  ANTIBIOTICS: 12/13 zosyn >>  SUBJECTIVE:  Feels well, ready for surgery   VITAL SIGNS: Temp:  [98 F (36.7 C)-98.5 F (36.9 C)] 98.5 F (36.9 C) (12/15 0400) Pulse Rate:  [50-72] 56 (12/15 0700) Resp:  [10-26] 14 (12/15 0700) BP: (92-115)/(44-67) 106/53 mmHg (12/15 0700) SpO2:  [93 %-99 %] 94 % (12/15 0700) Weight:  [85.1 kg (187 lb 9.8 oz)] 85.1 kg (187 lb 9.8 oz) (12/15 0400) HEMODYNAMICS:   VENTILATOR SETTINGS:   INTAKE / OUTPUT: Intake/Output     12/14 0701 - 12/15 0700 12/15 0701 - 12/16 0700   P.O. 1660    I.V. (mL/kg) 2603.8 (30.6)    IV Piggyback 237.5    Total Intake(mL/kg) 4501.3 (52.9)    Urine (mL/kg/hr) 2350 (1.2)    Total Output 2350     Net +2151.3            PHYSICAL EXAMINATION:  Gen: no acute distress HEENT: NCAT, EOMi, OP clear, PULM: CTA B CV: RRR, no mgr, no JVD AB: BS+, soft, no tenderness on exam RUQ or epigastric area Ext: warm, no edema, no clubbing, no cyanosis Derm: no rash or skin breakdown Neuro: A&Ox4, CN II-XII intact, MAEW   LABS:  CBC  Recent Labs Lab 12/22/12 0525 12/23/12 0525 12/24/12 0425  WBC 3.7* 7.2 8.9  HGB 15.0 12.6* 12.7*  HCT 42.6 36.3* 36.4*  PLT 103* 71* 94*   Coag's  Recent Labs Lab  12/22/12 1045  APTT 31  INR 1.02   BMET  Recent Labs Lab 12/23/12 0525 12/23/12 1447 12/24/12 0425  NA 145 143 138  K 3.1* 3.3* 2.8*  CL 117* 112 106  CO2 22 20 20   BUN 23 20 16   CREATININE 0.90 0.83 0.87  GLUCOSE 101* 105* 75   Electrolytes  Recent Labs Lab 12/23/12 0525 12/23/12 1447 12/24/12 0425  CALCIUM 7.6* 7.3* 7.0*  MG 1.8  --   --   PHOS 1.9*  --   --    Sepsis Markers  Recent Labs Lab 12/22/12 0745 12/22/12 1045  LATICACIDVEN 2.2 1.3   ABG No results found for this basename: PHART, PCO2ART, PO2ART,  in the last 168 hours Liver Enzymes  Recent Labs Lab 12/22/12 0525 12/22/12 1215 12/24/12 0425  AST 192* 131* 97*  ALT 465* 301* 213*  ALKPHOS 249* 169* 137*  BILITOT 3.9* 3.4* 1.2  ALBUMIN 3.4* 2.3* 2.0*   Cardiac Enzymes  Recent Labs Lab 12/20/12 0117 12/22/12 1045  TROPONINI <0.30 <0.30   Glucose  Recent Labs Lab 12/23/12 0355 12/23/12 0422 12/23/12 0753 12/23/12 1213 12/23/12 1612 12/23/12 1943  GLUCAP 62* 89 83 85 90 129*      12/13 CXR: low lung volumes, bibasilar atelectasis  ASSESSMENT / PLAN:  PULMONARY A: No acute issues, some atelectasis likely  due to splinting from pain P:   -O2 as needed  CARDIOVASCULAR A: Septic shock > resolved P:  -cvp monitoring -tele  RENAL A:  Normal renal function but Cr up from 2 days ago, likely due to poor po intake and sepsis Recurrent hypokalemia, not clear why remains low P:   -check renin-aldosterone ratio -would like to check 24 hour urine electrolytes, but will wait until after surgery -aggressively replete this morning in anticipation of surgery -repeat BMET 1200 today and again in AM  GASTROINTESTINAL A:   Gallstone pancreatitis and cholangitis (fever, elevated bili, dilated CBD) > s/p sphincterotomy  P:   -lap chole, hopefully today given multiple recurrent biliary episodes in last 5-6 days -NPO -morphine for pain -aggressive IVF  HEMATOLOGIC A:  Mild  thrombocytopenia improving P:  -monitor for bleeding  INFECTIOUS A:  Cholangitis, e.coli bacteremia (pan sensitive) P:  -repeat blood culture -will treat with zosyn through perioperative period, then will switch to amoxicillin oral post op and plan to treat through 12/27  ENDOCRINE A:  DM2 P:   -ICU hyperglycemia protocol  NEUROLOGIC A:  No acute issues P:   -morphine for pain  TODAY'S SUMMARY:  Doing well post sphincterotomy, plan lap chole today or tomorrow, treat hypokalemia, cont zosyn until post op   Yolonda Kida PCCM Pager: [phone] Cell: [phone] If no response, call [phone]   12/24/2012, 8:33 AM

## 2012-12-24 NOTE — Op Note (Signed)
12/22/2012 - 12/24/2012  5:28 PM  PATIENT:  Kenneth Little, 67 y.o., male, MRN: [id number]  PREOP DIAGNOSIS:  cholecystitis with cholelithiasis, history of pancreatitis  POSTOP DIAGNOSIS:   Cholelithiasis, cholecystitis, history of pancreatitis  PROCEDURE:   Procedure(s): LAPAROSCOPIC CHOLECYSTECTOMY WITH INTRAOPERATIVE CHOLANGIOGRAM  SURGEON:   Ovidio Kin, M.D.  ASSISTANT:   Sheron Nightingale, MD  ANESTHESIA:   general  Anesthesiologist: Gaetano Hawthorne, MD CRNA: Florene Route, CRNA  General  ASA: @asa @  EBL:  Minimal  ml  BLOOD ADMINISTERED: none  DRAINS: none   LOCAL MEDICATIONS USED:   30 cc 1/4% Marcaine  SPECIMEN:   Gal bladder  COUNTS CORRECT:  YES  INDICATIONS FOR PROCEDURE:  Kenneth Little is a 67 y.o. (DOB: [birth year]) Asian  male whose primary care physician is Rogelia Boga, MD and comes for cholecystectomy.  The patient was admitted 12/22/2012 with septic shock.  He grew out E. Coli from blood cultures on 12/22/2012.  He also had an elevated lipase (>3,000) on admission.  He underwent an ERCP/sphincterotomy on 12/23/2012 by Dr. Berton Lan.  He is now ready to proceed with a cholecystectomy.   The indications and risks of the gall bladder surgery were explained to the patient.  The risks include, but are not limited to, infection, bleeding, common bile duct injury and open surgery.  SURGERY:  The patient was taken to room #1 at [facility].  The abdomen was prepped with chloroprep.  The patient was already on Zosyn as an antibiotic.   A time out was held and the surgical checklist run.   An infraumbilical incision was made into the abdominal cavity.  A 12 mm Hasson trocar was inserted into the abdominal cavity through the infraumbilical incision and secured with a 0 Vicryl suture.  Three additional trocars were inserted: a 10 mm trocar in the sub-xiphoid location, a 5 mm trocar in the right mid subcostal area, and a 5 mm trocar in the right lateral subcostal  area.   The abdomen was explored and the liver, stomach, and bowel that could be seen were unremarkable.  He had a prior open appendectomy with a RLQ incision.  There were omental adhesions to the scar.  I left these adhesions alone.   The gall bladder was identified, grasped, and rotated cephalad.  Disssection was carried down to the gall bladder/cystic duct junction and the cystic duct isolated.  A clip was placed on the gall bladder side of the cystic duct.   An intra-operative cholangiogram was shot.   The intra-operative cholangiogram was shot using a cut off Taut catheter placed through a 14 gauge angiocath in the RUQ.  The Taut catheter was inserted in the cut cystic duct and secured with an endoclip.  A cholangiogram was shot with 10 cc of 1/2 strength Omnipaque.  Using fluoroscopy, the cholangiogram showed the flow of contrast into the common bile duct, up the hepatic radicals, and into the duodenum.  There was no mass or obstruction.  This was a normal intra-operative cholangiogram.  He did have a mildly prominent left hepatic duct.   The Taut catheter was removed.  The cystic duct was tripley endoclipped and the cystic artery was identified and clipped.  The gall bladder was bluntly and sharpley dissected from the gall bladder bed.   After the gall bladder was removed from the liver, the gall bladder bed and Triangle of [place] were inspected.  There was no bleeding or bile leak.  The gall bladder  was placed in a endocatch bag and delivered through the umbilicus.  The abdomen was irrigated with 800 cc saline.   The trocars were then removed.  I infiltrated 30cc of 1/4% Marcaine into the incisions.  The umbilical port closed with a 0 Vicryl suture and the skin closed with 5-0 vicryl.  The skin was painted with Dermabond.  The patient's sponge and needle count were correct.  The patient was transported to the RR in good condition.   He was in the ICU pre op and will return there because it is so  late in the evening.  Discharge will be based on how he does.  Ovidio Kin, MD, [facility] Surgery Pager: [phone] Office phone:  [phone]

## 2012-12-24 NOTE — Progress Notes (Signed)
  CARE MANAGEMENT NOTE 12/24/2012  Patient:  Kenneth Little, Kenneth Little   Account Number:  [id number]  Date Initiated:  12/24/2012  Documentation initiated by:  DAVIS,RHONDA  Subjective/Objective Assessment:   pt with cholelithasis had ercp now with sepsis and hypotensive, did require iv neo drip post op/     Action/Plan:   home when stable   Anticipated DC Date:  12/27/2012   Anticipated DC Plan:  HOME/SELF CARE  In-house referral  NA      DC Planning Services  NA      PAC Choice  NA   Choice offered to / List presented to:  NA   DME arranged  NA      DME agency  NA     HH arranged  NA      HH agency  NA   Status of service:  In process, will continue to follow Medicare Important Message given?  NA - LOS <3 / Initial given by admissions (If response is "NO", the following Medicare IM given date fields will be blank) Date Medicare IM given:   Date Additional Medicare IM given:    Discharge Disposition:    Per UR Regulation:  Reviewed for med. necessity/level of care/duration of stay  If discussed at Long Length of Stay Meetings, dates discussed:    Comments:  [id number]/Rhonda Stark Jock, BSN, [place] [phone] Chart Reviewed for discharge and hospital needs. Discharge needs at time of review:  None present will follow for needs. Review of patient progress due on [id number].

## 2012-12-24 NOTE — Anesthesia Procedure Notes (Signed)
Procedure Name: Intubation Date/Time: 12/24/2012 4:04 PM Performed by: Florene Route Pre-anesthesia Checklist: Patient identified Patient Re-evaluated:Patient Re-evaluated prior to inductionOxygen Delivery Method: Circle system utilized Preoxygenation: Pre-oxygenation with 100% oxygen Intubation Type: IV induction Ventilation: Mask ventilation without difficulty and Oral airway inserted - appropriate to patient size Laryngoscope Size: Hyacinth Meeker and 2 Grade View: Grade I Tube type: Oral Tube size: 7.5 mm Number of attempts: 1 Airway Equipment and Method: Stylet Placement Confirmation: ETT inserted through vocal cords under direct vision,  breath sounds checked- equal and bilateral and positive ETCO2 Secured at: 21 cm Tube secured with: Tape Dental Injury: Teeth and Oropharynx as per pre-operative assessment

## 2012-12-24 NOTE — Anesthesia Postprocedure Evaluation (Signed)
  Anesthesia Post-op Note  Patient: Kenneth Little  Procedure(s) Performed: Procedure(s) (LRB): LAPAROSCOPIC CHOLECYSTECTOMY WITH INTRAOPERATIVE CHOLANGIOGRAM (N/A)  Patient Location: PACU  Anesthesia Type: General  Level of Consciousness: awake and alert   Airway and Oxygen Therapy: Patient Spontanous Breathing  Post-op Pain: mild  Post-op Assessment: Post-op Vital signs reviewed, Patient's Cardiovascular Status Stable, Respiratory Function Stable, Patent Airway and No signs of Nausea or vomiting  Last Vitals:  Filed Vitals:   12/24/12 1813  BP: 118/54  Pulse: 70  Temp:   Resp: 17    Post-op Vital Signs: stable   Complications: No apparent anesthesia complications

## 2012-12-25 ENCOUNTER — Telehealth (INDEPENDENT_AMBULATORY_CARE_PROVIDER_SITE_OTHER): Payer: Self-pay | Admitting: *Deleted

## 2012-12-25 ENCOUNTER — Encounter (HOSPITAL_COMMUNITY): Payer: Self-pay | Admitting: Gastroenterology

## 2012-12-25 ENCOUNTER — Ambulatory Visit (INDEPENDENT_AMBULATORY_CARE_PROVIDER_SITE_OTHER): Payer: 59 | Admitting: General Surgery

## 2012-12-25 DIAGNOSIS — K810 Acute cholecystitis: Secondary | ICD-10-CM

## 2012-12-25 LAB — COMPREHENSIVE METABOLIC PANEL
AST: 59 U/L — ABNORMAL HIGH (ref 0–37)
BUN: 11 mg/dL (ref 6–23)
CO2: 21 mEq/L (ref 19–32)
Calcium: 7.3 mg/dL — ABNORMAL LOW (ref 8.4–10.5)
Creatinine, Ser: 0.74 mg/dL (ref 0.50–1.35)
GFR calc Af Amer: >90 mL/min (ref >90)
GFR calc non Af Amer: >90 mL/min (ref >90)
Potassium: 3.6 mEq/L (ref 3.5–5.1)
Sodium: 135 mEq/L (ref 135–145)
Total Protein: 4.9 g/dL — ABNORMAL LOW (ref 6.0–8.3)

## 2012-12-25 LAB — CBC WITH DIFFERENTIAL/PLATELET
Basophils Absolute: 0 10*3/uL (ref 0.0–0.1)
Basophils Relative: 0 % (ref 0–1)
Eosinophils Absolute: 0.3 10*3/uL (ref 0.0–0.7)
Eosinophils Relative: 3 % (ref 0–5)
HCT: 35.7 % — ABNORMAL LOW (ref 39.0–52.0)
Hemoglobin: 12.4 g/dL — ABNORMAL LOW (ref 13.0–17.0)
MCH: 30.8 pg (ref 26.0–34.0)
MCHC: 34.7 g/dL (ref 30.0–36.0)
MCV: 88.8 fL (ref 78.0–100.0)
Monocytes Relative: 11 % (ref 3–12)
Neutrophils Relative %: 72 % (ref 43–77)
Platelets: 105 10*3/uL — ABNORMAL LOW (ref 150–400)
RBC: 4.02 MIL/uL — ABNORMAL LOW (ref 4.22–5.81)
RDW: 14.2 % (ref 11.5–15.5)

## 2012-12-25 LAB — GLUCOSE, CAPILLARY: Glucose-Capillary: 99 mg/dL (ref 70–99)

## 2012-12-25 MED ORDER — AMOXICILLIN-POT CLAVULANATE 875-125 MG PO TABS: 1.0000 | ORAL_TABLET | Freq: Two times a day (BID) | ORAL | Status: DC

## 2012-12-25 MED ORDER — HYDROCODONE-ACETAMINOPHEN 5-325 MG PO TABS: 1.0000 | ORAL_TABLET | ORAL | Status: DC | PRN

## 2012-12-25 NOTE — Discharge Summary (Signed)
Physician Discharge Summary     Patient ID: Kenneth Little MRN: [id number] DOB/AGE: 1945-11-27 67 y.o.  Admit date: 12/22/2012 Discharge date: 12/25/2012  Discharge Diagnoses:  Principal Problem:   Gallstone pancreatitis Active Problems:   Septic shock(785.52)   E coli bacteremia   Acute cholecystitis  Detailed Hospital Course:    67 y/o male was admitted from the [facility] ED on 12/13 with septic shock from cholangitis and gallstone pancreatitis. He developed epigastric pain on 12/10 and came to the ED for evaluation. He had elevated LFTs and some gallbladder wall thickening on an ultrasound with a normal belly exam and a normal CT abdomen. He was discharged home. On 12/12 he ate fried chicken for dinner and developed worsening epigastric pain overnight and came to the ED early in the morning on 12/13. He had a fever on arrival. PCCM consulted for hypotension. GI medicine had been contacted for possible ERCP. Was admitted to the intensive care in septic shock. Admitting diagnosis included: septic shock, mild AKI, gallstone pancreatitis and cholangitis, and septic coagulopathy.   Therapeutic interventions included: central access, volume resuscitation, empiric antibiotics and both GI and surgical consultation. His RUQ ultrasound showed GB distention, wall thickening and small stones w/ hepatic duct dilation. He underwent ERCP on 12/14, sphincterotomy, ? one small stone removed, negative occlusion cholangiogram x2. Hemodynamically he stabilized, as did his renal function and platelet count, responding well to aggressive volume and resuscitation efforts as well as empiric antibiotics. His blood cultures were positive for pan-sensitive e-coli.  After collaboration between medical, GI and surgical teams it was agreed that pt would benefit from laparoscopic cholecystectomy and he underwent this on 12/15.  He tolerated this well. Remained hemodynamically stable. His pain was well controlled and her tolerated  diet in the pm hours of 12/15 as well as am of discharge 12/16. He was seen by both the critical care and surgical team on day of discharged and it was agreed collaboratively he could be discharged to home with the plan as outlined below.     Discharge Plan by active diagnoses  Gallstone pancreatitis and cholangitis (fever, elevated bili, dilated CBD) > s/p sphincterotomy and lap cholecystectomy  Cholangitis, e.coli bacteremia (pan sensitive)  P:  -switch to amoxicillin oral post op and plan to treat through 12/27  -f/u w/ surgery in 3 weeks -wound care and activity discussed with pt by surgical team  DM2  P:  Resume home regimen   Resolving thrombocytopenia P: F/u cbc out-pt  Significant Hospital tests/ studies/ interventions and procedures  Consults: Magod (GI), Ezzard Standing (general surgery) SIGNIFICANT EVENTS / STUDIES:  12/13 RUQ ultrasound > gallbladder distension with wall thickening and small stones, common hepatic duct dilated to 1.0 cm  12/14 ERCP > sphincterotomy, ?one small stone removed, negative occlusion cholangiogram x2   LINES / TUBES:  12/13 R IJ CVL > 12/27  CULTURES:  12/13 blood culture >> e.coli (pan sensitive)   ANTIBIOTICS:  12/13 zosyn >> 12/16 Augmentin 12/16>>>12/27 Discharge Exam: BP 129/61  Pulse 60  Temp(Src) 97.9 F (36.6 C) (Oral)  Resp 18  Ht 5\' 8"  (1.727 m)  Wt 86.5 kg (190 lb 11.2 oz)  BMI 29.00 kg/m2  SpO2 94% Room air   Gen: no acute distress  HEENT: NCAT, EOMi, OP clear,  PULM: CTA B  CV: RRR, no mgr, no JVD  AB: BS+, soft, no tenderness on exam RUQ or epigastric area, surgical sites intact  Ext: warm, no edema, no clubbing, no cyanosis  Derm:  no rash or skin breakdown  Neuro: A&Ox4, CN II-XII intact, [id number]   Labs at discharge Lab Results  Component Value Date   CREATININE 0.74 12/25/2012   BUN 11 12/25/2012   NA 135 12/25/2012   K 3.6 12/25/2012   CL 105 12/25/2012   CO2 21 12/25/2012   Lab Results  Component Value  Date   WBC 7.3 12/25/2012   HGB 12.4* 12/25/2012   HCT 35.7* 12/25/2012   MCV 88.8 12/25/2012   PLT 105* 12/25/2012   Lab Results  Component Value Date   ALT 154* 12/25/2012   AST 59* 12/25/2012   ALKPHOS 127* 12/25/2012   BILITOT 1.1 12/25/2012   Lab Results  Component Value Date   INR 1.02 12/22/2012   INR 0.99 03/29/2012    Current radiology studies Dg Cholangiogram Operative  12/24/2012   CLINICAL DATA:  Cholecystectomy.  EXAM: INTRAOPERATIVE CHOLANGIOGRAM  TECHNIQUE: Cholangiographic images from the C-arm fluoroscopic device were submitted for interpretation post-operatively. Please see the procedural report for the amount of contrast and the fluoroscopy time utilized.  COMPARISON:  ERCP 12/23/2012  FINDINGS: Opacification of the cystic duct, common hepatic duct and common bile duct. There is also some filling of the intrahepatic ducts. The left hepatic duct is slightly prominent. Difficult to exclude linear nonobstructive filling defects within the common hepatic duct and common bile duct. Contrast drains into the duodenum.  IMPRESSION: The biliary system is patent without obstruction or large stones.  Questionable nonobstructive filling defects that could represent sludge or blood products from recent ERCP.   Electronically Signed   By: Richarda Overlie M.D.   On: 12/24/2012 18:17   Dg Ercp Biliary & Pancreatic Ducts  12/23/2012   CLINICAL DATA:  Choledocholithiasis  EXAM: ERCP  TECHNIQUE: Multiple spot images obtained with the fluoroscopic device and submitted for interpretation post-procedure.  COMPARISON:  CT from the previous day  FINDINGS: Multiple spot films were obtained during an ERCP. 2 min of 15 seconds of fluoroscopy was utilized. No definitive filling defects are identified. There is filling of the gallbladder noted as well.   Electronically Signed   By: Alcide Clever M.D.   On: 12/23/2012 10:03    Disposition:  01-Home or Self Care      Discharge Orders   Future  Appointments Provider Department Dept Phone   01/15/2013 2:15 PM [facility], [place] [phone]   Future Orders Complete By Expires   Call MD for:  difficulty breathing, headache or visual disturbances  As directed    Call MD for:  temperature >100.4  As directed    Diet - low sodium heart healthy  As directed    Discharge wound care:  As directed    Comments:     As discussed with Dr Ezzard Standing. May shower as of 12/17   Increase activity slowly  As directed        Medication List         amoxicillin-clavulanate 875-125 MG per tablet  Commonly known as:  AUGMENTIN  Take 1 tablet by mouth 2 (two) times daily.     aspirin EC 81 MG tablet  Take 81 mg by mouth every morning.     atorvastatin 40 MG tablet  Commonly known as:  LIPITOR  Take 40 mg by mouth every evening.     calcium-vitamin D 500-200 MG-UNIT per tablet  Commonly known as:  OSCAL WITH D  Take 1 tablet by mouth every morning.  ergocalciferol 50000 UNITS capsule  Commonly known as:  VITAMIN D2  Take 50,000 Units by mouth once a week. Thursday     HYDROcodone-acetaminophen 5-325 MG per tablet  Commonly known as:  NORCO/VICODIN  Take 1-2 tablets by mouth every 4 (four) hours as needed for moderate pain.     indapamide 2.5 MG tablet  Commonly known as:  LOZOL  Take 2.5 mg by mouth every morning.     insulin glargine 100 UNIT/ML injection  Commonly known as:  LANTUS  Inject 25 Units into the skin at bedtime.     INVOKANA 300 MG Tabs  Generic drug:  Canagliflozin  Take 1 tablet by mouth every morning.     metFORMIN 1000 MG tablet  Commonly known as:  GLUCOPHAGE  Take 1,000 mg by mouth 2 (two) times daily with a meal.     ondansetron 4 MG tablet  Commonly known as:  ZOFRAN  Take 1 tablet (4 mg total) by mouth every 8 (eight) hours as needed for nausea or vomiting.     pioglitazone 45 MG tablet  Commonly known as:  ACTOS  Take 45 mg by mouth every morning.     sitaGLIPtin  100 MG tablet  Commonly known as:  JANUVIA  Take 100 mg by mouth every morning.     valsartan 320 MG tablet  Commonly known as:  DIOVAN  Take 320 mg by mouth every morning.       Follow-up Information   Follow up with [facility] [place] On 01/15/2013. (2:15pm, arrive at 1:45pm for paperwork)    Contact information:   [address]   [place] [address] [phone]       Discharged Condition: good  Physician Statement:   The Patient was personally examined, the discharge assessment and plan has been personally reviewed and I agree with ACNP Babcock's assessment and plan. > 30 minutes of time have been dedicated to discharge assessment, planning and discharge instructions.   Signed: BABCOCK,PETE 12/25/2012, 9:44 AM   Attending:  I have seen and examined the patient with nurse practitioner/resident and agree with the note above.  Yolonda Kida PCCM Pager: [phone] Cell: [phone] If no response, call [phone]

## 2012-12-25 NOTE — Progress Notes (Signed)
\  ANTIBIOTIC CONSULT NOTE - FOLLOW UP  Pharmacy Consult for Zosyn Indication: E.coli bacteremia, cholangitis, gallstone pancreatitis  No Known Allergies  Labs:  Recent Labs  12/23/12 0525  12/24/12 0425 12/24/12 1205 12/25/12 0430  WBC 7.2  --  8.9  --  7.3  HGB 12.6*  --  12.7*  --  12.4*  PLT 71*  --  94*  --  105*  CREATININE 0.90  < > 0.87 0.85 0.74  < > = values in this interval not displayed. Estimated Creatinine Clearance: 98.4 ml/min (by C-G formula based on Cr of 0.74). No results found for this basename: Rolm [place], [place], GENTTROUGH, GENTPEAK, GENTRANDOM, TOBRATROUGH, TOBRAPEAK, TOBRARND, AMIKACINPEAK, AMIKACINTROU, AMIKACIN,  in the last 72 hours     Assessment: 19 yoM admitted 12/13 with septic shock from cholangitis and gallstone pancreatitis s/p ERCP 12/14, cholecystectomy 12/15.   12/13 >> Zosyn >>  Temp: AF WBC: wnl Renal: SCr wnl, CG 98  12/13 MRSA PCR >> neg 12/13 urine >> NGF 12/13 blood >> 2/2 E.coli (pansensitive) 12/15 blood x 2 >> pending  Today is D#4 Zosyn. Repeat blood cultures pending. Surgery recommended at least total of 7 days of abx for gallbladder, defer to CCM for bacteremia treatment.   Plan:   Continue Zosyn 3.375 gm IV q8h extended infusion over 4 hours  Pharmacy will sign off from formal notes writing for Zosyn consult - will f/u peripherally  Geoffry Paradise, PharmD, BCPS Pager: [phone] 9:05 AM Pharmacy #: [id number]

## 2012-12-25 NOTE — Telephone Encounter (Signed)
Patient's daughter called reporting that patient has some swelling in his thigh, patient reports feeling like his abdomen is distended, and there is a creamy color discharge coming from umbilical incision.  She also reports that patients abdominal feels hard.  Tried to give daughter reason's for the symptoms however daughter after every suggestion stated "you don't understand my father has been very sick and almost died".  Daughter states that patient had sepsis and is currently having to take antibiotic.  Explained that these symptoms can not be assessed over the phone and it sounds like she is very concerned so I suggested that they take patient to the ED for evaluation however then daughter states she has already taken patient several times to the ED and doesn't want to take him again.  Asked daughter how I could help since she doesn't want him to go to the ED and she doesn't want my suggestions.  Daughter states that she wants to speak with Dr. Ezzard Standing who can tell her what is going on.  Tried to explain again that an assessment can not be done over the phone however I will send a message to Dr. Ezzard Standing letting him know what is going on and ask if he would be willing to call.  I am unsure how to help this daughter in any other way then to try to pass it to Dr. Ezzard Standing.  Page sent to Dr. Ezzard Standing at this time.  Awaiting response, this message also being forwarded to him as well.

## 2012-12-25 NOTE — Progress Notes (Signed)
Patient ID: Kenneth Little, male   DOB: Dec 28, 1945, 67 y.o.   MRN: [id number] 1 Day Post-Op  Subjective: Pt looks great today.  Feels well except some soreness at his umbilicus.  Otherwise no pain.  Tolerating liquids.  No nausea  Objective: Vital signs in last 24 hours: Temp:  [98 F (36.7 C)-98.9 F (37.2 C)] 98.9 F (37.2 C) (12/16 0400) Pulse Rate:  [35-85] 56 (12/16 0600) Resp:  [13-19] 13 (12/16 0600) BP: (115-132)/(51-64) 120/51 mmHg (12/16 0600) SpO2:  [89 %-100 %] 97 % (12/16 0600) Weight:  [190 lb 11.2 oz (86.5 kg)] 190 lb 11.2 oz (86.5 kg) (12/16 0400) Last BM Date: 12/24/12  Intake/Output from previous day: 12/15 0701 - 12/16 0700 In: 2115 [I.V.:1690; IV Piggyback:425] Out: 1550 [Urine:1500; Blood:50] Intake/Output this shift:    PE: Abd: soft, appropriately tender, +BS, ND, incisions c/d/i with mild ecchymosis around his umbilicus  Lab Results:   Recent Labs  12/24/12 0425 12/25/12 0430  WBC 8.9 7.3  HGB 12.7* 12.4*  HCT 36.4* 35.7*  PLT 94* 105*   BMET  Recent Labs  12/24/12 1205 12/25/12 0430  NA 137 135  K 3.3* 3.6  CL 105 105  CO2 20 21  GLUCOSE 79 101*  BUN 14 11  CREATININE 0.85 0.74  CALCIUM 7.2* 7.3*   PT/INR  Recent Labs  12/22/12 1045  LABPROT 13.2  INR 1.02   CMP     Component Value Date/Time   NA 135 12/25/2012 0430   K 3.6 12/25/2012 0430   CL 105 12/25/2012 0430   CO2 21 12/25/2012 0430   GLUCOSE 101* 12/25/2012 0430   BUN 11 12/25/2012 0430   CREATININE 0.74 12/25/2012 0430   CALCIUM 7.3* 12/25/2012 0430   PROT 4.9* 12/25/2012 0430   ALBUMIN 2.0* 12/25/2012 0430   AST 59* 12/25/2012 0430   ALT 154* 12/25/2012 0430   ALKPHOS 127* 12/25/2012 0430   BILITOT 1.1 12/25/2012 0430   GFRNONAA >90 12/25/2012 0430   GFRAA >90 12/25/2012 0430   Lipase     Component Value Date/Time   LIPASE 35 12/24/2012 0425       Studies/Results: Dg Cholangiogram Operative  12/24/2012   CLINICAL DATA:  Cholecystectomy.  EXAM:  INTRAOPERATIVE CHOLANGIOGRAM  TECHNIQUE: Cholangiographic images from the C-arm fluoroscopic device were submitted for interpretation post-operatively. Please see the procedural report for the amount of contrast and the fluoroscopy time utilized.  COMPARISON:  ERCP 12/23/2012  FINDINGS: Opacification of the cystic duct, common hepatic duct and common bile duct. There is also some filling of the intrahepatic ducts. The left hepatic duct is slightly prominent. Difficult to exclude linear nonobstructive filling defects within the common hepatic duct and common bile duct. Contrast drains into the duodenum.  IMPRESSION: The biliary system is patent without obstruction or large stones.  Questionable nonobstructive filling defects that could represent sludge or blood products from recent ERCP.   Electronically Signed   By: Richarda Overlie M.D.   On: 12/24/2012 18:17   Dg Ercp Biliary & Pancreatic Ducts  12/23/2012   CLINICAL DATA:  Choledocholithiasis  EXAM: ERCP  TECHNIQUE: Multiple spot images obtained with the fluoroscopic device and submitted for interpretation post-procedure.  COMPARISON:  CT from the previous day  FINDINGS: Multiple spot films were obtained during an ERCP. 2 min of 15 seconds of fluoroscopy was utilized. No definitive filling defects are identified. There is filling of the gallbladder noted as well.   Electronically Signed   By: Alcide Clever  M.D.   On: 12/23/2012 10:03    Anti-infectives: Anti-infectives   Start     Dose/Rate Route Frequency Ordered Stop   12/22/12 1400  piperacillin-tazobactam (ZOSYN) IVPB 3.375 g     3.375 g 12.5 mL/hr over 240 Minutes Intravenous 3 times per day 12/22/12 1014     12/22/12 0615  piperacillin-tazobactam (ZOSYN) IVPB 3.375 g     3.375 g 100 mL/hr over 30 Minutes Intravenous  Once 12/22/12 0601 12/22/12 0723       Assessment/Plan  1. POD 1 s/p lap chole  2. E. Coli Bacteremia 3. DM  Plan: 1. Pt doing great from a surgical standpoint 2. Advance to  a carb mod diet, if he tolerates this he is stable from a surgical standpoint for dc home today.  Would recommend at least a total of 7 days of abx therapy for his gallbladder.  Today is day 4 of zosyn.  He could be converted to Augmentin for 3 additional days or whatever CCM would prefer for bacteremia. 3. Follow up with [place] in the office in 2-3 weeks.   LOS: 3 days    OSBORNE,KELLY E 12/25/2012, 8:21 AM Pager: [phone]  Agree with above. He looks very good - wounds look good. He'll go home on antibiotics. He'll see me back in 2 to 3 weeks.   Anders Simmonds in the room with me when I saw the patient.  Ovidio Kin, MD, [facility] Pager: [phone] Office phone:  [phone]

## 2012-12-26 ENCOUNTER — Emergency Department (HOSPITAL_COMMUNITY)
Admission: EM | Admit: 2012-12-26 | Discharge: 2012-12-26 | Disposition: A | Discharge status: Home / Self Care | Payer: Medicare Other | Attending: Emergency Medicine | Admitting: Emergency Medicine

## 2012-12-26 ENCOUNTER — Telehealth (INDEPENDENT_AMBULATORY_CARE_PROVIDER_SITE_OTHER): Payer: Self-pay

## 2012-12-26 ENCOUNTER — Encounter (HOSPITAL_COMMUNITY): Payer: Self-pay | Admitting: Emergency Medicine

## 2012-12-26 DIAGNOSIS — Z792 Long term (current) use of antibiotics: Secondary | ICD-10-CM | POA: Insufficient documentation

## 2012-12-26 DIAGNOSIS — Z7982 Long term (current) use of aspirin: Secondary | ICD-10-CM | POA: Insufficient documentation

## 2012-12-26 DIAGNOSIS — Z794 Long term (current) use of insulin: Secondary | ICD-10-CM | POA: Insufficient documentation

## 2012-12-26 DIAGNOSIS — E119 Type 2 diabetes mellitus without complications: Secondary | ICD-10-CM | POA: Insufficient documentation

## 2012-12-26 DIAGNOSIS — Z87891 Personal history of nicotine dependence: Secondary | ICD-10-CM | POA: Insufficient documentation

## 2012-12-26 DIAGNOSIS — M7981 Nontraumatic hematoma of soft tissue: Secondary | ICD-10-CM | POA: Insufficient documentation

## 2012-12-26 DIAGNOSIS — R58 Hemorrhage, not elsewhere classified: Secondary | ICD-10-CM

## 2012-12-26 DIAGNOSIS — R609 Edema, unspecified: Secondary | ICD-10-CM

## 2012-12-26 DIAGNOSIS — I10 Essential (primary) hypertension: Secondary | ICD-10-CM | POA: Insufficient documentation

## 2012-12-26 DIAGNOSIS — E785 Hyperlipidemia, unspecified: Secondary | ICD-10-CM | POA: Insufficient documentation

## 2012-12-26 DIAGNOSIS — R141 Gas pain: Secondary | ICD-10-CM | POA: Insufficient documentation

## 2012-12-26 DIAGNOSIS — R142 Eructation: Secondary | ICD-10-CM | POA: Insufficient documentation

## 2012-12-26 DIAGNOSIS — Z9089 Acquired absence of other organs: Secondary | ICD-10-CM | POA: Insufficient documentation

## 2012-12-26 DIAGNOSIS — Z87440 Personal history of urinary (tract) infections: Secondary | ICD-10-CM | POA: Insufficient documentation

## 2012-12-26 DIAGNOSIS — Z9889 Other specified postprocedural states: Secondary | ICD-10-CM | POA: Insufficient documentation

## 2012-12-26 LAB — CBC WITH DIFFERENTIAL/PLATELET
Basophils Absolute: 0 10*3/uL (ref 0.0–0.1)
Eosinophils Absolute: 0.2 10*3/uL (ref 0.0–0.7)
Hemoglobin: 13.1 g/dL (ref 13.0–17.0)
Lymphocytes Relative: 18 % (ref 12–46)
Lymphs Abs: 1.2 10*3/uL (ref 0.7–4.0)
MCH: 30.4 pg (ref 26.0–34.0)
MCHC: 34.6 g/dL (ref 30.0–36.0)
MCV: 87.9 fL (ref 78.0–100.0)
Monocytes Relative: 12 % (ref 3–12)
Neutro Abs: 4.6 10*3/uL (ref 1.7–7.7)
Neutrophils Relative %: 67 % (ref 43–77)
Platelets: 158 10*3/uL (ref 150–400)
RBC: 4.31 MIL/uL (ref 4.22–5.81)
WBC: 6.8 10*3/uL (ref 4.0–10.5)

## 2012-12-26 LAB — COMPREHENSIVE METABOLIC PANEL
ALT: 150 U/L — ABNORMAL HIGH (ref 0–53)
AST: 81 U/L — ABNORMAL HIGH (ref 0–37)
BUN: 10 mg/dL (ref 6–23)
CO2: 24 mEq/L (ref 19–32)
Chloride: 101 mEq/L (ref 96–112)
Creatinine, Ser: 0.74 mg/dL (ref 0.50–1.35)
GFR calc non Af Amer: >90 mL/min (ref >90)
Total Bilirubin: 1.6 mg/dL — ABNORMAL HIGH (ref 0.3–1.2)
Total Protein: 5.9 g/dL — ABNORMAL LOW (ref 6.0–8.3)

## 2012-12-26 LAB — LIPASE, BLOOD: Lipase: 106 U/L — ABNORMAL HIGH (ref 11–59)

## 2012-12-26 LAB — PROTIME-INR
INR: 0.84 (ref 0.00–1.49)
Prothrombin Time: 11.4 seconds — ABNORMAL LOW (ref 11.6–15.2)

## 2012-12-26 NOTE — ED Provider Notes (Signed)
CSN: [id number]     Arrival date & time 12/26/12  1547 History   First MD Initiated Contact with Patient 12/26/12 1658     Chief Complaint  Patient presents with  . Leg Swelling  . Abdominal distention     HPI  Patient presents with a complaint that his abdomen is distended he has some bruising in his abdominal wall and swelling in both legs. Patient presented 5 days ago and septic shock. Blood cultures grew Escherichia coli. Found to have a gallstone pancreatitis. He cleared his pancreatitis. Underwent normal ERCP. And then underwent laparoscopic cholecystectomy 2 days ago. He improved well. Had to be markedly fluid resuscitated on his arrival 5 days ago. He was throughout much of the hospital. At home yesterday. Continues to have a good urinary output. Was up and around much more today than he has been noticed his legs became swollen. He was in his abdomen presented more today it is inferior to/ gravitational to his incisions. His normal urine output or actually increased urine output. His urine is clear not dark-colored he has normal stools are not acholic or bloody or black. He is breathing without difficulty he has minimal incisional pain. No incisional discharge.  Past Medical History  Diagnosis Date  . Hypertension   . Hyperlipidemia   . UTI (lower urinary tract infection)   . Diabetes mellitus without complication    Past Surgical History  Procedure Laterality Date  . Hemorrhoid surgery  03/20/12    internal  . Diverticulitis    . Abdominal surgery    . Hernia repair    . Ercp N/A 12/23/2012    Procedure: ENDOSCOPIC RETROGRADE CHOLANGIOPANCREATOGRAPHY (ERCP);  Surgeon: Petra Kuba, MD;  Location: [facility];  Service: Endoscopy;  Laterality: N/A;  . Cholecystectomy N/A 12/24/2012    Procedure: LAPAROSCOPIC CHOLECYSTECTOMY WITH INTRAOPERATIVE CHOLANGIOGRAM;  Surgeon: Kandis Cocking, MD;  Location: [facility];  Service: General;  Laterality: N/A;   Family History  Problem Relation Age  of Onset  . Cancer Mother     breast  . Hypertension Father   . Diabetes Brother    History  Substance Use Topics  . Smoking status: Former Smoker    Types: Cigarettes    Quit date: 11/20/1988  . Smokeless tobacco: Never Used  . Alcohol Use: No    Review of Systems  Constitutional: Negative for fever, chills, diaphoresis, appetite change and fatigue.  HENT: Negative for mouth sores, sore throat and trouble swallowing.   Eyes: Negative for visual disturbance.  Respiratory: Negative for cough, chest tightness, shortness of breath and wheezing.   Cardiovascular: Positive for leg swelling. Negative for chest pain.  Gastrointestinal: Positive for abdominal distention. Negative for nausea, vomiting, abdominal pain and diarrhea.  Endocrine: Positive for polyuria. Negative for polydipsia and polyphagia.  Genitourinary: Negative for dysuria, frequency and hematuria.  Musculoskeletal: Negative for gait problem.  Skin: Negative for color change, pallor and rash.  Neurological: Negative for dizziness, syncope, light-headedness and headaches.  Hematological: Bruises/bleeds easily.  Psychiatric/Behavioral: Negative for behavioral problems and confusion.    Allergies  Review of patient's allergies indicates no known allergies.  Home Medications   Current Outpatient Rx  Name  Route  Sig  Dispense  Refill  . amoxicillin-clavulanate (AUGMENTIN) 875-125 MG per tablet   Oral   Take 1 tablet by mouth 2 (two) times daily.   22 tablet   0   . aspirin EC 81 MG tablet   Oral   Take 81 mg  by mouth every morning.          Marland Kitchen atorvastatin (LIPITOR) 40 MG tablet   Oral   Take 40 mg by mouth every evening.          . calcium-vitamin D (OSCAL WITH D) 500-200 MG-UNIT per tablet   Oral   Take 1 tablet by mouth every morning.         . ergocalciferol (VITAMIN D2) 50000 UNITS capsule   Oral   Take 50,000 Units by mouth once a week. Thursday         . indapamide (LOZOL) 2.5 MG tablet    Oral   Take 2.5 mg by mouth every morning.         . insulin glargine (LANTUS) 100 UNIT/ML injection   Subcutaneous   Inject 25 Units into the skin at bedtime.          . INVOKANA 300 MG TABS   Oral   Take 1 tablet by mouth every morning.          . lactobacillus acidophilus (BACID) TABS tablet   Oral   Take 2 tablets by mouth 2 (two) times daily. Taking with antibiotic         . metFORMIN (GLUCOPHAGE) 1000 MG tablet   Oral   Take 1,000 mg by mouth 2 (two) times daily with a meal.         . ondansetron (ZOFRAN) 4 MG tablet   Oral   Take 1 tablet (4 mg total) by mouth every 8 (eight) hours as needed for nausea or vomiting.   10 tablet   0   . pioglitazone (ACTOS) 45 MG tablet   Oral   Take 45 mg by mouth every morning.          . sitaGLIPtin (JANUVIA) 100 MG tablet   Oral   Take 100 mg by mouth every morning.          . valsartan (DIOVAN) 320 MG tablet   Oral   Take 320 mg by mouth every morning.          Marland Kitchen HYDROcodone-acetaminophen (NORCO/VICODIN) 5-325 MG per tablet   Oral   Take 1-2 tablets by mouth every 4 (four) hours as needed for moderate pain.   30 tablet   0    BP 124/56  Pulse 76  Temp(Src) 98.3 F (36.8 C) (Oral)  Resp 16  SpO2 95% Physical Exam  Constitutional: He is oriented to person, place, and time. He appears well-developed and well-nourished. No distress.  HENT:  Head: Normocephalic.  Eyes: Conjunctivae are normal. Pupils are equal, round, and reactive to light. No scleral icterus.  Neck: Normal range of motion. Neck supple. No thyromegaly present.  Cardiovascular: Normal rate and regular rhythm.  Exam reveals no gallop and no friction rub.   No murmur heard. Is not tachycardic  Pulmonary/Chest: Effort normal and breath sounds normal. No respiratory distress. He has no wheezes. He has no rales.  Abdominal: Soft. Bowel sounds are normal. He exhibits no distension. There is no tenderness. There is no rebound.     Musculoskeletal: Normal range of motion.  Neurological: He is alert and oriented to person, place, and time.  Skin: Skin is warm and dry. No rash noted.     Psychiatric: He has a normal mood and affect. His behavior is normal.    ED Course  Procedures (including critical care time) Labs Review Labs Reviewed  COMPREHENSIVE METABOLIC PANEL - Abnormal; Notable for the  following:    Glucose, Bld 113 (*)    Total Protein 5.9 (*)    Albumin 2.6 (*)    AST 81 (*)    ALT 150 (*)    Alkaline Phosphatase 132 (*)    Total Bilirubin 1.6 (*)    All other components within normal limits  CBC WITH DIFFERENTIAL - Abnormal; Notable for the following:    HCT 37.9 (*)    All other components within normal limits  PROTIME-INR - Abnormal; Notable for the following:    Prothrombin Time 11.4 (*)    All other components within normal limits  LIPASE, BLOOD - Abnormal; Notable for the following:    Lipase 106 (*)    All other components within normal limits   Imaging Review No results found.  EKG Interpretation   None       MDM   1. Dependent edema   2. Ecchymosis    Patient has no abdominal complaints. He is not nauseated. He is not vomiting. Systolic his abdomen was a little distended. It is soft to examine. His incisions appear well healed. He has some ecchymosis inferior to his incisions. His bilirubin has a 1.6 is 1.1 with a left hospital. The remainder of his transaminases show no significant change. His lipase is 106. His INR is normal. Discussion I think is her normal labs considering her recent episode of cholecystitis and pancreatitis her manager is a normal intake. He is asymptomatic regarding his intake. He has symmetric dependent edema think is supple third spacing and was secondary large volume crystalloid resuscitation he received. Encouraged to stay active. When he is inactive, I have asked him to elevate his lower extremities.    Roney Marion, MD 12/26/12 [phone]

## 2012-12-26 NOTE — Telephone Encounter (Signed)
Pts wife called stating pt now has swelling in both lower legs. I advised her that the daughter was advised pt should be taken to ER yesterday and that she declined. I advised pts wife that these symptoms could be serious and it is very important to pt that he be taken back to ER and have the reason for this swelling evalulated. I advised her to take him now. She states she understands and will call her daughter to take them to ER.

## 2012-12-26 NOTE — ED Notes (Signed)
Patient denies pain and is resting comfortably.  

## 2012-12-26 NOTE — ED Notes (Signed)
Pt presents with an abdominal echmotic/purple-pink bruising surrounding surgical wounds. Although it concerns the pt as the onset was today in a.m and has spread fast, denies pain at this locations. Denies being on blood any blood thinners.

## 2012-12-26 NOTE — ED Notes (Signed)
MD(Dr. Fayrene Fearing) at bedside.

## 2012-12-26 NOTE — Telephone Encounter (Signed)
Pt.s family member called  asking if Dr Ezzard Standing needed to know that patient was currently in the ER at [facility] for abdominal pain, swelling and bruising. Advised will be evaluated by the doctors in ER and they will call our on call doctor if he is having complication from surgery but that I would also route a message to him letting him know.

## 2012-12-26 NOTE — ED Notes (Addendum)
Pt c/o BLE swelling, coughing, and abdominal distention w/ increased bruising.  Denies pain.  Pt d/c'ed yesterday from [facility] after have gallbladder removed.  Pt's daughter sts they called the surgeon and was told that "it seemed ok, but to come in if symptoms worsened."

## 2012-12-31 LAB — CULTURE, BLOOD (ROUTINE X 2)
Culture: NO GROWTH
Culture: NO GROWTH

## 2013-01-05 LAB — ALDOSTERONE + RENIN ACTIVITY W/ RATIO
ALDO / PRA Ratio: 0.4 Ratio — ABNORMAL LOW (ref 0.9–28.9)
PRA LC/MS/MS: 2.44 ng/mL/h (ref 0.25–5.82)

## 2013-01-15 ENCOUNTER — Ambulatory Visit (INDEPENDENT_AMBULATORY_CARE_PROVIDER_SITE_OTHER): Payer: Medicare Other | Admitting: General Surgery

## 2013-01-15 ENCOUNTER — Encounter (INDEPENDENT_AMBULATORY_CARE_PROVIDER_SITE_OTHER): Payer: Self-pay

## 2013-01-15 VITALS — BP 110/68 | HR 72 | Temp 98.6°F | Resp 14 | Ht 67.5 in | Wt 163.2 lb

## 2013-01-15 DIAGNOSIS — K851 Biliary acute pancreatitis without necrosis or infection: Secondary | ICD-10-CM

## 2013-01-15 DIAGNOSIS — K810 Acute cholecystitis: Secondary | ICD-10-CM

## 2013-01-15 DIAGNOSIS — K859 Acute pancreatitis without necrosis or infection, unspecified: Secondary | ICD-10-CM

## 2013-01-15 NOTE — Progress Notes (Signed)
  Subjective: Kenneth Little is a 68 y.o. male who had a laparoscopic cholecystectomy  on 12/24/12 by Dr. Lucia Gaskins  returns to the clinic today.  Pathology reveals Acute and chronic cholecystitis with cholelithiasis.  The patient is tolerating their diet well and is having no severe pain.  Bowel function is good.  The pre-operative symptoms of abdominal pain, nausea, and vomiting have resolved.  No problems with the wounds.  Pt is returning to normal activity / work.   Objective: Vital signs in last 24 hours: Reviewed   PE: General:  Alert, NAD, pleasant Abdomen:  soft, NT/ND, +bs, incisions appear well-healed with no sign of infection or bleeding   Assessment/Plan  1.  S/P Laparoscopic Cholecystectomy for gallstone pancreatitis and cholecystitis/cholelithiasis: doing well, may resume regular activity without restrictions (except return to basketball slowly), Pt will follow up with [place] PRN and knows to call with questions or concerns.      Coralie Keens, PA-C 01/15/2013

## 2013-01-15 NOTE — Patient Instructions (Addendum)
He may resume a regular diet.  He may follow-up on a PRN basis.  Return to basketball slowly and can return to full activity at 6 weeks.   No lifting >15lbs for 6 weeks.

## 2013-01-31 ENCOUNTER — Encounter (INDEPENDENT_AMBULATORY_CARE_PROVIDER_SITE_OTHER): Payer: Self-pay | Admitting: Surgery

## 2013-04-03 LAB — HM COLONOSCOPY: HM Colonoscopy: NORMAL

## 2013-05-29 ENCOUNTER — Other Ambulatory Visit (INDEPENDENT_AMBULATORY_CARE_PROVIDER_SITE_OTHER): Payer: Medicare Other

## 2013-05-29 DIAGNOSIS — N400 Enlarged prostate without lower urinary tract symptoms: Secondary | ICD-10-CM

## 2013-05-29 DIAGNOSIS — Z0000 Encounter for general adult medical examination without abnormal findings: Secondary | ICD-10-CM

## 2013-05-29 DIAGNOSIS — R972 Elevated prostate specific antigen [PSA]: Secondary | ICD-10-CM

## 2013-05-29 DIAGNOSIS — E119 Type 2 diabetes mellitus without complications: Secondary | ICD-10-CM

## 2013-05-29 LAB — POCT URINALYSIS DIPSTICK
BILIRUBIN UA: NEGATIVE
Blood, UA: NEGATIVE
Ketones, UA: NEGATIVE
Leukocytes, UA: NEGATIVE
Nitrite, UA: NEGATIVE
Protein, UA: NEGATIVE
SPEC GRAV UA: 1.02
Urobilinogen, UA: 0.2
pH, UA: 5.5

## 2013-05-29 LAB — CBC WITH DIFFERENTIAL/PLATELET
Basophils Absolute: 0 10*3/uL (ref 0.0–0.1)
Basophils Relative: 0.3 % (ref 0.0–3.0)
EOS PCT: 4.7 % (ref 0.0–5.0)
Eosinophils Absolute: 0.3 10*3/uL (ref 0.0–0.7)
HCT: 45.7 % (ref 39.0–52.0)
Hemoglobin: 15.4 g/dL (ref 13.0–17.0)
Lymphocytes Relative: 40.8 % (ref 12.0–46.0)
Lymphs Abs: 2.4 10*3/uL (ref 0.7–4.0)
MCHC: 33.6 g/dL (ref 30.0–36.0)
MCV: 93.2 fl (ref 78.0–100.0)
MONOS PCT: 9.8 % (ref 3.0–12.0)
Monocytes Absolute: 0.6 10*3/uL (ref 0.1–1.0)
NEUTROS ABS: 2.6 10*3/uL (ref 1.4–7.7)
Neutrophils Relative %: 44.4 % (ref 43.0–77.0)
Platelets: 205 10*3/uL (ref 150.0–400.0)
RBC: 4.91 Mil/uL (ref 4.22–5.81)
RDW: 13.9 % (ref 11.5–15.5)
WBC: 5.8 10*3/uL (ref 4.0–10.5)

## 2013-05-29 LAB — LIPID PANEL
Cholesterol: 106 mg/dL (ref 0–200)
HDL: 46.4 mg/dL (ref >39.00)
LDL Cholesterol: 54 mg/dL (ref 0–99)
TRIGLYCERIDES: 27 mg/dL (ref 0.0–149.0)
Total CHOL/HDL Ratio: 2
VLDL: 5.4 mg/dL (ref 0.0–40.0)

## 2013-05-29 LAB — HEPATIC FUNCTION PANEL
ALBUMIN: 3.8 g/dL (ref 3.5–5.2)
ALK PHOS: 53 U/L (ref 39–117)
ALT: 21 U/L (ref 0–53)
AST: 24 U/L (ref 0–37)
Bilirubin, Direct: 0.2 mg/dL (ref 0.0–0.3)
Total Bilirubin: 1.3 mg/dL — ABNORMAL HIGH (ref 0.2–1.2)
Total Protein: 6.4 g/dL (ref 6.0–8.3)

## 2013-05-29 LAB — BASIC METABOLIC PANEL
BUN: 24 mg/dL — AB (ref 6–23)
CHLORIDE: 100 meq/L (ref 96–112)
CO2: 29 meq/L (ref 19–32)
CREATININE: 0.9 mg/dL (ref 0.4–1.5)
Calcium: 9.7 mg/dL (ref 8.4–10.5)
GFR: 88.73 mL/min (ref >60.00)
GLUCOSE: 96 mg/dL (ref 70–99)
Potassium: 3.3 mEq/L — ABNORMAL LOW (ref 3.5–5.1)
Sodium: 138 mEq/L (ref 135–145)

## 2013-05-29 LAB — HEMOGLOBIN A1C: Hgb A1c MFr Bld: 7.8 % — ABNORMAL HIGH (ref 4.6–6.5)

## 2013-05-29 LAB — PSA: PSA: 7.8 ng/mL — ABNORMAL HIGH (ref 0.10–4.00)

## 2013-05-29 LAB — MICROALBUMIN / CREATININE URINE RATIO
CREATININE, U: 91 mg/dL
MICROALB UR: 0.7 mg/dL (ref 0.0–1.9)
MICROALB/CREAT RATIO: 0.8 mg/g (ref 0.0–30.0)

## 2013-05-29 LAB — TSH: TSH: 1.55 u[IU]/mL (ref 0.35–4.50)

## 2013-05-29 NOTE — Addendum Note (Signed)
Addended by: Townsend Roger D on: 05/29/2013 10:25 AM   Modules accepted: Orders

## 2013-05-29 NOTE — Addendum Note (Signed)
Addended by: Townsend Roger D on: 05/29/2013 10:07 AM   Modules accepted: Orders

## 2013-06-05 ENCOUNTER — Encounter: Payer: Self-pay | Admitting: Internal Medicine

## 2013-06-05 ENCOUNTER — Ambulatory Visit (INDEPENDENT_AMBULATORY_CARE_PROVIDER_SITE_OTHER): Payer: Medicare Other | Admitting: Internal Medicine

## 2013-06-05 VITALS — BP 120/70 | HR 60 | Temp 98.5°F | Resp 20 | Ht 66.0 in | Wt 175.0 lb

## 2013-06-05 DIAGNOSIS — E7800 Pure hypercholesterolemia, unspecified: Secondary | ICD-10-CM

## 2013-06-05 DIAGNOSIS — Z0000 Encounter for general adult medical examination without abnormal findings: Secondary | ICD-10-CM

## 2013-06-05 DIAGNOSIS — N400 Enlarged prostate without lower urinary tract symptoms: Secondary | ICD-10-CM

## 2013-06-05 DIAGNOSIS — E119 Type 2 diabetes mellitus without complications: Secondary | ICD-10-CM

## 2013-06-05 DIAGNOSIS — R972 Elevated prostate specific antigen [PSA]: Secondary | ICD-10-CM

## 2013-06-05 DIAGNOSIS — Z23 Encounter for immunization: Secondary | ICD-10-CM

## 2013-06-05 DIAGNOSIS — Z86010 Personal history of colonic polyps: Secondary | ICD-10-CM

## 2013-06-05 DIAGNOSIS — I10 Essential (primary) hypertension: Secondary | ICD-10-CM

## 2013-06-05 NOTE — Progress Notes (Signed)
Subjective:    Patient ID: Kenneth Little, male    DOB: [birth year], 68 y.o.   MRN: [id number]  HPI 68 year old patient who is seen today for an annual exam.  He is followed by endocrine for a type 2 diabetes which he has had for at least 15 years. He is on Lantus insulin as well as oral medications. He has treated hypertension and is also on atorvastatin for dyslipidemia. He is seen annually for eye examinations by Marica Otter. His last colonoscopy was recently performed by Dr. Collene Mares.   He has a history of colonic polyps. He's also followed Dr. Jeffie Pollock for BPH and a history of elevated PSA. He has had a prostate biopsy in the past.   Since establishing with the practice, he was admitted in March of 2014 for Escherichia coli sepsis complicating hemorrhoidectomy.  Is also status post laparoscopic cholecystectomy in December of 2014.  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  He plans to retire in June 2015;  Nonsmoker. He does exercise regularly including [facility] [employer] [facility]  Past Medical History  Diagnosis Date  . Hypertension   . Hyperlipidemia   . UTI (lower urinary tract infection)   . Diabetes mellitus without complication     History   Social History  . Marital Status: Married    Spouse Name: N/A    Number of Children: N/A  . Years of Education: N/A   Occupational History  . Not on file.   Social History Main Topics  . Smoking status: Former Smoker    Types: Cigarettes    Quit date: 11/20/1988  . Smokeless tobacco: Never Used  . Alcohol Use: No  . Drug Use: No  . Sexual Activity: Yes   Other Topics Concern  . Not on file   Social History Narrative  . No narrative on file    Past Surgical History  Procedure Laterality Date  . Hemorrhoid surgery  03/20/12    internal  . Diverticulitis     . Abdominal surgery    . Hernia repair    . Ercp N/A 12/23/2012    Procedure: ENDOSCOPIC RETROGRADE CHOLANGIOPANCREATOGRAPHY (ERCP);  Surgeon: Jeryl Columbia, MD;  Location: [facility];  Service: Endoscopy;  Laterality: N/A;  . Cholecystectomy N/A 12/24/2012    Procedure: LAPAROSCOPIC CHOLECYSTECTOMY WITH INTRAOPERATIVE CHOLANGIOGRAM;  Surgeon: Shann Medal, MD;  Location: [facility];  Service: General;  Laterality: N/A;    Family History  Problem Relation Age of Onset  . Cancer Mother     breast  . Hypertension Father   . Diabetes Brother     No Known Allergies  Current Outpatient Prescriptions on File Prior to Visit  Medication Sig Dispense Refill  . aspirin EC 81 MG tablet Take 81 mg by mouth every morning.       Marland Kitchen atorvastatin (LIPITOR) 40 MG tablet Take 40 mg by mouth every evening.       . calcium-vitamin D (OSCAL WITH D) 500-200 MG-UNIT per tablet Take 1 tablet by mouth every morning.      . ergocalciferol (VITAMIN D2) 50000 UNITS capsule Take 50,000 Units by mouth once a week. Thursday      . indapamide (LOZOL) 2.5 MG tablet Take 2.5 mg by mouth every morning.      Marland Kitchen  insulin glargine (LANTUS) 100 UNIT/ML injection Inject 25 Units into the skin at bedtime.       . INVOKANA 300 MG TABS Take 1 tablet by mouth every morning.       . metFORMIN (GLUCOPHAGE) 1000 MG tablet Take 1,000 mg by mouth 2 (two) times daily with a meal.      . pioglitazone (ACTOS) 45 MG tablet Take 45 mg by mouth every morning.       . sitaGLIPtin (JANUVIA) 100 MG tablet Take 100 mg by mouth every morning.       . valsartan (DIOVAN) 320 MG tablet Take 320 mg by mouth every morning.        No current facility-administered medications on file prior to visit.    There were no vitals taken for this visit.  1. Risk factors, based on past  M,S,F history-cardiovascular risk factors include hypertension, and diabetes.  He is on statin therapy  2.  Physical activities: Quite active participates in half court  basketball  3.  Depression/mood: No history of depression or mood disorder  4.  Hearing: No deficits  5.  ADL's: Totally independent  6.  Fall risk: Low  7.  Home safety: No problems identified  8.  Height weight, and visual acuity; height and weight stable.  No change in visual acuity eye examination scheduled in August  9.  Counseling: Regular.  Exercise moderate weight loss encouraged  10. Lab orders based on risk factors: Larger profile reviewed.  Hemoglobin A1c is 7 point 8  11. Referral : Followup for endocrinology and urology  12. Care plan: Heart healthy diet, modest weight loss recommended  13. Cognitive assessment: Alert, in order with normal affect.  No cognitive dysfunction        Review of Systems  Constitutional: Negative for fever, chills, activity change, appetite change and fatigue.  HENT: Negative for congestion, dental problem, ear pain, hearing loss, mouth sores, rhinorrhea, sinus pressure, sneezing, tinnitus, trouble swallowing and voice change.   Eyes: Negative for photophobia, pain, redness and visual disturbance.  Respiratory: Negative for apnea, cough, choking, chest tightness, shortness of breath and wheezing.   Cardiovascular: Negative for chest pain, palpitations and leg swelling.  Gastrointestinal: Negative for nausea, vomiting, abdominal pain, diarrhea, constipation, blood in stool, abdominal distention, anal bleeding and rectal pain.  Genitourinary: Negative for dysuria, urgency, frequency, hematuria, flank pain, decreased urine volume, discharge, penile swelling, scrotal swelling, difficulty urinating, genital sores and testicular pain.  Musculoskeletal: Negative for arthralgias, back pain, gait problem, joint swelling, myalgias, neck pain and neck stiffness.  Skin: Negative for color change, rash and wound.  Neurological: Negative for dizziness, tremors, seizures, syncope, facial asymmetry, speech difficulty, weakness, light-headedness,  numbness and headaches.  Hematological: Negative for adenopathy. Does not bruise/bleed easily.  Psychiatric/Behavioral: Negative for suicidal ideas, hallucinations, behavioral problems, confusion, sleep disturbance, self-injury, dysphoric mood, decreased concentration and agitation. The patient is not nervous/anxious.        Objective:   Physical Exam  Constitutional: He is oriented to person, place, and time. He appears well-developed.  HENT:  Head: Normocephalic.  Right Ear: External ear normal.  Left Ear: External ear normal.  Eyes: Conjunctivae and EOM are normal.  Neck: Normal range of motion.  Cardiovascular: Normal rate and normal heart sounds.   Pulmonary/Chest: Breath sounds normal.  Abdominal: Bowel sounds are normal.  Musculoskeletal: Normal range of motion. He exhibits no edema and no tenderness.  Neurological: He is alert and oriented to person, place, and time.  Psychiatric: He has a normal mood and affect. His behavior is normal.          Assessment & Plan:   Preventive health examination Diabetes mellitus. Followup endocrinology.  Suboptimal control Hypertension stable.  Blood pressures often low normal, especially after exercise.  Will hold diuretic therapy Dyslipidemia.  Continue statin therapy History colonic polyps.  Status post recent colonoscopy.  Was given a 10 year interval BPH with history of elevated PSA. Followup urology  Recheck 1 year

## 2013-06-05 NOTE — Patient Instructions (Addendum)
Limit your sodium (Salt) intake    It is important that you exercise regularly, at least 20 minutes 3 to 4 times per week.  If you develop chest pain or shortness of breath seek  medical attention.  You need to lose weight.  Consider a lower calorie diet and regular exercise.  Followup endocrinology and urology and ophthalmology   Return in one year for follow-up  Discontinue indapamideHealth Maintenance, Males A healthy lifestyle and preventative care can promote health and wellness.  Maintain regular health, dental, and eye exams.  Eat a healthy diet. Foods like vegetables, fruits, whole grains, low-fat dairy products, and lean protein foods contain the nutrients you need and are low in calories. Decrease your intake of foods high in solid fats, added sugars, and salt. Get information about a proper diet from your health care provider, if necessary.  Regular physical exercise is one of the most important things you can do for your health. Most adults should get at least 150 minutes of moderate-intensity exercise (any activity that increases your heart rate and causes you to sweat) each week. In addition, most adults need muscle-strengthening exercises on 2 or more days a week.   Maintain a healthy weight. The body mass index (BMI) is a screening tool to identify possible weight problems. It provides an estimate of body fat based on height and weight. Your health care provider can find your BMI and can help you achieve or maintain a healthy weight. For males 20 years and older:  A BMI below 18.5 is considered underweight.  A BMI of 18.5 to 24.9 is normal.  A BMI of 25 to 29.9 is considered overweight.  A BMI of 30 and above is considered obese.  Maintain normal blood lipids and cholesterol by exercising and minimizing your intake of saturated fat. Eat a balanced diet with plenty of fruits and vegetables. Blood tests for lipids and cholesterol should begin at age 67 and be repeated  every 5 years. If your lipid or cholesterol levels are high, you are over 50, or you are at high risk for heart disease, you may need your cholesterol levels checked more frequently.Ongoing high lipid and cholesterol levels should be treated with medicines, if diet and exercise are not working.  If you smoke, find out from your health care provider how to quit. If you do not use tobacco, do not start.  Lung cancer screening is recommended for adults aged 5 80 years who are at high risk for developing lung cancer because of a history of smoking. A yearly low-dose CT scan of the lungs is recommended for people who have at least a 30-pack-year history of smoking and are a current smoker or have quit within the past 15 years. A pack year of smoking is smoking an average of 1 pack of cigarettes a day for 1 year (for example, a 30-pack-year history of smoking could mean smoking 1 pack a day for 30 years or 2 packs a day for 15 years). Yearly screening should continue until the smoker has stopped smoking for at least 15 years. Yearly screening should be stopped for people who develop a health problem that would prevent them from having lung cancer treatment.  If you choose to drink alcohol, do not have more than 2 drinks per day. One drink is considered to be 12 oz (360 mL) of beer, 5 oz (150 mL) of wine, or 1.5 oz (45 mL) of liquor.  Avoid use of street drugs. Do not  share needles with anyone. Ask for help if you need support or instructions about stopping the use of drugs.  High blood pressure causes heart disease and increases the risk of stroke. Blood pressure should be checked at least every 1 2 years. Ongoing high blood pressure should be treated with medicines if weight loss and exercise are not effective.  If you are 73 66 years old, ask your health care provider if you should take aspirin to prevent heart disease.  Diabetes screening involves taking a blood sample to check your fasting blood sugar  level. This should be done once every 3 years after age 29, if you are at a normal weight and without risk factors for diabetes. Testing should be considered at a younger age or be carried out more frequently if you are overweight and have at least 1 risk factor for diabetes.  Colorectal cancer can be detected and often prevented. Most routine colorectal cancer screening begins at the age of 18 and continues through age 50. However, your health care provider may recommend screening at an earlier age if you have risk factors for colon cancer. On a yearly basis, your health care provider may provide home test kits to check for hidden blood in the stool. A small camera at the end of a tube may be used to directly examine the colon (sigmoidoscopy or colonoscopy) to detect the earliest forms of colorectal cancer. Talk to your health care provider about this at age 69, when routine screening begins. A direct exam of the colon should be repeated every 5 10 years through age 53, unless early forms of pre-cancerous polyps or small growths are found.  People who are at an increased risk for hepatitis B should be screened for this virus. You are considered at high risk for hepatitis B if:  You were born in a country where hepatitis B occurs often. Talk with your health care provider about which countries are considered high-risk.  Your parents were born in a high-risk country and you have not received a shot to protect against hepatitis B (hepatitis B vaccine).  You have HIV or AIDS.  You use needles to inject street drugs.  You live with, or have sex with, someone who has hepatitis B.  You are a man who has sex with other men (MSM).  You get hemodialysis treatment.  You take certain medicines for conditions like cancer, organ transplantation, and autoimmune conditions.  Hepatitis C blood testing is recommended for all people born from [birth year] through 1963 and any individual with known risk factors for  hepatitis C.  Healthy men should no longer receive prostate-specific antigen (PSA) blood tests as part of routine cancer screening. Talk to your health care provider about prostate cancer screening.  Testicular cancer screening is not recommended for adolescents or adult males who have no symptoms. Screening includes self-exam, a health care provider exam, and other screening tests. Consult with your health care provider about any symptoms you have or any concerns you have about testicular cancer.  Practice safe sex. Use condoms and avoid high-risk sexual practices to reduce the spread of sexually transmitted infections (STIs).  Use sunscreen. Apply sunscreen liberally and repeatedly throughout the day. You should seek shade when your shadow is shorter than you. Protect yourself by wearing long sleeves, pants, a wide-brimmed hat, and sunglasses year round, whenever you are outdoors.  Tell your health care provider of new moles or changes in moles, especially if there is a change in shape  or color. Also tell your provider if a mole is larger than the size of a pencil eraser.  A one-time screening for abdominal aortic aneurysm (AAA) and surgical repair of large AAAs by ultrasound is recommended for men aged 66 75 years who are current or former smokers.  Stay current with your vaccines (immunizations). Document Released: 06/25/2007 Document Revised: 10/17/2012 Document Reviewed: 05/24/2010 [facility] Patient Information 2014 [place], [place].

## 2013-06-05 NOTE — Progress Notes (Signed)
Pre-visit discussion using our clinic review tool. No additional management support is needed unless otherwise documented below in the visit note.  

## 2013-06-06 ENCOUNTER — Telehealth: Payer: Self-pay

## 2013-06-06 ENCOUNTER — Telehealth: Payer: Self-pay | Admitting: Internal Medicine

## 2013-06-06 NOTE — Telephone Encounter (Signed)
Relevant patient education assigned to patient using Emmi. ° °

## 2013-06-19 ENCOUNTER — Encounter: Payer: Self-pay | Admitting: Internal Medicine

## 2013-06-19 ENCOUNTER — Ambulatory Visit (INDEPENDENT_AMBULATORY_CARE_PROVIDER_SITE_OTHER): Payer: Medicare Other | Admitting: Internal Medicine

## 2013-06-19 VITALS — BP 102/64 | HR 70 | Temp 98.4°F | Wt 174.0 lb

## 2013-06-19 DIAGNOSIS — J069 Acute upper respiratory infection, unspecified: Secondary | ICD-10-CM

## 2013-06-19 DIAGNOSIS — B9789 Other viral agents as the cause of diseases classified elsewhere: Principal | ICD-10-CM

## 2013-06-19 DIAGNOSIS — I10 Essential (primary) hypertension: Secondary | ICD-10-CM

## 2013-06-19 MED ORDER — HYDROCODONE-HOMATROPINE 5-1.5 MG/5ML PO SYRP: 5.0000 mL | ORAL_SOLUTION | Freq: Four times a day (QID) | ORAL | Status: DC | PRN

## 2013-06-19 NOTE — Progress Notes (Signed)
Subjective:    Patient ID: Kenneth Little, male    DOB: [birth year], 68 y.o.   MRN: [id number]  HPI 68 year old patient who is seen today with a chief complaint of cough.  This has been present now for 10 days.  Multiple family members, including 2 grandchildren have had a URI with cough and have improved.  He remains very active in basketball twice weekly and in general, feels quite well.  Cough is interfering with sleep.  Cough does seem to be improving and is nonproductive.  He has been using dextromethorphan  He has treated hypertension, which has been stable  Past Medical History  Diagnosis Date  . Hypertension   . Hyperlipidemia   . UTI (lower urinary tract infection)   . Diabetes mellitus without complication     History   Social History  . Marital Status: Married    Spouse Name: N/A    Number of Children: N/A  . Years of Education: N/A   Occupational History  . Not on file.   Social History Main Topics  . Smoking status: Former Smoker    Types: Cigarettes    Quit date: 11/20/1988  . Smokeless tobacco: Never Used  . Alcohol Use: No  . Drug Use: No  . Sexual Activity: Yes   Other Topics Concern  . Not on file   Social History Narrative  . No narrative on file    Past Surgical History  Procedure Laterality Date  . Hemorrhoid surgery  03/20/12    internal  . Diverticulitis    . Abdominal surgery    . Hernia repair    . Ercp N/A 12/23/2012    Procedure: ENDOSCOPIC RETROGRADE CHOLANGIOPANCREATOGRAPHY (ERCP);  Surgeon: Jeryl Columbia, MD;  Location: [facility];  Service: Endoscopy;  Laterality: N/A;  . Cholecystectomy N/A 12/24/2012    Procedure: LAPAROSCOPIC CHOLECYSTECTOMY WITH INTRAOPERATIVE CHOLANGIOGRAM;  Surgeon: Shann Medal, MD;  Location: [facility];  Service: General;  Laterality: N/A;    Family History  Problem Relation Age of Onset  . Cancer Mother     breast  . Hypertension Father   . Diabetes Brother     No Known Allergies  Current Outpatient  Prescriptions on File Prior to Visit  Medication Sig Dispense Refill  . aspirin EC 81 MG tablet Take 81 mg by mouth every morning.       Marland Kitchen atorvastatin (LIPITOR) 40 MG tablet Take 40 mg by mouth every evening.       . calcium-vitamin D (OSCAL WITH D) 500-200 MG-UNIT per tablet Take 1 tablet by mouth every morning.      . ergocalciferol (VITAMIN D2) 50000 UNITS capsule Take 50,000 Units by mouth once a week. Thursday      . insulin glargine (LANTUS) 100 UNIT/ML injection Inject 25 Units into the skin at bedtime.       . INVOKANA 300 MG TABS Take 1 tablet by mouth every morning.       . metFORMIN (GLUCOPHAGE) 1000 MG tablet Take 1,000 mg by mouth 2 (two) times daily with a meal.      . ONE TOUCH ULTRA TEST test strip 1 each by Other route daily as needed.       . pioglitazone (ACTOS) 45 MG tablet Take 45 mg by mouth every morning.       . sitaGLIPtin (JANUVIA) 100 MG tablet Take 100 mg by mouth every morning.       . valsartan (DIOVAN) 320 MG tablet Take  320 mg by mouth every morning.        No current facility-administered medications on file prior to visit.    BP 102/64  Pulse 70  Temp(Src) 98.4 F (36.9 C) (Oral)  Wt 174 lb (78.926 kg)  SpO2 92%      Review of Systems  Constitutional: Negative for fever, chills, appetite change and fatigue.  HENT: Negative for congestion, dental problem, ear pain, hearing loss, sore throat, tinnitus, trouble swallowing and voice change.   Eyes: Negative for pain, discharge and visual disturbance.  Respiratory: Positive for cough. Negative for chest tightness, wheezing and stridor.   Cardiovascular: Negative for chest pain, palpitations and leg swelling.  Gastrointestinal: Negative for nausea, vomiting, abdominal pain, diarrhea, constipation, blood in stool and abdominal distention.  Genitourinary: Negative for urgency, hematuria, flank pain, discharge, difficulty urinating and genital sores.  Musculoskeletal: Negative for arthralgias, back pain,  gait problem, joint swelling, myalgias and neck stiffness.  Skin: Negative for rash.  Neurological: Negative for dizziness, syncope, speech difficulty, weakness, numbness and headaches.  Hematological: Negative for adenopathy. Does not bruise/bleed easily.  Psychiatric/Behavioral: Negative for behavioral problems and dysphoric mood. The patient is not nervous/anxious.        Objective:   Physical Exam  Constitutional: He is oriented to person, place, and time. He appears well-developed.  HENT:  Head: Normocephalic.  Right Ear: External ear normal.  Left Ear: External ear normal.  Eyes: Conjunctivae and EOM are normal.  Neck: Normal range of motion.  Cardiovascular: Normal rate and normal heart sounds.   Pulmonary/Chest: Effort normal and breath sounds normal. No respiratory distress. He has no wheezes. He has no rales.  Abdominal: Bowel sounds are normal.  Musculoskeletal: Normal range of motion. He exhibits no edema and no tenderness.  Neurological: He is alert and oriented to person, place, and time.  Psychiatric: He has a normal mood and affect. His behavior is normal.          Assessment & Plan:   Viral. URI with cough.  Will treat symptomatically Hypertension stable

## 2013-06-19 NOTE — Progress Notes (Signed)
Pre visit review using our clinic review tool, if applicable. No additional management support is needed unless otherwise documented below in the visit note. 

## 2013-06-19 NOTE — Patient Instructions (Signed)
Acute bronchitis symptoms for less than 10 days are generally not helped by antibiotics.  Take over-the-counter expectorants and cough medications such as  Mucinex DM.  Call if there is no improvement in 5 to 7 days or if  you develop worsening cough, fever, or new symptoms, such as shortness of breath or chest pain.   

## 2013-07-26 ENCOUNTER — Ambulatory Visit (INDEPENDENT_AMBULATORY_CARE_PROVIDER_SITE_OTHER): Payer: Medicare Other | Admitting: Internal Medicine

## 2013-07-26 ENCOUNTER — Encounter: Payer: Self-pay | Admitting: Internal Medicine

## 2013-07-26 VITALS — BP 110/62 | HR 60 | Temp 97.9°F | Resp 20 | Ht 66.0 in | Wt 175.0 lb

## 2013-07-26 DIAGNOSIS — E119 Type 2 diabetes mellitus without complications: Secondary | ICD-10-CM

## 2013-07-26 DIAGNOSIS — B9789 Other viral agents as the cause of diseases classified elsewhere: Secondary | ICD-10-CM

## 2013-07-26 DIAGNOSIS — E1165 Type 2 diabetes mellitus with hyperglycemia: Secondary | ICD-10-CM

## 2013-07-26 DIAGNOSIS — J069 Acute upper respiratory infection, unspecified: Secondary | ICD-10-CM

## 2013-07-26 NOTE — Progress Notes (Signed)
Subjective:    Patient ID: Kenneth Little, male    DOB: 10-10-1945, 68 y.o.   MRN: [id number]  HPI  68 year old patient who presents today with a chief complaint of persistent cough.  He in multiple family members developed a viral URI with cough approximately 6 weeks ago.  He has persistent nonproductive cough, which also persists in several family members.  He describes a tickling and foreign body sensation in the throat that induces coughing.  Remains very active without any dyspnea on exertion.  He remains active with basketball.  No fever or other constitutional complaints.  Otherwise, he feels quite well.  He has been using Delsym with some benefit  Past Medical History  Diagnosis Date  . Hypertension   . Hyperlipidemia   . UTI (lower urinary tract infection)   . Diabetes mellitus without complication     History   Social History  . Marital Status: Married    Spouse Name: N/A    Number of Children: N/A  . Years of Education: N/A   Occupational History  . Not on file.   Social History Main Topics  . Smoking status: Former Smoker    Types: Cigarettes    Quit date: 11/20/1988  . Smokeless tobacco: Never Used  . Alcohol Use: No  . Drug Use: No  . Sexual Activity: Yes   Other Topics Concern  . Not on file   Social History Narrative  . No narrative on file    Past Surgical History  Procedure Laterality Date  . Hemorrhoid surgery  03/20/12    internal  . Diverticulitis    . Abdominal surgery    . Hernia repair    . Ercp N/A 12/23/2012    Procedure: ENDOSCOPIC RETROGRADE CHOLANGIOPANCREATOGRAPHY (ERCP);  Surgeon: Jeryl Columbia, MD;  Location: [facility];  Service: Endoscopy;  Laterality: N/A;  . Cholecystectomy N/A 12/24/2012    Procedure: LAPAROSCOPIC CHOLECYSTECTOMY WITH INTRAOPERATIVE CHOLANGIOGRAM;  Surgeon: Shann Medal, MD;  Location: [facility];  Service: General;  Laterality: N/A;    Family History  Problem Relation Age of Onset  . Cancer Mother     breast  .  Hypertension Father   . Diabetes Brother     No Known Allergies  Current Outpatient Prescriptions on File Prior to Visit  Medication Sig Dispense Refill  . aspirin EC 81 MG tablet Take 81 mg by mouth every morning.       Marland Kitchen atorvastatin (LIPITOR) 40 MG tablet Take 40 mg by mouth every evening.       . calcium-vitamin D (OSCAL WITH D) 500-200 MG-UNIT per tablet Take 1 tablet by mouth every morning.      . ergocalciferol (VITAMIN D2) 50000 UNITS capsule Take 50,000 Units by mouth once a week. Thursday      . HYDROcodone-homatropine (HYCODAN) 5-1.5 MG/5ML syrup Take 5 mLs by mouth every 6 (six) hours as needed for cough.  120 mL  0  . insulin glargine (LANTUS) 100 UNIT/ML injection Inject 25 Units into the skin at bedtime.       . INVOKANA 300 MG TABS Take 1 tablet by mouth every morning.       . metFORMIN (GLUCOPHAGE) 1000 MG tablet Take 1,000 mg by mouth 2 (two) times daily with a meal.      . ONE TOUCH ULTRA TEST test strip 1 each by Other route daily as needed.       . pioglitazone (ACTOS) 45 MG tablet Take 45 mg  by mouth every morning.       . sitaGLIPtin (JANUVIA) 100 MG tablet Take 100 mg by mouth every morning.       . valsartan (DIOVAN) 320 MG tablet Take 320 mg by mouth every morning.        No current facility-administered medications on file prior to visit.    BP 110/62  Pulse 60  Temp(Src) 97.9 F (36.6 C) (Oral)  Resp 20  Ht 5\' 6"  (1.676 m)  Wt 175 lb (79.379 kg)  BMI 28.26 kg/m2  SpO2 96%      Review of Systems  Constitutional: Negative for fever, chills, appetite change and fatigue.  HENT: Negative for congestion, dental problem, ear pain, hearing loss, sore throat, tinnitus, trouble swallowing and voice change.   Eyes: Negative for pain, discharge and visual disturbance.  Respiratory: Positive for cough. Negative for chest tightness, wheezing and stridor.   Cardiovascular: Negative for chest pain, palpitations and leg swelling.  Gastrointestinal: Negative for  nausea, vomiting, abdominal pain, diarrhea, constipation, blood in stool and abdominal distention.  Genitourinary: Negative for urgency, hematuria, flank pain, discharge, difficulty urinating and genital sores.  Musculoskeletal: Negative for arthralgias, back pain, gait problem, joint swelling, myalgias and neck stiffness.  Skin: Negative for rash.  Neurological: Negative for dizziness, syncope, speech difficulty, weakness, numbness and headaches.  Hematological: Negative for adenopathy. Does not bruise/bleed easily.  Psychiatric/Behavioral: Negative for behavioral problems and dysphoric mood. The patient is not nervous/anxious.        Objective:   Physical Exam  Constitutional: He is oriented to person, place, and time. He appears well-developed.  HENT:  Head: Normocephalic.  Right Ear: External ear normal.  Left Ear: External ear normal.  Eyes: Conjunctivae and EOM are normal.  Neck: Normal range of motion.  Cardiovascular: Normal rate and normal heart sounds.   Pulmonary/Chest: Breath sounds normal.  Abdominal: Bowel sounds are normal.  Musculoskeletal: Normal range of motion. He exhibits no edema and no tenderness.  Neurological: He is alert and oriented to person, place, and time.  Psychiatric: He has a normal mood and affect. His behavior is normal.          Assessment & Plan:   Persistent post viral URI, cough.  Patient reassured Diabetes stable

## 2013-07-26 NOTE — Progress Notes (Signed)
Pre visit review using our clinic review tool, if applicable. No additional management support is needed unless otherwise documented below in the visit note. 

## 2013-07-26 NOTE — Patient Instructions (Signed)
Call or return to clinic prn if these symptoms worsen or fail to improve as anticipated. Cough, Adult  A cough is a reflex that helps clear your throat and airways. It can help heal the body or may be a reaction to an irritated airway. A cough may only last 2 or 3 weeks (acute) or may last more than 8 weeks (chronic).  CAUSES Acute cough:  Viral or bacterial infections. Chronic cough:  Infections.  Allergies.  Asthma.  Post-nasal drip.  Smoking.  Heartburn or acid reflux.  Some medicines.  Chronic lung problems (COPD).  Cancer. SYMPTOMS   Cough.  Fever.  Chest pain.  Increased breathing rate.  High-pitched whistling sound when breathing (wheezing).  Colored mucus that you cough up (sputum). TREATMENT   A bacterial cough may be treated with antibiotic medicine.  A viral cough must run its course and will not respond to antibiotics.  Your caregiver may recommend other treatments if you have a chronic cough. HOME CARE INSTRUCTIONS   Only take over-the-counter or prescription medicines for pain, discomfort, or fever as directed by your caregiver. Use cough suppressants only as directed by your caregiver.  Use a cold steam vaporizer or humidifier in your bedroom or home to help loosen secretions.  Sleep in a semi-upright position if your cough is worse at night.  Rest as needed.  Stop smoking if you smoke. SEEK IMMEDIATE MEDICAL CARE IF:   You have pus in your sputum.  Your cough starts to worsen.  You cannot control your cough with suppressants and are losing sleep.  You begin coughing up blood.  You have difficulty breathing.  You develop pain which is getting worse or is uncontrolled with medicine.  You have a fever. MAKE SURE YOU:   Understand these instructions.  Will watch your condition.  Will get help right away if you are not doing well or get worse. Document Released: 06/25/2010 Document Revised: 03/21/2011 Document Reviewed:  06/25/2010 [facility] Patient Information 2015 [place], [place]. This information is not intended to replace advice given to you by your health care provider. Make sure you discuss any questions you have with your health care provider.

## 2013-12-26 ENCOUNTER — Ambulatory Visit (INDEPENDENT_AMBULATORY_CARE_PROVIDER_SITE_OTHER): Payer: Medicare Other | Admitting: *Deleted

## 2013-12-26 DIAGNOSIS — Z23 Encounter for immunization: Secondary | ICD-10-CM

## 2014-01-12 IMAGING — CT CT ANGIO CHEST
1 of 2 series · 19 of 32 positions shown · IV contrast (OMNIPAQUE 300)
Comparison: None.

CLINICAL DATA: Postoperative fever and hypoxia.

CT ANGIOGRAPHY CHEST
TECHNIQUE: Multidetector CT imaging of the chest using the
standard protocol during bolus administration of intravenous
contrast. Multiplanar reconstructed images including MIPs were
obtained and reviewed to evaluate the vascular anatomy.
Contrast: 100mL OMNIPAQUE IOHEXOL 350 MG/ML SOLN

[Series 5: thins for pacs · axial · 0.73mm/px · z∈[-283,-53]mm · 19 of 252 slices shown]
[im 11/252  lung]
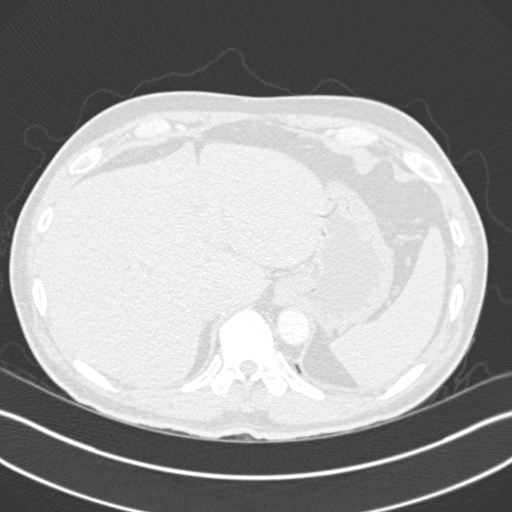
[im 22/252  soft-tissue]
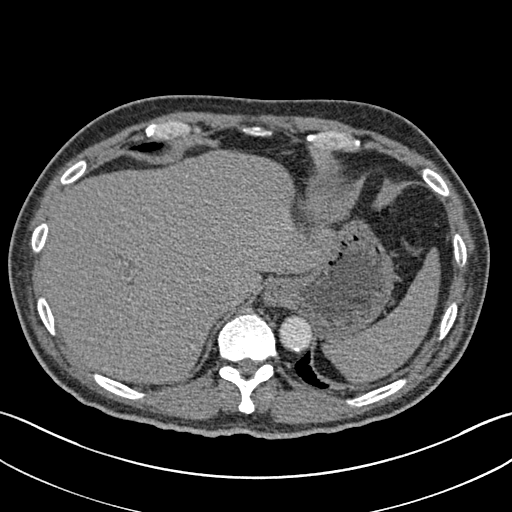
[im 33/252  lung]
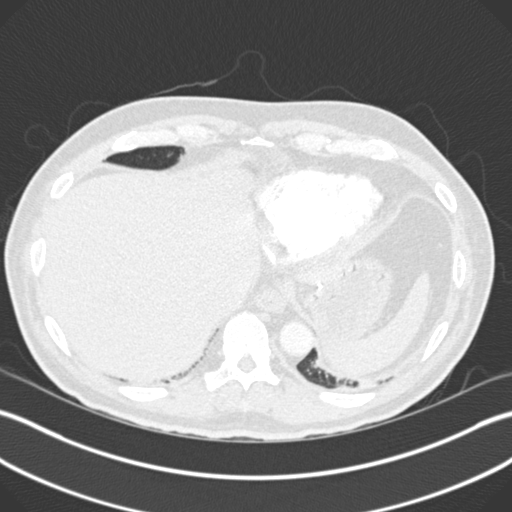
[im 55/252  soft-tissue]
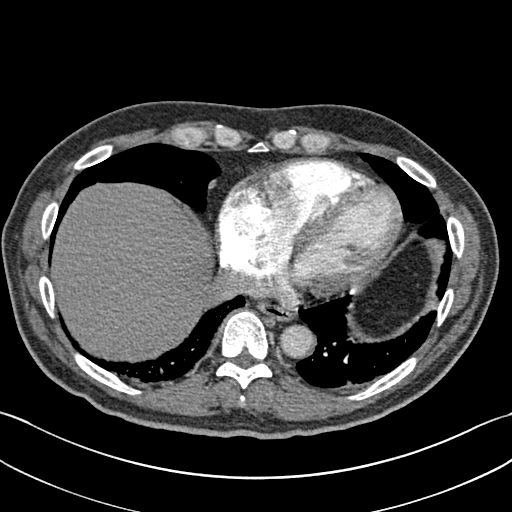
[im 66/252  lung]
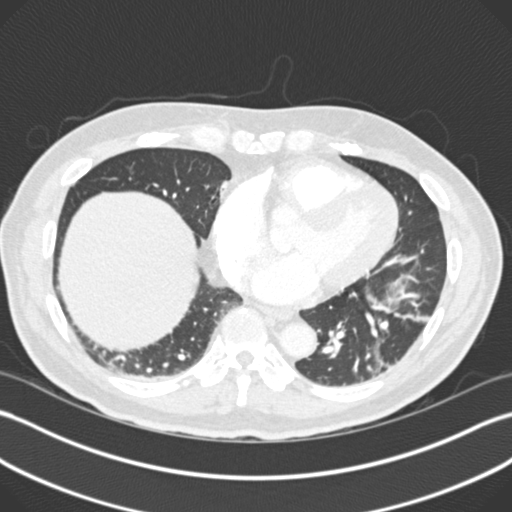
[im 77/252  soft-tissue]
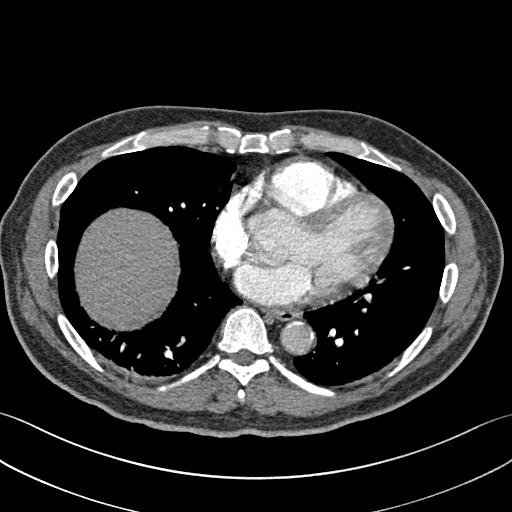
[im 88/252  lung]
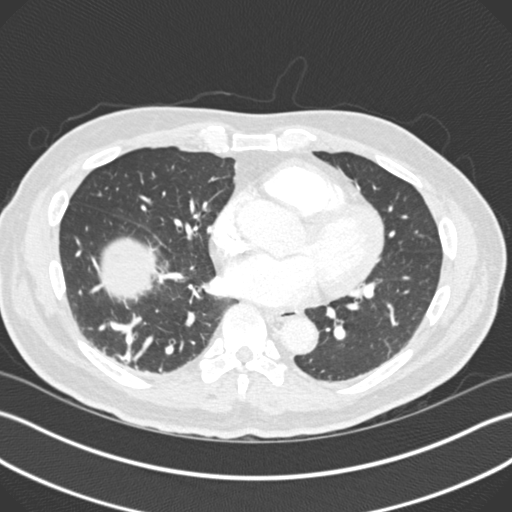
[im 99/252  soft-tissue]
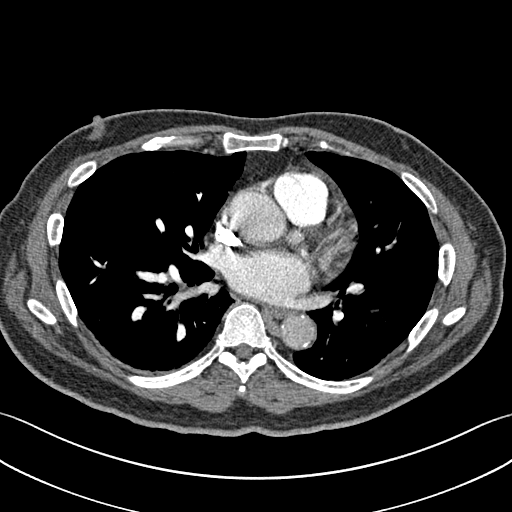
[im 110/252  lung]
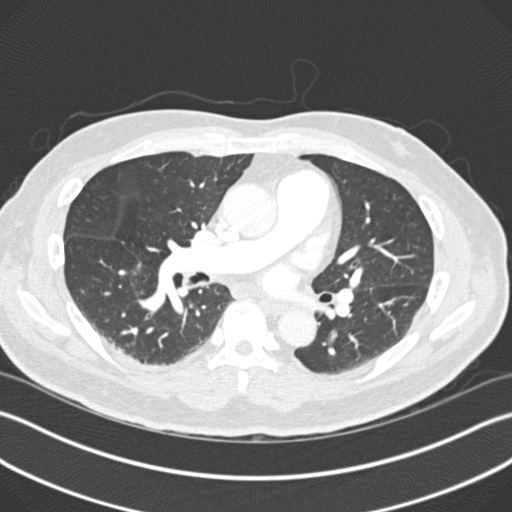
[im 131/252  soft-tissue]
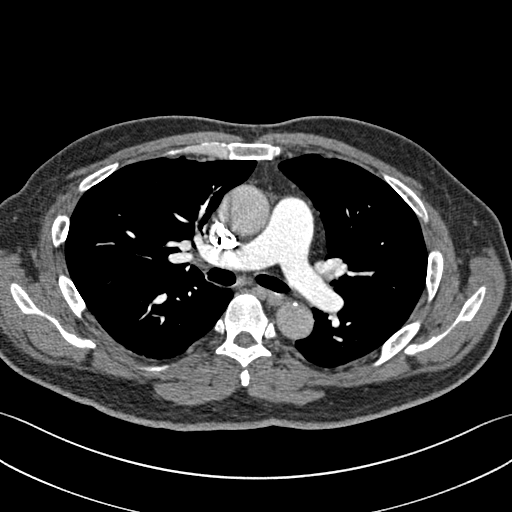
[im 142/252  lung]
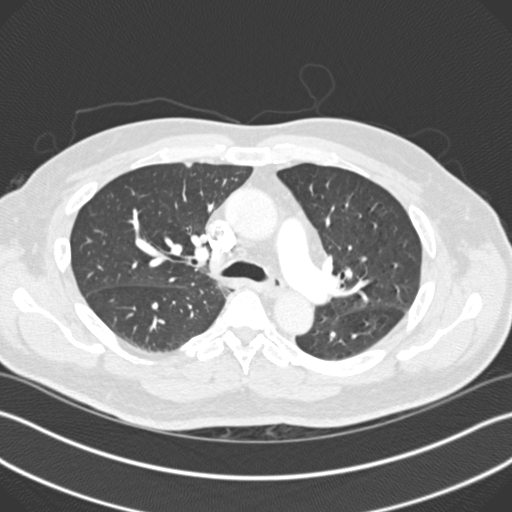
[im 153/252  soft-tissue]
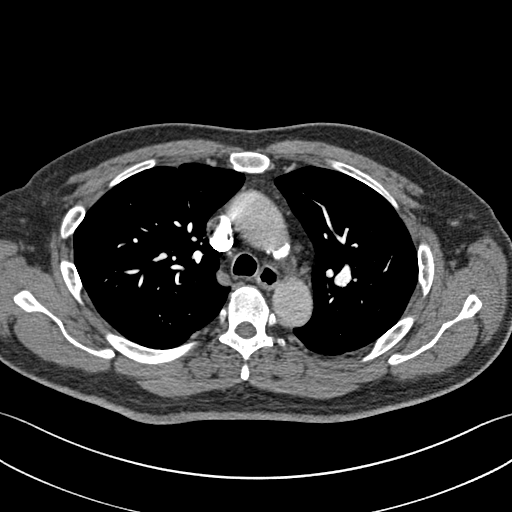
[im 164/252  lung]
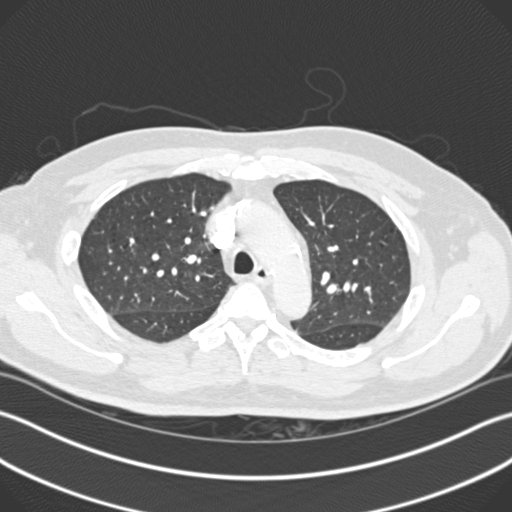
[im 175/252  soft-tissue]
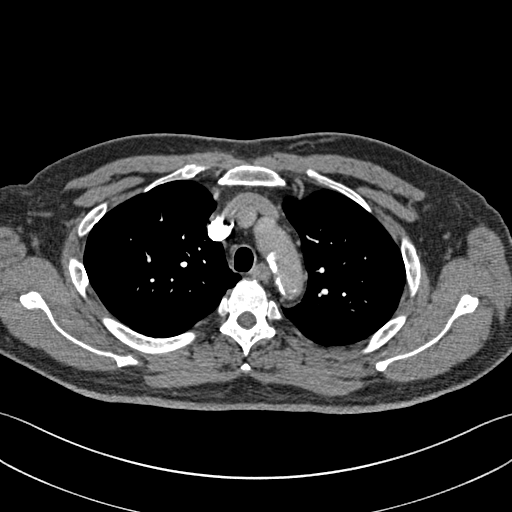
[im 186/252  lung]
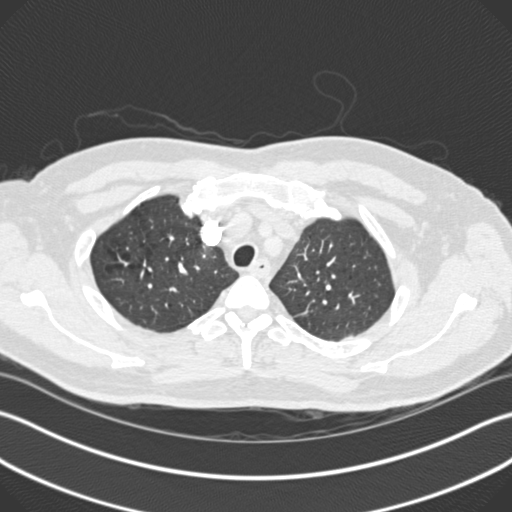
[im 197/252  soft-tissue]
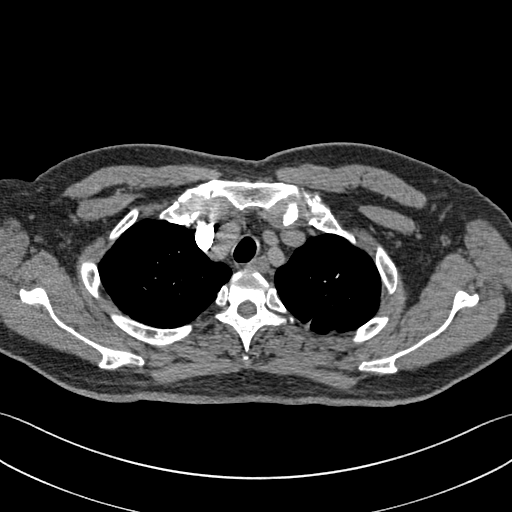
[im 219/252  lung]
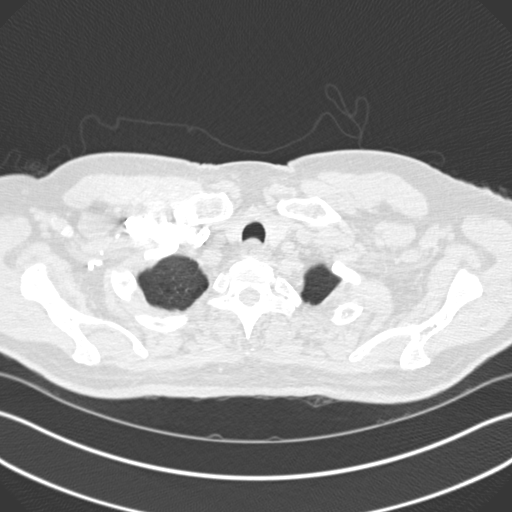
[im 230/252  soft-tissue]
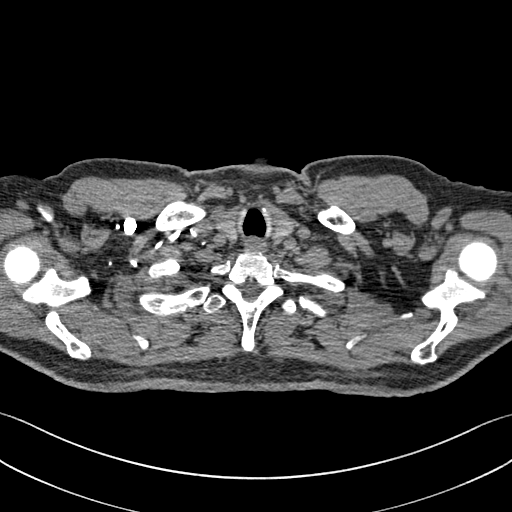
[im 241/252  lung]
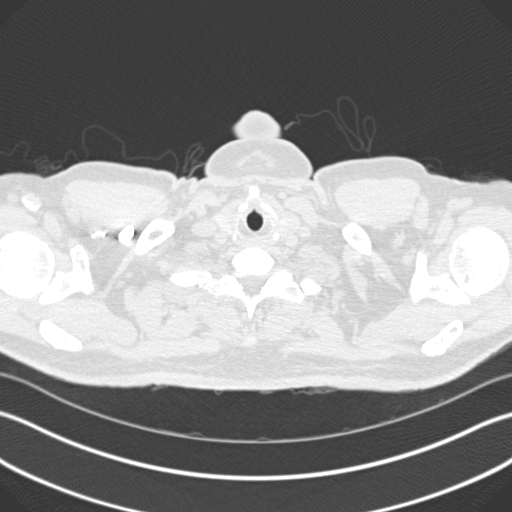

[19 of 32 positions shown; findings below may reference images not displayed]

FINDINGS: Technically adequate study with good opacification of the
central and segmental pulmonary arteries.  No focal filling defect.
No evidence of significant pulmonary embolus.

Normal heart size.  Normal caliber thoracic aorta with
calcification.  No significant lymphadenopathy in the chest.  No
abnormal mediastinal fluid collections.  The esophagus is mostly
decompressed.  Visualized portions of the upper abdominal organs
are grossly unremarkable.  Mild atelectasis in the lung bases.
Focal linear atelectasis in the right mid lung.  No pneumothorax.
Mild emphysematous changes in the upper lungs.  No pleural
effusions.  Normal alignment of the thoracic vertebrae.
IMPRESSION: No evidence of significant pulmonary embolus.  Mild atelectasis in
the lungs.

## 2014-01-23 ENCOUNTER — Encounter (HOSPITAL_COMMUNITY): Payer: Self-pay | Admitting: Gastroenterology

## 2014-06-20 ENCOUNTER — Other Ambulatory Visit (INDEPENDENT_AMBULATORY_CARE_PROVIDER_SITE_OTHER): Payer: Medicare Other

## 2014-06-20 ENCOUNTER — Other Ambulatory Visit: Payer: Self-pay

## 2014-06-20 DIAGNOSIS — E785 Hyperlipidemia, unspecified: Secondary | ICD-10-CM

## 2014-06-20 DIAGNOSIS — Z125 Encounter for screening for malignant neoplasm of prostate: Secondary | ICD-10-CM | POA: Diagnosis not present

## 2014-06-20 DIAGNOSIS — I10 Essential (primary) hypertension: Secondary | ICD-10-CM

## 2014-06-20 DIAGNOSIS — Z0000 Encounter for general adult medical examination without abnormal findings: Secondary | ICD-10-CM

## 2014-06-20 LAB — BASIC METABOLIC PANEL
BUN: 19 mg/dL (ref 6–23)
CALCIUM: 9.1 mg/dL (ref 8.4–10.5)
CO2: 29 mEq/L (ref 19–32)
CREATININE: 0.87 mg/dL (ref 0.40–1.50)
Chloride: 105 mEq/L (ref 96–112)
GFR: 93.15 mL/min (ref >60.00)
Glucose, Bld: 104 mg/dL — ABNORMAL HIGH (ref 70–99)
Potassium: 3.7 mEq/L (ref 3.5–5.1)
SODIUM: 140 meq/L (ref 135–145)

## 2014-06-20 LAB — POCT URINALYSIS DIPSTICK
Bilirubin, UA: NEGATIVE
Ketones, UA: NEGATIVE
LEUKOCYTES UA: NEGATIVE
Nitrite, UA: NEGATIVE
PROTEIN UA: NEGATIVE
RBC UA: NEGATIVE
SPEC GRAV UA: 1.02
Urobilinogen, UA: 0.2
pH, UA: 6

## 2014-06-20 LAB — LIPID PANEL
Cholesterol: 92 mg/dL (ref 0–200)
HDL: 41.5 mg/dL (ref >39.00)
LDL Cholesterol: 37 mg/dL (ref 0–99)
NONHDL: 50.5
Total CHOL/HDL Ratio: 2
Triglycerides: 68 mg/dL (ref 0.0–149.0)
VLDL: 13.6 mg/dL (ref 0.0–40.0)

## 2014-06-20 LAB — TSH: TSH: 2.07 u[IU]/mL (ref 0.35–4.50)

## 2014-06-20 LAB — CBC WITH DIFFERENTIAL/PLATELET
BASOS ABS: 0 10*3/uL (ref 0.0–0.1)
Basophils Relative: 0.5 % (ref 0.0–3.0)
EOS ABS: 0.3 10*3/uL (ref 0.0–0.7)
Eosinophils Relative: 3.7 % (ref 0.0–5.0)
HCT: 46.8 % (ref 39.0–52.0)
HEMOGLOBIN: 15.7 g/dL (ref 13.0–17.0)
LYMPHS ABS: 2.5 10*3/uL (ref 0.7–4.0)
Lymphocytes Relative: 34.3 % (ref 12.0–46.0)
MCHC: 33.4 g/dL (ref 30.0–36.0)
MCV: 92.8 fl (ref 78.0–100.0)
Monocytes Absolute: 0.6 10*3/uL (ref 0.1–1.0)
Monocytes Relative: 8.8 % (ref 3.0–12.0)
NEUTROS PCT: 52.7 % (ref 43.0–77.0)
Neutro Abs: 3.8 10*3/uL (ref 1.4–7.7)
PLATELETS: 192 10*3/uL (ref 150.0–400.0)
RBC: 5.05 Mil/uL (ref 4.22–5.81)
RDW: 14 % (ref 11.5–15.5)
WBC: 7.3 10*3/uL (ref 4.0–10.5)

## 2014-06-20 LAB — MICROALBUMIN / CREATININE URINE RATIO
Creatinine,U: 130.3 mg/dL
MICROALB/CREAT RATIO: 1.1 mg/g (ref 0.0–30.0)
Microalb, Ur: 1.4 mg/dL (ref 0.0–1.9)

## 2014-06-20 LAB — HEPATIC FUNCTION PANEL
ALT: 21 U/L (ref 0–53)
AST: 20 U/L (ref 0–37)
Albumin: 3.9 g/dL (ref 3.5–5.2)
Alkaline Phosphatase: 55 U/L (ref 39–117)
Bilirubin, Direct: 0.3 mg/dL (ref 0.0–0.3)
TOTAL PROTEIN: 6.2 g/dL (ref 6.0–8.3)
Total Bilirubin: 1.1 mg/dL (ref 0.2–1.2)

## 2014-06-20 LAB — HEMOGLOBIN A1C: HEMOGLOBIN A1C: 6.7 % — AB (ref 4.6–6.5)

## 2014-06-20 LAB — PSA: PSA: 7.85 ng/mL — AB (ref 0.10–4.00)

## 2014-06-27 ENCOUNTER — Encounter: Payer: Self-pay | Admitting: Internal Medicine

## 2014-06-27 ENCOUNTER — Ambulatory Visit (INDEPENDENT_AMBULATORY_CARE_PROVIDER_SITE_OTHER): Payer: Medicare Other | Admitting: Internal Medicine

## 2014-06-27 VITALS — BP 128/72 | HR 73 | Temp 98.4°F | Resp 20 | Ht 66.5 in | Wt 175.0 lb

## 2014-06-27 DIAGNOSIS — E7800 Pure hypercholesterolemia, unspecified: Secondary | ICD-10-CM

## 2014-06-27 DIAGNOSIS — I10 Essential (primary) hypertension: Secondary | ICD-10-CM

## 2014-06-27 DIAGNOSIS — Z86010 Personal history of colonic polyps: Secondary | ICD-10-CM

## 2014-06-27 DIAGNOSIS — E119 Type 2 diabetes mellitus without complications: Secondary | ICD-10-CM

## 2014-06-27 DIAGNOSIS — Z0000 Encounter for general adult medical examination without abnormal findings: Secondary | ICD-10-CM | POA: Diagnosis not present

## 2014-06-27 NOTE — Progress Notes (Signed)
Subjective:    Patient ID: Kenneth Little, male    DOB: 1945/05/17, 69 y.o.   MRN: [id number]  HPI 69 year old patient who is seen today for an annual exam.  He is followed by endocrine for a type 2 diabetes which he has had for at least 15 years. He is on Lantus insulin as well as oral medications. He has treated hypertension and is also on atorvastatin for dyslipidemia. He is seen annually for eye examinations by Marica Otter. His last colonoscopy was recently performed by Dr. Collene Mares.   He has a history of colonic polyps. He's also followed Dr. Jeffie Pollock for BPH and a history of elevated PSA. He has had a prostate biopsy in the past.   Since establishing with the practice, he was admitted in March of 2014 for Escherichia coli sepsis complicating hemorrhoidectomy.  Is also status post laparoscopic cholecystectomy in December of 2014.  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  He  Retired  in June 2015;  Nonsmoker. He does exercise regularly including full court [facility] [employer] twice weekly  Past Medical History  Diagnosis Date  . Hypertension   . Hyperlipidemia   . UTI (lower urinary tract infection)   . Diabetes mellitus without complication     History   Social History  . Marital Status: Married    Spouse Name: N/A  . Number of Children: N/A  . Years of Education: N/A   Occupational History  . Not on file.   Social History Main Topics  . Smoking status: Former Smoker    Types: Cigarettes    Quit date: 11/20/1988  . Smokeless tobacco: Never Used  . Alcohol Use: No  . Drug Use: No  . Sexual Activity: Yes   Other Topics Concern  . Not on file   Social History Narrative    Past Surgical History  Procedure Laterality Date  . Hemorrhoid surgery  03/20/12    internal  . Diverticulitis    . Abdominal surgery    .  Hernia repair    . Ercp N/A 12/23/2012    Procedure: ENDOSCOPIC RETROGRADE CHOLANGIOPANCREATOGRAPHY (ERCP);  Surgeon: Jeryl Columbia, MD;  Location: [facility];  Service: Endoscopy;  Laterality: N/A;  . Cholecystectomy N/A 12/24/2012    Procedure: LAPAROSCOPIC CHOLECYSTECTOMY WITH INTRAOPERATIVE CHOLANGIOGRAM;  Surgeon: Shann Medal, MD;  Location: [facility];  Service: General;  Laterality: N/A;    Family History  Problem Relation Age of Onset  . Cancer Mother     breast  . Hypertension Father   . Diabetes Brother     No Known Allergies  Current Outpatient Prescriptions on File Prior to Visit  Medication Sig Dispense Refill  . aspirin EC 81 MG tablet Take 81 mg by mouth every morning.     Marland Kitchen atorvastatin (LIPITOR) 40 MG tablet Take 40 mg by mouth every evening.     . calcium-vitamin D (OSCAL WITH D) 500-200 MG-UNIT per tablet Take 1 tablet by mouth every morning.    . ergocalciferol (VITAMIN D2) 50000 UNITS capsule Take 50,000 Units by mouth once a week. Thursday    . insulin glargine (LANTUS) 100 UNIT/ML injection Inject 25 Units into the skin at bedtime.     . INVOKANA 300 MG TABS Take 1 tablet by mouth every morning.     Marland Kitchen  metFORMIN (GLUCOPHAGE) 1000 MG tablet Take 1,000 mg by mouth 2 (two) times daily with a meal.    . ONE TOUCH ULTRA TEST test strip 1 each by Other route daily as needed.     . pioglitazone (ACTOS) 45 MG tablet Take 45 mg by mouth every morning.     . sitaGLIPtin (JANUVIA) 100 MG tablet Take 100 mg by mouth every morning.     . valsartan (DIOVAN) 320 MG tablet Take 320 mg by mouth every morning.      No current facility-administered medications on file prior to visit.    BP 128/72 mmHg  Pulse 73  Temp(Src) 98.4 F (36.9 C) (Oral)  Resp 20  Ht 5' 6.5" (1.689 m)  Wt 175 lb (79.379 kg)  BMI 27.83 kg/m2  SpO2 96%  1. Risk factors, based on past  M,S,F history-cardiovascular risk factors include hypertension, and diabetes.  He is on statin therapy  2.  Physical  activities: Quite active participates in half court basketball  3.  Depression/mood: No history of depression or mood disorder  4.  Hearing: No deficits  5.  ADL's: Totally independent  6.  Fall risk: Low  7.  Home safety: No problems identified  8.  Height weight, and visual acuity; height and weight stable.  No change in visual acuity eye examination scheduled in August  9.  Counseling: Regular.  Exercise moderate weight loss encouraged  10. Lab orders based on risk factors: Laboratory profile reviewed.  Hemoglobin A1c 6.7  11. Referral : Followup for endocrinology and urology and ophthalmology   12. Care plan: Heart healthy diet, modest weight loss recommended  13. Cognitive assessment: Alert, in order with normal affect.  No cognitive dysfunction  14.  Preventive services will include annual health assessments with screening lab.  He will be seen by ophthalmology annually urology annually.  We'll have colonoscopies performed at 5 years intervals.  Due to his history of colonic polyps.Patient was provided with a written and personalized care plan   15.   Provider list includes primary care urology, ophthalmology and GI          Review of Systems  Constitutional: Negative for fever, chills, activity change, appetite change and fatigue.  HENT: Negative for congestion, dental problem, ear pain, hearing loss, mouth sores, rhinorrhea, sinus pressure, sneezing, tinnitus, trouble swallowing and voice change.   Eyes: Negative for photophobia, pain, redness and visual disturbance.  Respiratory: Negative for apnea, cough, choking, chest tightness, shortness of breath and wheezing.   Cardiovascular: Negative for chest pain, palpitations and leg swelling.  Gastrointestinal: Negative for nausea, vomiting, abdominal pain, diarrhea, constipation, blood in stool, abdominal distention, anal bleeding and rectal pain.  Genitourinary: Negative for dysuria, urgency, frequency, hematuria,  flank pain, decreased urine volume, discharge, penile swelling, scrotal swelling, difficulty urinating, genital sores and testicular pain.  Musculoskeletal: Negative for myalgias, back pain, joint swelling, arthralgias, gait problem, neck pain and neck stiffness.  Skin: Negative for color change, rash and wound.  Neurological: Negative for dizziness, tremors, seizures, syncope, facial asymmetry, speech difficulty, weakness, light-headedness, numbness and headaches.  Hematological: Negative for adenopathy. Does not bruise/bleed easily.  Psychiatric/Behavioral: Negative for suicidal ideas, hallucinations, behavioral problems, confusion, sleep disturbance, self-injury, dysphoric mood, decreased concentration and agitation. The patient is not nervous/anxious.        Objective:   Physical Exam  Constitutional: He is oriented to person, place, and time. He appears well-developed.  HENT:  Head: Normocephalic.  Right Ear: External ear normal.  Left Ear: External ear normal.  Eyes: Conjunctivae and EOM are normal.  Neck: Normal range of motion.  Cardiovascular: Normal rate and normal heart sounds.   Pulmonary/Chest: Breath sounds normal.  Abdominal: Bowel sounds are normal.  Few scattered lipoma in the abdominal wall region  Musculoskeletal: Normal range of motion. He exhibits no edema or tenderness.  Neurological: He is alert and oriented to person, place, and time.  Psychiatric: He has a normal mood and affect. His behavior is normal.          Assessment & Plan:   Preventive health examination Diabetes mellitus. Followup endocrinology.  Suboptimal control Hypertension stable.  Blood pressures often low normal, especially after exercise.  Will hold diuretic therapy Dyslipidemia.  Continue statin therapy History colonic polyps.  Status post recent colonoscopy.  Was given a 10 year interval BPH with history of elevated PSA. Followup urology  Recheck 1 year

## 2014-06-27 NOTE — Patient Instructions (Signed)
Limit your sodium (Salt) intake    It is important that you exercise regularly, at least 20 minutes 3 to 4 times per week.  If you develop chest pain or shortness of breath seek  medical attention.   Please check your hemoglobin A1c every 3 months  Return in one year for follow-up  Health Maintenance A healthy lifestyle and preventative care can promote health and wellness.  Maintain regular health, dental, and eye exams.  Eat a healthy diet. Foods like vegetables, fruits, whole grains, low-fat dairy products, and lean protein foods contain the nutrients you need and are low in calories. Decrease your intake of foods high in solid fats, added sugars, and salt. Get information about a proper diet from your health care provider, if necessary.  Regular physical exercise is one of the most important things you can do for your health. Most adults should get at least 150 minutes of moderate-intensity exercise (any activity that increases your heart rate and causes you to sweat) each week. In addition, most adults need muscle-strengthening exercises on 2 or more days a week.   Maintain a healthy weight. The body mass index (BMI) is a screening tool to identify possible weight problems. It provides an estimate of body fat based on height and weight. Your health care provider can find your BMI and can help you achieve or maintain a healthy weight. For males 20 years and older:  A BMI below 18.5 is considered underweight.  A BMI of 18.5 to 24.9 is normal.  A BMI of 25 to 29.9 is considered overweight.  A BMI of 30 and above is considered obese.  Maintain normal blood lipids and cholesterol by exercising and minimizing your intake of saturated fat. Eat a balanced diet with plenty of fruits and vegetables. Blood tests for lipids and cholesterol should begin at age 88 and be repeated every 5 years. If your lipid or cholesterol levels are high, you are over age 64, or you are at high risk for heart  disease, you may need your cholesterol levels checked more frequently.Ongoing high lipid and cholesterol levels should be treated with medicines if diet and exercise are not working.  If you smoke, find out from your health care provider how to quit. If you do not use tobacco, do not start.  Lung cancer screening is recommended for adults aged 22-80 years who are at high risk for developing lung cancer because of a history of smoking. A yearly low-dose CT scan of the lungs is recommended for people who have at least a 30-pack-year history of smoking and are current smokers or have quit within the past 15 years. A pack year of smoking is smoking an average of 1 pack of cigarettes a day for 1 year (for example, a 30-pack-year history of smoking could mean smoking 1 pack a day for 30 years or 2 packs a day for 15 years). Yearly screening should continue until the smoker has stopped smoking for at least 15 years. Yearly screening should be stopped for people who develop a health problem that would prevent them from having lung cancer treatment.  If you choose to drink alcohol, do not have more than 2 drinks per day. One drink is considered to be 12 oz (360 mL) of beer, 5 oz (150 mL) of wine, or 1.5 oz (45 mL) of liquor.  Avoid the use of street drugs. Do not share needles with anyone. Ask for help if you need support or instructions about stopping  the use of drugs.  High blood pressure causes heart disease and increases the risk of stroke. Blood pressure should be checked at least every 1-2 years. Ongoing high blood pressure should be treated with medicines if weight loss and exercise are not effective.  If you are 31-41 years old, ask your health care provider if you should take aspirin to prevent heart disease.  Diabetes screening involves taking a blood sample to check your fasting blood sugar level. This should be done once every 3 years after age 60 if you are at a normal weight and without risk  factors for diabetes. Testing should be considered at a younger age or be carried out more frequently if you are overweight and have at least 1 risk factor for diabetes.  Colorectal cancer can be detected and often prevented. Most routine colorectal cancer screening begins at the age of 87 and continues through age 73. However, your health care provider may recommend screening at an earlier age if you have risk factors for colon cancer. On a yearly basis, your health care provider may provide home test kits to check for hidden blood in the stool. A small camera at the end of a tube may be used to directly examine the colon (sigmoidoscopy or colonoscopy) to detect the earliest forms of colorectal cancer. Talk to your health care provider about this at age 24 when routine screening begins. A direct exam of the colon should be repeated every 5-10 years through age 87, unless early forms of precancerous polyps or small growths are found.  People who are at an increased risk for hepatitis B should be screened for this virus. You are considered at high risk for hepatitis B if:  You were born in a country where hepatitis B occurs often. Talk with your health care provider about which countries are considered high risk.  Your parents were born in a high-risk country and you have not received a shot to protect against hepatitis B (hepatitis B vaccine).  You have HIV or AIDS.  You use needles to inject street drugs.  You live with, or have sex with, someone who has hepatitis B.  You are a man who has sex with other men (MSM).  You get hemodialysis treatment.  You take certain medicines for conditions like cancer, organ transplantation, and autoimmune conditions.  Hepatitis C blood testing is recommended for all people born from [birth year] through 1963 and any individual with known risk factors for hepatitis C.  Healthy men should no longer receive prostate-specific antigen (PSA) blood tests as part of  routine cancer screening. Talk to your health care provider about prostate cancer screening.  Testicular cancer screening is not recommended for adolescents or adult males who have no symptoms. Screening includes self-exam, a health care provider exam, and other screening tests. Consult with your health care provider about any symptoms you have or any concerns you have about testicular cancer.  Practice safe sex. Use condoms and avoid high-risk sexual practices to reduce the spread of sexually transmitted infections (STIs).  You should be screened for STIs, including gonorrhea and chlamydia if:  You are sexually active and are younger than 24 years.  You are older than 24 years, and your health care provider tells you that you are at risk for this type of infection.  Your sexual activity has changed since you were last screened, and you are at an increased risk for chlamydia or gonorrhea. Ask your health care provider if you are at  risk.  If you are at risk of being infected with HIV, it is recommended that you take a prescription medicine daily to prevent HIV infection. This is called pre-exposure prophylaxis (PrEP). You are considered at risk if:  You are a man who has sex with other men (MSM).  You are a heterosexual man who is sexually active with multiple partners.  You take drugs by injection.  You are sexually active with a partner who has HIV.  Talk with your health care provider about whether you are at high risk of being infected with HIV. If you choose to begin PrEP, you should first be tested for HIV. You should then be tested every 3 months for as long as you are taking PrEP.  Use sunscreen. Apply sunscreen liberally and repeatedly throughout the day. You should seek shade when your shadow is shorter than you. Protect yourself by wearing long sleeves, pants, a wide-brimmed hat, and sunglasses year round whenever you are outdoors.  Tell your health care provider of new moles  or changes in moles, especially if there is a change in shape or color. Also, tell your health care provider if a mole is larger than the size of a pencil eraser.  A one-time screening for abdominal aortic aneurysm (AAA) and surgical repair of large AAAs by ultrasound is recommended for men aged 76-75 years who are current or former smokers.  Stay current with your vaccines (immunizations). Document Released: 06/25/2007 Document Revised: 01/01/2013 Document Reviewed: 05/24/2010 [facility] Patient Information 2015 [place], [place]. This information is not intended to replace advice given to you by your health care provider. Make sure you discuss any questions you have with your health care provider.

## 2014-10-13 IMAGING — RF DG CHOLANGIOGRAM OPERATIVE
1 series · 4 of 4 positions shown · non-contrast
Comparison: ERCP 12/23/2012

CLINICAL DATA: Cholecystectomy.

EXAM:
INTRAOPERATIVE CHOLANGIOGRAM
TECHNIQUE: Cholangiographic images from the C-arm fluoroscopic device were
submitted for interpretation post-operatively. Please see the
procedural report for the amount of contrast and the fluoroscopy
time utilized.

[Series 1: run · 4 of 88 frames shown]
[frame 14/88]
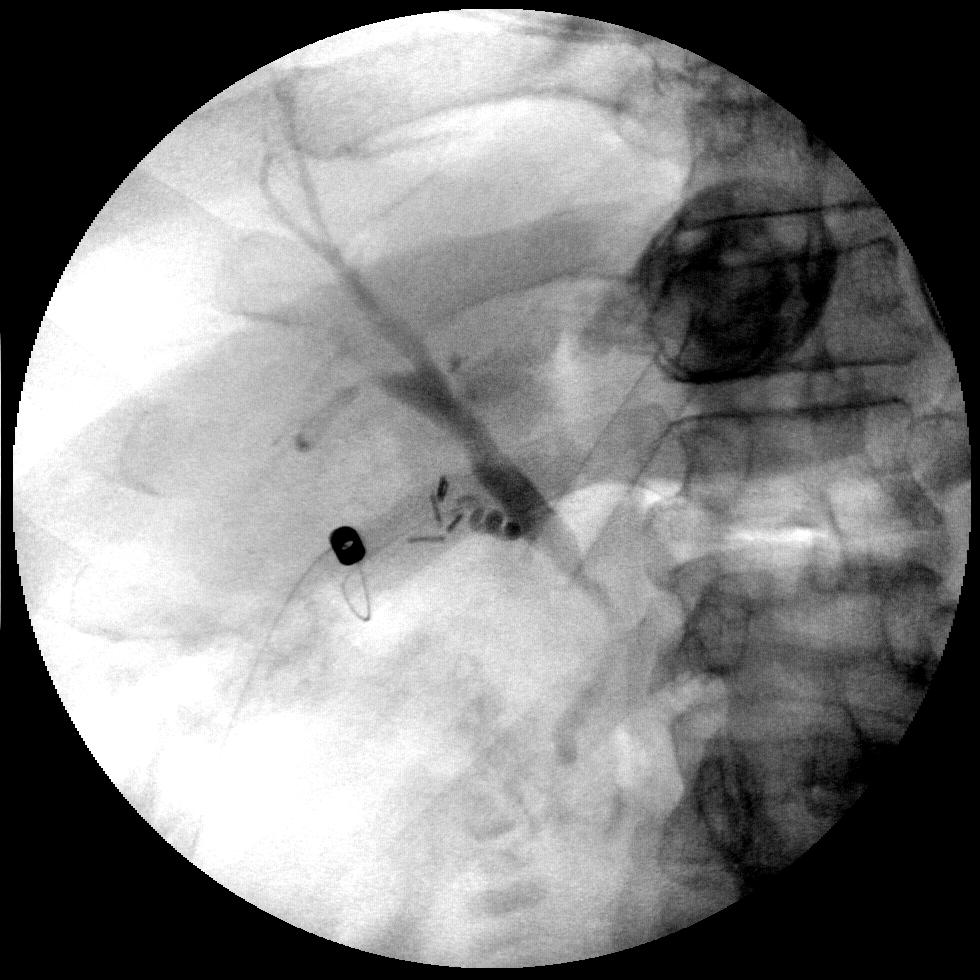
[frame 45/88]
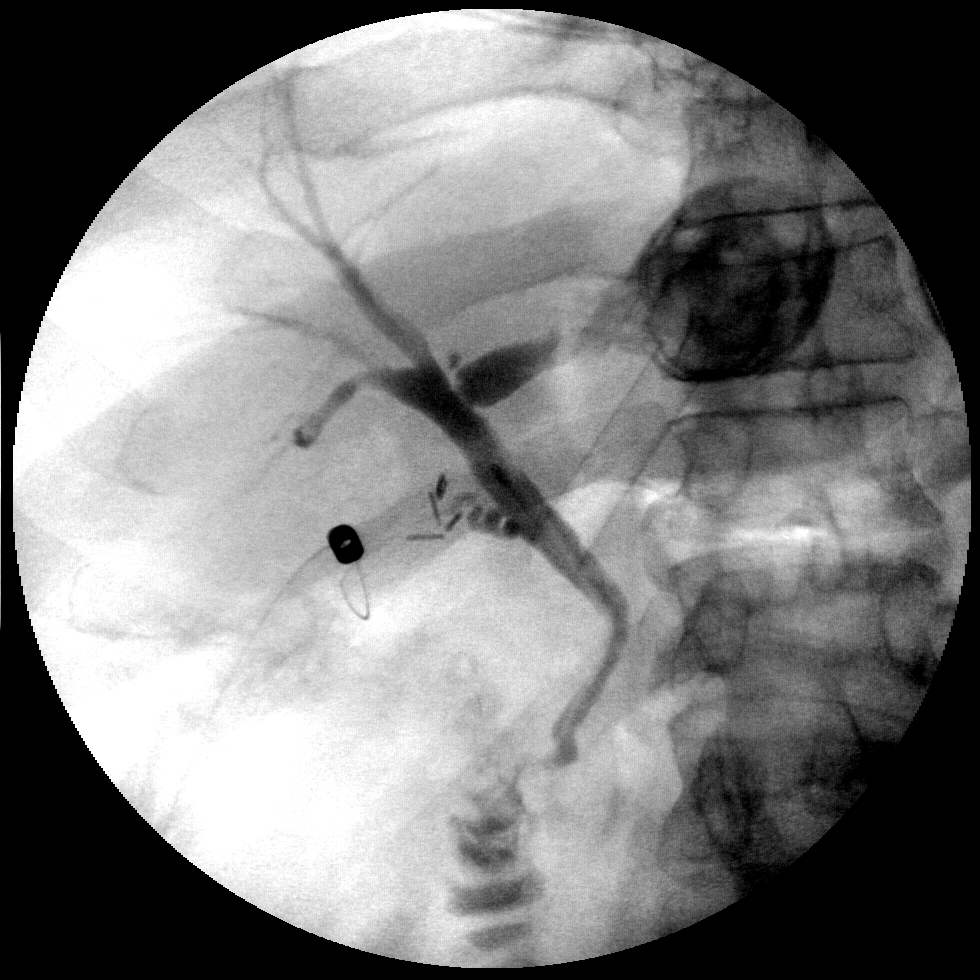
[frame 68/88]
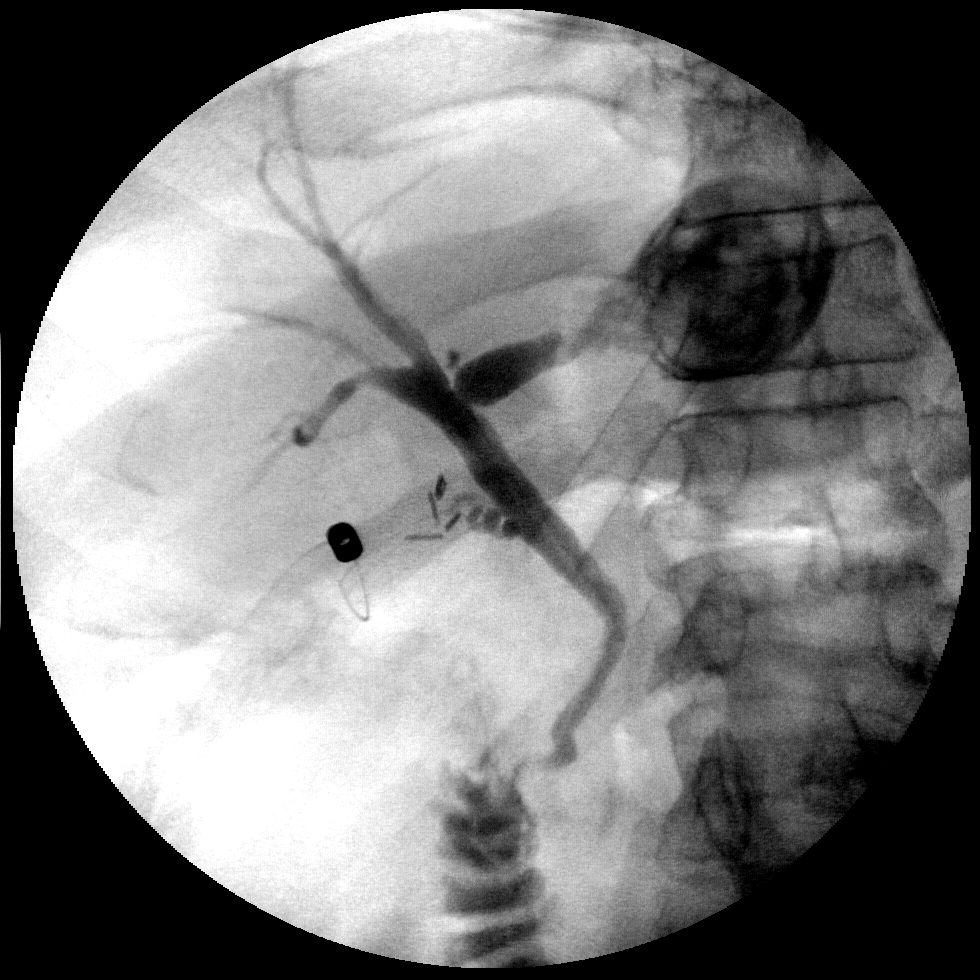
[frame 75/88]
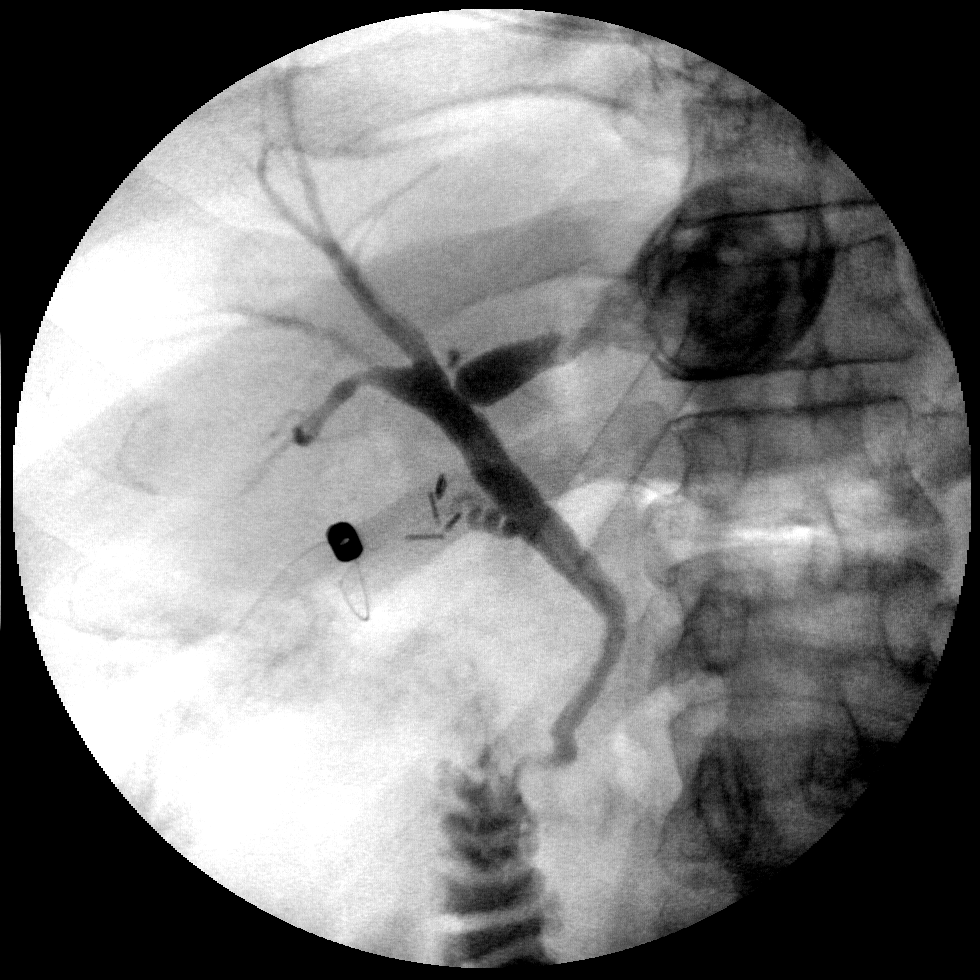

[4 of 4 positions shown; findings below may reference images not displayed]

FINDINGS: Opacification of the cystic duct, common hepatic duct and common
bile duct. There is also some filling of the intrahepatic ducts. The
left hepatic duct is slightly prominent. Difficult to exclude linear
nonobstructive filling defects within the common hepatic duct and
common bile duct. Contrast drains into the duodenum.
IMPRESSION: The biliary system is patent without obstruction or large stones.

Questionable nonobstructive filling defects that could represent
sludge or blood products from recent ERCP.

## 2014-10-15 ENCOUNTER — Encounter: Payer: Self-pay | Admitting: Family Medicine

## 2014-10-15 ENCOUNTER — Ambulatory Visit (INDEPENDENT_AMBULATORY_CARE_PROVIDER_SITE_OTHER): Payer: Medicare Other | Admitting: Family Medicine

## 2014-10-15 VITALS — BP 120/67 | HR 64 | Temp 97.8°F | Ht 66.5 in | Wt 175.0 lb

## 2014-10-15 DIAGNOSIS — S39012A Strain of muscle, fascia and tendon of lower back, initial encounter: Secondary | ICD-10-CM

## 2014-10-15 NOTE — Progress Notes (Signed)
Pre visit review using our clinic review tool, if applicable. No additional management support is needed unless otherwise documented below in the visit note. 

## 2014-10-15 NOTE — Progress Notes (Signed)
   Subjective:    Patient ID: Kenneth Little, male    DOB: 20-Apr-1945, 69 y.o.   MRN: [id number]  HPI Here for 3 days of lower back pain. He has no hx of back problems, but last weekend as he bent over to get a pan out of a kitchen cabinet he felt a sudden pulling and pain in the center of his lower back. That evening the muscles began to tighten up and by the next morning he had a lot of spasm and pain. He rested, did stretches, and applied [facility] with some relief. Then last night he took 2 Aleve tablets and immediately felt better. Today he is almost back to normal although he is still a little stiff. He never had pain or numbenss in the legs.    Review of Systems  Constitutional: Negative.   Musculoskeletal: Positive for back pain. Negative for neck pain.       Objective:   Physical Exam  Constitutional: He appears well-developed and well-nourished.  He walks a little stiffly  Musculoskeletal:  The lower back is slightly tender but has full ROM. Negative SLR.          Assessment & Plan:  Low back strain which has almost resolved. He will use heat and Aleve prn. Recheck as needed

## 2014-11-11 ENCOUNTER — Ambulatory Visit (INDEPENDENT_AMBULATORY_CARE_PROVIDER_SITE_OTHER): Payer: Medicare Other | Admitting: Internal Medicine

## 2014-11-11 ENCOUNTER — Ambulatory Visit (INDEPENDENT_AMBULATORY_CARE_PROVIDER_SITE_OTHER): Payer: Medicare Other | Admitting: Family Medicine

## 2014-11-11 ENCOUNTER — Encounter: Payer: Self-pay | Admitting: Internal Medicine

## 2014-11-11 VITALS — BP 120/70 | Temp 97.7°F | Ht 66.5 in | Wt 175.9 lb

## 2014-11-11 DIAGNOSIS — R059 Cough, unspecified: Secondary | ICD-10-CM

## 2014-11-11 DIAGNOSIS — I10 Essential (primary) hypertension: Secondary | ICD-10-CM

## 2014-11-11 DIAGNOSIS — R05 Cough: Secondary | ICD-10-CM

## 2014-11-11 DIAGNOSIS — J988 Other specified respiratory disorders: Secondary | ICD-10-CM | POA: Diagnosis not present

## 2014-11-11 NOTE — Progress Notes (Signed)
Pre visit review using our clinic review tool, if applicable. No additional management support is needed unless otherwise documented below in the visit note.   Chief Complaint  Patient presents with  . Cough  . Itchy Throat    HPI: Patient Kenneth Little  comes in today for SDA for  new problem evaluation. PCP NA   3.5  Yo grand  Child  Has cough   And  An pna  In school.      3-4 days .  So getting checked out.  itdhy throat and dry cough for 3 days    Not that much . Feeling sick with it   No fever.  Played basketball . Marland Kitchen  Last pm no cp sob chillss   Eyes itch but not chest and nose is ok  Using honey at times   Remote hx of acei cough  ROS: See pertinent positives and negatives per HPI. Controlled dm no resp sx asthma  tob   Past Medical History  Diagnosis Date  . Hypertension   . Hyperlipidemia   . UTI (lower urinary tract infection)   . Diabetes mellitus without complication ([place])     Family History  Problem Relation Age of Onset  . Cancer Mother     breast  . Hypertension Father   . Diabetes Brother     Social History   Social History  . Marital Status: Married    Spouse Name: N/A  . Number of Children: N/A  . Years of Education: N/A   Social History Main Topics  . Smoking status: Former Smoker    Types: Cigarettes    Quit date: 11/20/1988  . Smokeless tobacco: Never Used  . Alcohol Use: No  . Drug Use: No  . Sexual Activity: Yes   Other Topics Concern  . None   Social History Narrative    Outpatient Prescriptions Prior to Visit  Medication Sig Dispense Refill  . aspirin EC 81 MG tablet Take 81 mg by mouth every morning.     Marland Kitchen atorvastatin (LIPITOR) 40 MG tablet Take 40 mg by mouth every evening.     . calcium-vitamin D (OSCAL WITH D) 500-200 MG-UNIT per tablet Take 1 tablet by mouth every morning.    . ergocalciferol (VITAMIN D2) 50000 UNITS capsule Take 50,000 Units by mouth once a week. Thursday    . insulin glargine (LANTUS) 100 UNIT/ML injection  Inject 25 Units into the skin at bedtime.     . INVOKANA 300 MG TABS Take 1 tablet by mouth every morning.     . metFORMIN (GLUCOPHAGE) 1000 MG tablet Take 1,000 mg by mouth 2 (two) times daily with a meal.    . ONE TOUCH ULTRA TEST test strip 1 each by Other route daily as needed.     . pioglitazone (ACTOS) 45 MG tablet Take 45 mg by mouth every morning.     . sitaGLIPtin (JANUVIA) 100 MG tablet Take 100 mg by mouth every morning.     . valsartan (DIOVAN) 320 MG tablet Take 320 mg by mouth every morning.      No facility-administered medications prior to visit.     EXAM:  BP 120/70 mmHg  Temp(Src) 97.7 F (36.5 C) (Oral)  Ht 5' 6.5" (1.689 m)  Wt 175 lb 14.4 oz (79.788 kg)  BMI 27.97 kg/m2  SpO2 97%  Body mass index is 27.97 kg/(m^2).  GENERAL: vitals reviewed and listed above, alert, oriented, appears well hydrated and in no  acute distress HEENT: atraumatic, conjunctiva  clear, no obvious abnormalities on inspection of external nose and ears OP : no lesion edema or exudate  Nares clear  Looks slightly  Allergic looks well  NECK: no obvious masses on inspection palpation  LUNGS: clear to auscultation bilaterally, no wheezes, rales or rhonchi, good air movement CV: HRRR, no clubbing cyanosis or  peripheral edema nl cap refill  MS: moves all extremities without noticeable focal  abnormality PSYCH: pleasant and cooperative, no obvious depression or anxiety  ASSESSMENT AND PLAN:  Discussed the following assessment and plan:  Cough  RTI (respiratory tract infection) Suspect uncomplicated  resp infection viral with good exam  Could have underlying allergy    Expectant management. For alarm sx  Etc   .  -Patient advised to return or notify health care team  if symptoms worsen ,persist or new concerns arise.  Patient Instructions   Your lung exam is normal today .  And reassuring .   Suspect a viral  Respiratory infection  That should run its course.   But if get fever  Short  of breath get back with [place].  For reexam.      Standley Brooking. Panosh M.D.

## 2014-11-11 NOTE — Progress Notes (Signed)
   Subjective:    Patient ID: Kenneth Little, male    DOB: 10-26-1945, 69 y.o.   MRN: [id number]  HPI    Review of Systems     Objective:   Physical Exam        Assessment & Plan:

## 2014-11-11 NOTE — Patient Instructions (Signed)
   Your lung exam is normal today .  And reassuring .   Suspect a viral  Respiratory infection  That should run its course.   But if get fever  Short of breath get back with [place].  For reexam.

## 2015-01-29 ENCOUNTER — Encounter: Payer: Self-pay | Admitting: Internal Medicine

## 2015-01-29 ENCOUNTER — Ambulatory Visit (INDEPENDENT_AMBULATORY_CARE_PROVIDER_SITE_OTHER): Payer: Medicare Other | Admitting: Internal Medicine

## 2015-01-29 ENCOUNTER — Telehealth: Payer: Self-pay | Admitting: *Deleted

## 2015-01-29 VITALS — BP 140/78 | HR 63 | Temp 98.1°F | Resp 20 | Ht 66.5 in | Wt 174.0 lb

## 2015-01-29 DIAGNOSIS — R319 Hematuria, unspecified: Secondary | ICD-10-CM | POA: Diagnosis not present

## 2015-01-29 DIAGNOSIS — R310 Gross hematuria: Secondary | ICD-10-CM

## 2015-01-29 LAB — CBC WITH DIFFERENTIAL/PLATELET
Basophils Absolute: 0 K/uL (ref 0.0–0.1)
Basophils Relative: 0.3 % (ref 0.0–3.0)
Eosinophils Absolute: 0.2 K/uL (ref 0.0–0.7)
Eosinophils Relative: 3.8 % (ref 0.0–5.0)
HCT: 48.4 % (ref 39.0–52.0)
Hemoglobin: 16 g/dL (ref 13.0–17.0)
Lymphocytes Relative: 31.1 % (ref 12.0–46.0)
Lymphs Abs: 1.8 K/uL (ref 0.7–4.0)
MCHC: 33 g/dL (ref 30.0–36.0)
MCV: 93.6 fl (ref 78.0–100.0)
Monocytes Absolute: 0.7 K/uL (ref 0.1–1.0)
Monocytes Relative: 11.4 % (ref 3.0–12.0)
Neutro Abs: 3.1 K/uL (ref 1.4–7.7)
Neutrophils Relative %: 53.4 % (ref 43.0–77.0)
Platelets: 196 K/uL (ref 150.0–400.0)
RBC: 5.18 Mil/uL (ref 4.22–5.81)
RDW: 14 % (ref 11.5–15.5)
WBC: 5.7 K/uL (ref 4.0–10.5)

## 2015-01-29 LAB — POCT URINALYSIS DIPSTICK
Bilirubin, UA: NEGATIVE
Ketones, UA: NEGATIVE
Leukocytes, UA: NEGATIVE
Nitrite, UA: NEGATIVE
Protein, UA: NEGATIVE
Spec Grav, UA: 1.015
Urobilinogen, UA: 0.2
pH, UA: 6

## 2015-01-29 LAB — BASIC METABOLIC PANEL
BUN: 23 mg/dL (ref 6–23)
CHLORIDE: 105 meq/L (ref 96–112)
CO2: 32 meq/L (ref 19–32)
CREATININE: 0.84 mg/dL (ref 0.40–1.50)
Calcium: 10.1 mg/dL (ref 8.4–10.5)
GFR: 96.82 mL/min (ref >60.00)
Glucose, Bld: 125 mg/dL — ABNORMAL HIGH (ref 70–99)
POTASSIUM: 4.9 meq/L (ref 3.5–5.1)
Sodium: 143 mEq/L (ref 135–145)

## 2015-01-29 NOTE — Patient Instructions (Signed)
Urological evaluation as discussed

## 2015-01-29 NOTE — Telephone Encounter (Signed)
Stat referral for urology placed. Patient has appointment at 1:45pm on Monday with Dr. Jeffie Pollock at [facility]. Marge at [facility] confirmed she will contact patient to make aware of appointment.

## 2015-01-29 NOTE — Progress Notes (Signed)
Subjective:    Patient ID: Kenneth Little, male    DOB: 1945/04/23, 70 y.o.   MRN: [id number]  HPI  70 year old patient who is followed by urology for BPH and elevated PSA.  On January 16 noted some pink tinged urine.  For the past 24 hours she has noted more gross hematuria with clots.  No dysuria, fever or other complaints.  He generally feels well  Past Medical History  Diagnosis Date  . Hypertension   . Hyperlipidemia   . UTI (lower urinary tract infection)   . Diabetes mellitus without complication [facility])     Social History   Social History  . Marital Status: Married    Spouse Name: N/A  . Number of Children: N/A  . Years of Education: N/A   Occupational History  . Not on file.   Social History Main Topics  . Smoking status: Former Smoker    Types: Cigarettes    Quit date: 11/20/1988  . Smokeless tobacco: Never Used  . Alcohol Use: No  . Drug Use: No  . Sexual Activity: Yes   Other Topics Concern  . Not on file   Social History Narrative    Past Surgical History  Procedure Laterality Date  . Hemorrhoid surgery  03/20/12    internal  . Diverticulitis    . Abdominal surgery    . Hernia repair    . Ercp N/A 12/23/2012    Procedure: ENDOSCOPIC RETROGRADE CHOLANGIOPANCREATOGRAPHY (ERCP);  Surgeon: Jeryl Columbia, MD;  Location: [facility];  Service: Endoscopy;  Laterality: N/A;  . Cholecystectomy N/A 12/24/2012    Procedure: LAPAROSCOPIC CHOLECYSTECTOMY WITH INTRAOPERATIVE CHOLANGIOGRAM;  Surgeon: Shann Medal, MD;  Location: [facility];  Service: General;  Laterality: N/A;    Family History  Problem Relation Age of Onset  . Cancer Mother     breast  . Hypertension Father   . Diabetes Brother     No Known Allergies  Current Outpatient Prescriptions on File Prior to Visit  Medication Sig Dispense Refill  . aspirin EC 81 MG tablet Take 81 mg by mouth every morning.     Marland Kitchen atorvastatin (LIPITOR) 40 MG tablet Take 40 mg by mouth every evening.     . calcium-vitamin  D (OSCAL WITH D) 500-200 MG-UNIT per tablet Take 1 tablet by mouth every morning.    . ergocalciferol (VITAMIN D2) 50000 UNITS capsule Take 50,000 Units by mouth once a week. Thursday    . insulin glargine (LANTUS) 100 UNIT/ML injection Inject 25 Units into the skin at bedtime.     . INVOKANA 300 MG TABS Take 1 tablet by mouth every morning.     . metFORMIN (GLUCOPHAGE) 1000 MG tablet Take 1,000 mg by mouth 2 (two) times daily with a meal.    . ONE TOUCH ULTRA TEST test strip 1 each by Other route daily as needed.     . pioglitazone (ACTOS) 45 MG tablet Take 45 mg by mouth every morning.     . sitaGLIPtin (JANUVIA) 100 MG tablet Take 100 mg by mouth every morning.     . valsartan (DIOVAN) 320 MG tablet Take 320 mg by mouth every morning.      No current facility-administered medications on file prior to visit.    BP 140/78 mmHg  Pulse 63  Temp(Src) 98.1 F (36.7 C) (Oral)  Resp 20  Ht 5' 6.5" (1.689 m)  Wt 174 lb (78.926 kg)  BMI 27.67 kg/m2  SpO2 97%  Review of Systems  Genitourinary: Positive for frequency and hematuria. Negative for dysuria, flank pain, discharge, penile swelling, penile pain and testicular pain.       Objective:   Physical Exam  Constitutional: He appears well-developed and well-nourished. No distress.          Assessment & Plan:   Gross hematuria. We'll set up for urgent urological evaluation

## 2015-01-29 NOTE — Progress Notes (Signed)
Pre visit review using our clinic review tool, if applicable. No additional management support is needed unless otherwise documented below in the visit note. 

## 2015-01-30 ENCOUNTER — Encounter (HOSPITAL_COMMUNITY): Payer: Self-pay | Admitting: *Deleted

## 2015-01-30 ENCOUNTER — Emergency Department (HOSPITAL_COMMUNITY)
Admission: EM | Admit: 2015-01-30 | Discharge: 2015-01-31 | Disposition: A | Discharge status: Home / Self Care | Payer: Medicare Other | Attending: Emergency Medicine | Admitting: Emergency Medicine

## 2015-01-30 DIAGNOSIS — Z79899 Other long term (current) drug therapy: Secondary | ICD-10-CM | POA: Insufficient documentation

## 2015-01-30 DIAGNOSIS — E785 Hyperlipidemia, unspecified: Secondary | ICD-10-CM | POA: Diagnosis not present

## 2015-01-30 DIAGNOSIS — Z7982 Long term (current) use of aspirin: Secondary | ICD-10-CM | POA: Insufficient documentation

## 2015-01-30 DIAGNOSIS — R600 Localized edema: Secondary | ICD-10-CM | POA: Diagnosis not present

## 2015-01-30 DIAGNOSIS — R310 Gross hematuria: Secondary | ICD-10-CM

## 2015-01-30 DIAGNOSIS — Z7984 Long term (current) use of oral hypoglycemic drugs: Secondary | ICD-10-CM | POA: Diagnosis not present

## 2015-01-30 DIAGNOSIS — N390 Urinary tract infection, site not specified: Secondary | ICD-10-CM | POA: Insufficient documentation

## 2015-01-30 DIAGNOSIS — Z87891 Personal history of nicotine dependence: Secondary | ICD-10-CM | POA: Insufficient documentation

## 2015-01-30 DIAGNOSIS — Z794 Long term (current) use of insulin: Secondary | ICD-10-CM | POA: Diagnosis not present

## 2015-01-30 DIAGNOSIS — E119 Type 2 diabetes mellitus without complications: Secondary | ICD-10-CM | POA: Insufficient documentation

## 2015-01-30 DIAGNOSIS — R319 Hematuria, unspecified: Secondary | ICD-10-CM

## 2015-01-30 DIAGNOSIS — I10 Essential (primary) hypertension: Secondary | ICD-10-CM | POA: Insufficient documentation

## 2015-01-30 LAB — URINALYSIS, ROUTINE W REFLEX MICROSCOPIC
Bilirubin Urine: NEGATIVE
Glucose, UA: >1000 mg/dL — AB
Ketones, ur: NEGATIVE mg/dL
Nitrite: POSITIVE — AB
Specific Gravity, Urine: 1.043 — ABNORMAL HIGH (ref 1.005–1.030)
pH: 6.5 (ref 5.0–8.0)

## 2015-01-30 LAB — URINE MICROSCOPIC-ADD ON
Bacteria, UA: NONE SEEN
SQUAMOUS EPITHELIAL / LPF: NONE SEEN

## 2015-01-30 NOTE — ED Notes (Signed)
Pt complains of blood in his urine since Jan 7th. Pt states he saw his doctor on Wednesday and has an appointment with a urologist on Monday, but that the bleeding has become worse in the meantime. Pt was advised by urology office to come to ED for evaluation. Pt denies pain.

## 2015-01-31 LAB — CBC WITH DIFFERENTIAL/PLATELET
Basophils Absolute: 0 10*3/uL (ref 0.0–0.1)
Basophils Relative: 0 %
EOS PCT: 3 %
Eosinophils Absolute: 0.2 10*3/uL (ref 0.0–0.7)
HEMATOCRIT: 45.8 % (ref 39.0–52.0)
HEMOGLOBIN: 14.8 g/dL (ref 13.0–17.0)
LYMPHS ABS: 1.7 10*3/uL (ref 0.7–4.0)
Lymphocytes Relative: 31 %
MCH: 31.3 pg (ref 26.0–34.0)
MCHC: 32.3 g/dL (ref 30.0–36.0)
MCV: 96.8 fL (ref 78.0–100.0)
Monocytes Absolute: 0.4 10*3/uL (ref 0.1–1.0)
Monocytes Relative: 8 %
Neutro Abs: 3.1 10*3/uL (ref 1.7–7.7)
Neutrophils Relative %: 58 %
Platelets: 166 10*3/uL (ref 150–400)
RBC: 4.73 MIL/uL (ref 4.22–5.81)
RDW: 13.9 % (ref 11.5–15.5)
WBC: 5.4 10*3/uL (ref 4.0–10.5)

## 2015-01-31 LAB — BASIC METABOLIC PANEL
Anion gap: 7 (ref 5–15)
BUN: 22 mg/dL — AB (ref 6–20)
CHLORIDE: 107 mmol/L (ref 101–111)
CO2: 25 mmol/L (ref 22–32)
Calcium: 8.7 mg/dL — ABNORMAL LOW (ref 8.9–10.3)
Creatinine, Ser: 0.74 mg/dL (ref 0.61–1.24)
GFR calc Af Amer: >60 mL/min (ref >60)
GFR calc non Af Amer: >60 mL/min (ref >60)
Glucose, Bld: 210 mg/dL — ABNORMAL HIGH (ref 65–99)
POTASSIUM: 4.1 mmol/L (ref 3.5–5.1)
SODIUM: 139 mmol/L (ref 135–145)

## 2015-01-31 MED ORDER — CEPHALEXIN 500 MG PO CAPS
1000.0000 mg | ORAL_CAPSULE | Freq: Once | ORAL | Status: AC
  Administered 2015-01-31: 1000 mg via ORAL
  Filled 2015-01-31: qty 2

## 2015-01-31 MED ORDER — CEPHALEXIN 500 MG PO CAPS: 500.0000 mg | ORAL_CAPSULE | Freq: Four times a day (QID) | ORAL | Status: DC

## 2015-01-31 NOTE — ED Provider Notes (Signed)
CSN: [id number]     Arrival date & time 01/30/15  2154 History  By signing my name below, I, Irene Pap, attest that this documentation has been prepared under the direction and in the presence of Delora Fuel, MD. Electronically Signed: Irene Pap, ED Scribe. 01/31/2015. 1:28 AM.  Chief Complaint  Patient presents with  . Hematuria   The history is provided by a relative and the patient. No language interpreter was used.  HPI Comments: Kenneth Little is a 70 y.o. male who presents to the Emergency Department complaining of gradually worsening, bright red hematuria onset 3 days ago. Relative states that pt first had an instance of hematuria on January 7th, but it was a single episode that disappeared after pt drank a lot of water. She states that he began to have worsening hematuria on Wednesday, with clots ranging in size from a grain of rice to a nickel. She states that tonight the hematuria is the worst it has been. He reports associated urgency and bilateral knee pain. Pt saw his PCP on Wednesday and has a urology appointment on Monday. He denies fever, chills, nausea, vomiting, abdominal pain, back pain, dysuria or frequency.   Past Medical History  Diagnosis Date  . Hypertension   . Hyperlipidemia   . UTI (lower urinary tract infection)   . Diabetes mellitus without complication [facility])    Past Surgical History  Procedure Laterality Date  . Hemorrhoid surgery  03/20/12    internal  . Diverticulitis    . Abdominal surgery    . Hernia repair    . Ercp N/A 12/23/2012    Procedure: ENDOSCOPIC RETROGRADE CHOLANGIOPANCREATOGRAPHY (ERCP);  Surgeon: Jeryl Columbia, MD;  Location: [facility];  Service: Endoscopy;  Laterality: N/A;  . Cholecystectomy N/A 12/24/2012    Procedure: LAPAROSCOPIC CHOLECYSTECTOMY WITH INTRAOPERATIVE CHOLANGIOGRAM;  Surgeon: Shann Medal, MD;  Location: [facility];  Service: General;  Laterality: N/A;   Family History  Problem Relation Age of Onset  . Cancer Mother      breast  . Hypertension Father   . Diabetes Brother    Social History  Substance Use Topics  . Smoking status: Former Smoker    Types: Cigarettes    Quit date: 11/20/1988  . Smokeless tobacco: Never Used  . Alcohol Use: No    Review of Systems  Constitutional: Negative for fever and chills.  Gastrointestinal: Negative for nausea, vomiting and abdominal pain.  Genitourinary: Positive for urgency and hematuria. Negative for dysuria and frequency.  Musculoskeletal: Negative for back pain.  All other systems reviewed and are negative.  Allergies  Review of patient's allergies indicates no known allergies.  Home Medications   Prior to Admission medications   Medication Sig Start Date End Date Taking? Authorizing Provider  aspirin EC 81 MG tablet Take 81 mg by mouth every morning.     Historical Provider, MD  atorvastatin (LIPITOR) 40 MG tablet Take 40 mg by mouth every evening.     Historical Provider, MD  calcium-vitamin D (OSCAL WITH D) 500-200 MG-UNIT per tablet Take 1 tablet by mouth every morning.    Historical Provider, MD  ergocalciferol (VITAMIN D2) 50000 UNITS capsule Take 50,000 Units by mouth once a week. Thursday    Historical Provider, MD  insulin glargine (LANTUS) 100 UNIT/ML injection Inject 25 Units into the skin at bedtime.     Historical Provider, MD  INVOKANA 300 MG TABS Take 1 tablet by mouth every morning.  11/08/11   Historical Provider,  MD  metFORMIN (GLUCOPHAGE) 1000 MG tablet Take 1,000 mg by mouth 2 (two) times daily with a meal.    Historical Provider, MD  pioglitazone (ACTOS) 45 MG tablet Take 45 mg by mouth every morning.  09/05/11   Historical Provider, MD  sitaGLIPtin (JANUVIA) 100 MG tablet Take 100 mg by mouth every morning.     Historical Provider, MD  valsartan (DIOVAN) 320 MG tablet Take 320 mg by mouth every morning.     Historical Provider, MD   BP 144/67 mmHg  Pulse 73  Temp(Src) 97.8 F (36.6 C) (Oral)  Resp 18  SpO2 96% Physical Exam   Constitutional: He is oriented to person, place, and time. He appears well-developed and well-nourished.  HENT:  Head: Normocephalic and atraumatic.  Eyes: EOM are normal. Pupils are equal, round, and reactive to light.  Neck: Normal range of motion. Neck supple. No JVD present.  Cardiovascular: Normal rate, regular rhythm and normal heart sounds.  Exam reveals no gallop and no friction rub.   No murmur heard. Pulmonary/Chest: Effort normal and breath sounds normal. He has no wheezes. He has no rales. He exhibits no tenderness.  Abdominal: Soft. Bowel sounds are normal. He exhibits no distension and no mass. There is no tenderness. There is no CVA tenderness.  Musculoskeletal: Normal range of motion.  Trace edema bilaterally  Lymphadenopathy:    He has no cervical adenopathy.  Neurological: He is alert and oriented to person, place, and time. No cranial nerve deficit. He exhibits normal muscle tone. Coordination normal.  Skin: Skin is warm and dry. No rash noted.  Psychiatric: He has a normal mood and affect. His behavior is normal. Judgment and thought content normal.  Nursing note and vitals reviewed.   ED Course  Procedures (including critical care time) DIAGNOSTIC STUDIES: Oxygen Saturation is 96% on RA, normal by my interpretation.    COORDINATION OF CARE: 12:10 AM-Discussed treatment plan which includes labs with pt at bedside and pt agreed to plan.    Labs Review Results for orders placed or performed during the hospital encounter of 01/30/15  Urinalysis, Routine w reflex microscopic-may I&O cath if menses (not at [facility])  Result Value Ref Range   Color, Urine RED (A) YELLOW   APPearance CLOUDY (A) CLEAR   Specific Gravity, Urine 1.043 (H) 1.005 - 1.030   pH 6.5 5.0 - 8.0   Glucose, UA >1000 (A) NEGATIVE mg/dL   Hgb urine dipstick LARGE (A) NEGATIVE   Bilirubin Urine NEGATIVE NEGATIVE   Ketones, ur NEGATIVE NEGATIVE mg/dL   Protein, ur >300 (A) NEGATIVE mg/dL   Nitrite  POSITIVE (A) NEGATIVE   Leukocytes, UA SMALL (A) NEGATIVE  Urine microscopic-add on  Result Value Ref Range   Squamous Epithelial / LPF NONE SEEN NONE SEEN   WBC, UA TOO NUMEROUS TO COUNT 0 - 5 WBC/hpf   RBC / HPF TOO NUMEROUS TO COUNT 0 - 5 RBC/hpf   Bacteria, UA NONE SEEN NONE SEEN   Urine-Other FIELD OBSCURED BY RBC'S   Basic metabolic panel  Result Value Ref Range   Sodium 139 135 - 145 mmol/L   Potassium 4.1 3.5 - 5.1 mmol/L   Chloride 107 101 - 111 mmol/L   CO2 25 22 - 32 mmol/L   Glucose, Bld 210 (H) 65 - 99 mg/dL   BUN 22 (H) 6 - 20 mg/dL   Creatinine, Ser 0.74 0.61 - 1.24 mg/dL   Calcium 8.7 (L) 8.9 - 10.3 mg/dL   GFR calc  non Af Amer >60 >60 mL/min   GFR calc Af Amer >60 >60 mL/min   Anion gap 7 5 - 15  CBC with Differential  Result Value Ref Range   WBC 5.4 4.0 - 10.5 K/uL   RBC 4.73 4.22 - 5.81 MIL/uL   Hemoglobin 14.8 13.0 - 17.0 g/dL   HCT 45.8 39.0 - 52.0 %   MCV 96.8 78.0 - 100.0 fL   MCH 31.3 26.0 - 34.0 pg   MCHC 32.3 30.0 - 36.0 g/dL   RDW 13.9 11.5 - 15.5 %   Platelets 166 150 - 400 K/uL   Neutrophils Relative % 58 %   Neutro Abs 3.1 1.7 - 7.7 K/uL   Lymphocytes Relative 31 %   Lymphs Abs 1.7 0.7 - 4.0 K/uL   Monocytes Relative 8 %   Monocytes Absolute 0.4 0.1 - 1.0 K/uL   Eosinophils Relative 3 %   Eosinophils Absolute 0.2 0.0 - 0.7 K/uL   Basophils Relative 0 %   Basophils Absolute 0.0 0.0 - 0.1 K/uL   I have personally reviewed and evaluated these lab results as part of my medical decision-making.   MDM   Final diagnoses:  Gross hematuria  Urinary tract infection with hematuria, site unspecified    Gross hematuria. Old records were reviewed and he was seen in his 84 office 2 days ago for gross hematuria. Urinalysis was repeated today and shows too numerous to count WBCs as well as too numerous to count RBCs. Presumably, or urinary tract infection is responsible for his bleeding. No change in renal function. Urine was sent for  culture and he is started on cephalexin. He is advised to drink plenty of fluids and return should he have inability to urinate or fever. He has follow-up already arranged with his urologist and needs to keep that appointment.  I personally performed the services described in this documentation, which was scribed in my presence. The recorded information has been reviewed and is accurate.      Delora Fuel, MD [phone]

## 2015-01-31 NOTE — Discharge Instructions (Signed)
Return if you are unable to urinate, or if you start running a fever.  Urinary Tract Infection Urinary tract infections (UTIs) can develop anywhere along your urinary tract. Your urinary tract is your body's drainage system for removing wastes and extra water. Your urinary tract includes two kidneys, two ureters, a bladder, and a urethra. Your kidneys are a pair of bean-shaped organs. Each kidney is about the size of your fist. They are located below your ribs, one on each side of your spine. CAUSES Infections are caused by microbes, which are microscopic organisms, including fungi, viruses, and bacteria. These organisms are so small that they can only be seen through a microscope. Bacteria are the microbes that most commonly cause UTIs. SYMPTOMS  Symptoms of UTIs may vary by age and gender of the patient and by the location of the infection. Symptoms in young women typically include a frequent and intense urge to urinate and a painful, burning feeling in the bladder or urethra during urination. Older women and men are more likely to be tired, shaky, and weak and have muscle aches and abdominal pain. A fever may mean the infection is in your kidneys. Other symptoms of a kidney infection include pain in your back or sides below the ribs, nausea, and vomiting. DIAGNOSIS To diagnose a UTI, your caregiver will ask you about your symptoms. Your caregiver will also ask you to provide a urine sample. The urine sample will be tested for bacteria and white blood cells. White blood cells are made by your body to help fight infection. TREATMENT  Typically, UTIs can be treated with medication. Because most UTIs are caused by a bacterial infection, they usually can be treated with the use of antibiotics. The choice of antibiotic and length of treatment depend on your symptoms and the type of bacteria causing your infection. HOME CARE INSTRUCTIONS  If you were prescribed antibiotics, take them exactly as your  caregiver instructs you. Finish the medication even if you feel better after you have only taken some of the medication.  Drink enough water and fluids to keep your urine clear or pale yellow.  Avoid caffeine, tea, and carbonated beverages. They tend to irritate your bladder.  Empty your bladder often. Avoid holding urine for long periods of time.  Empty your bladder before and after sexual intercourse.  After a bowel movement, women should cleanse from front to back. Use each tissue only once. SEEK MEDICAL CARE IF:   You have back pain.  You develop a fever.  Your symptoms do not begin to resolve within 3 days. SEEK IMMEDIATE MEDICAL CARE IF:   You have severe back pain or lower abdominal pain.  You develop chills.  You have nausea or vomiting.  You have continued burning or discomfort with urination. MAKE SURE YOU:   Understand these instructions.  Will watch your condition.  Will get help right away if you are not doing well or get worse.   This information is not intended to replace advice given to you by your health care provider. Make sure you discuss any questions you have with your health care provider.   Document Released: 10/06/2004 Document Revised: 09/17/2014 Document Reviewed: 02/04/2011 Elsevier Interactive Patient Education 2016 Else[employer]  Hematuria, Adult Hematuria is blood in your urine. It can be caused by a bladder infection, kidney infection, prostate infection, kidney stone, or cancer of your urinary tract. Infections can usually be treated with medicine, and a kidney stone usually will pass through your urine.  If neither of these is the cause of your hematuria, further workup to find out the reason may be needed. It is very important that you tell your health care provider about any blood you see in your urine, even if the blood stops without treatment or happens without causing pain. Blood in your urine that happens and then stops and then happens  again can be a symptom of a very serious condition. Also, pain is not a symptom in the initial stages of many urinary cancers. HOME CARE INSTRUCTIONS   Drink lots of fluid, 3-4 quarts a day. If you have been diagnosed with an infection, cranberry juice is especially recommended, in addition to large amounts of water.  Avoid caffeine, tea, and carbonated beverages because they tend to irritate the bladder.  Avoid alcohol because it may irritate the prostate.  Take all medicines as directed by your health care provider.  If you were prescribed an antibiotic medicine, finish it all even if you start to feel better.  If you have been diagnosed with a kidney stone, follow your health care provider's instructions regarding straining your urine to catch the stone.  Empty your bladder often. Avoid holding urine for long periods of time.  After a bowel movement, women should cleanse front to back. Use each tissue only once.  Empty your bladder before and after sexual intercourse if you are a male. SEEK MEDICAL CARE IF:  You develop back pain.  You have a fever.  You have a feeling of sickness in your stomach (nausea) or vomiting.  Your symptoms are not better in 3 days. Return sooner if you are getting worse. SEEK IMMEDIATE MEDICAL CARE IF:   You develop severe vomiting and are unable to keep the medicine down.  You develop severe back or abdominal pain despite taking your medicines.  You begin passing a large amount of blood or clots in your urine.  You feel extremely weak or faint, or you pass out. MAKE SURE YOU:   Understand these instructions.  Will watch your condition.  Will get help right away if you are not doing well or get worse.   This information is not intended to replace advice given to you by your health care provider. Make sure you discuss any questions you have with your health care provider.   Document Released: 12/27/2004 Document Revised: 01/17/2014  Document Reviewed: 08/27/2012 Elsevier Interactive Patient Education [employer].

## 2015-01-31 NOTE — ED Notes (Signed)
MD at bedside. 

## 2015-02-02 LAB — URINE CULTURE: Culture: 6000

## 2015-06-20 ENCOUNTER — Telehealth: Payer: Self-pay

## 2015-06-20 NOTE — Telephone Encounter (Signed)
Patient is on the list for [employer] 2017 and may be a good candidate for an AWV in 2017. Please let me know if/when appt is scheduled.   

## 2015-08-21 ENCOUNTER — Other Ambulatory Visit (INDEPENDENT_AMBULATORY_CARE_PROVIDER_SITE_OTHER): Payer: Medicare Other

## 2015-08-21 DIAGNOSIS — Z0000 Encounter for general adult medical examination without abnormal findings: Secondary | ICD-10-CM | POA: Diagnosis not present

## 2015-08-21 LAB — BASIC METABOLIC PANEL
BUN: 20 mg/dL (ref 6–23)
CHLORIDE: 107 meq/L (ref 96–112)
CO2: 27 meq/L (ref 19–32)
CREATININE: 0.71 mg/dL (ref 0.40–1.50)
Calcium: 8.9 mg/dL (ref 8.4–10.5)
GFR: 117.35 mL/min (ref >60.00)
Glucose, Bld: 121 mg/dL — ABNORMAL HIGH (ref 70–99)
Potassium: 3.8 mEq/L (ref 3.5–5.1)
Sodium: 140 mEq/L (ref 135–145)

## 2015-08-21 LAB — CBC WITH DIFFERENTIAL/PLATELET
BASOS PCT: 0.5 % (ref 0.0–3.0)
Basophils Absolute: 0 10*3/uL (ref 0.0–0.1)
EOS ABS: 0.2 10*3/uL (ref 0.0–0.7)
Eosinophils Relative: 3.4 % (ref 0.0–5.0)
HEMATOCRIT: 45.5 % (ref 39.0–52.0)
Hemoglobin: 15.6 g/dL (ref 13.0–17.0)
Lymphocytes Relative: 29 % (ref 12.0–46.0)
Lymphs Abs: 2.1 10*3/uL (ref 0.7–4.0)
MCHC: 34.3 g/dL (ref 30.0–36.0)
MCV: 92.1 fl (ref 78.0–100.0)
MONO ABS: 0.6 10*3/uL (ref 0.1–1.0)
Monocytes Relative: 8.8 % (ref 3.0–12.0)
NEUTROS ABS: 4.1 10*3/uL (ref 1.4–7.7)
Neutrophils Relative %: 58.3 % (ref 43.0–77.0)
PLATELETS: 204 10*3/uL (ref 150.0–400.0)
RBC: 4.94 Mil/uL (ref 4.22–5.81)
RDW: 13.4 % (ref 11.5–15.5)
WBC: 7.1 10*3/uL (ref 4.0–10.5)

## 2015-08-21 LAB — HEPATIC FUNCTION PANEL
ALT: 19 U/L (ref 0–53)
AST: 16 U/L (ref 0–37)
Albumin: 3.9 g/dL (ref 3.5–5.2)
Alkaline Phosphatase: 65 U/L (ref 39–117)
BILIRUBIN DIRECT: 0.3 mg/dL (ref 0.0–0.3)
BILIRUBIN TOTAL: 1 mg/dL (ref 0.2–1.2)
TOTAL PROTEIN: 6.3 g/dL (ref 6.0–8.3)

## 2015-08-21 LAB — MICROALBUMIN / CREATININE URINE RATIO
CREATININE, U: 104.2 mg/dL
MICROALB/CREAT RATIO: 1.4 mg/g (ref 0.0–30.0)
Microalb, Ur: 1.5 mg/dL (ref 0.0–1.9)

## 2015-08-21 LAB — PSA: PSA: 8.73 ng/mL — ABNORMAL HIGH (ref 0.10–4.00)

## 2015-08-21 LAB — LIPID PANEL
CHOL/HDL RATIO: 2
CHOLESTEROL: 94 mg/dL (ref 0–200)
HDL: 42.6 mg/dL (ref >39.00)
LDL Cholesterol: 38 mg/dL (ref 0–99)
NonHDL: 51.58
TRIGLYCERIDES: 70 mg/dL (ref 0.0–149.0)
VLDL: 14 mg/dL (ref 0.0–40.0)

## 2015-08-21 LAB — TSH: TSH: 2.17 u[IU]/mL (ref 0.35–4.50)

## 2015-08-21 LAB — HEMOGLOBIN A1C: HEMOGLOBIN A1C: 7 % — AB (ref 4.6–6.5)

## 2015-08-28 ENCOUNTER — Encounter: Payer: Self-pay | Admitting: Internal Medicine

## 2015-08-28 ENCOUNTER — Ambulatory Visit (INDEPENDENT_AMBULATORY_CARE_PROVIDER_SITE_OTHER): Payer: Medicare Other | Admitting: Internal Medicine

## 2015-08-28 VITALS — BP 120/68 | HR 67 | Temp 98.5°F | Ht 65.5 in | Wt 169.0 lb

## 2015-08-28 DIAGNOSIS — I10 Essential (primary) hypertension: Secondary | ICD-10-CM

## 2015-08-28 DIAGNOSIS — Z0000 Encounter for general adult medical examination without abnormal findings: Secondary | ICD-10-CM

## 2015-08-28 DIAGNOSIS — E7800 Pure hypercholesterolemia, unspecified: Secondary | ICD-10-CM

## 2015-08-28 NOTE — Patient Instructions (Signed)
Limit your sodium (Salt) intake    It is important that you exercise regularly, at least 20 minutes 3 to 4 times per week.  If you develop chest pain or shortness of breath seek  medical attention.   Please check your hemoglobin A1c every 3-6 months  Return in one year for follow-up

## 2015-08-28 NOTE — Progress Notes (Signed)
Pre visit review using our clinic review tool, if applicable. No additional management support is needed unless otherwise documented below in the visit note. 

## 2015-08-28 NOTE — Progress Notes (Signed)
Subjective:    Patient ID: Kenneth Little, male    DOB: 1945-06-25, 70 y.o.   MRN: [id number]  HPI 23 -year-old patient who is seen today for an annual exam.  He is followed by endocrine for a type 2 diabetes which he has had for at least 15 years. He is on Lantus insulin as well as oral medications. He has treated hypertension and is also on atorvastatin for dyslipidemia. He is seen annually for eye examinations by Marica Otter. His last colonoscopy was performed by Dr. Collene Mares.   He has a history of colonic polyps. He's also followed Dr. Jeffie Pollock for BPH and a history of elevated PSA. He has had a prostate biopsy in the past. He was evaluated by urology earlier in the year due to gross hematuria  Since establishing with the practice, he was admitted in March of 2014 for Escherichia coli sepsis complicating hemorrhoidectomy.  Is also status post laparoscopic cholecystectomy in December of 2014.  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  He  Retired  in June 2015;  Nonsmoker. He does exercise regularly including [facility] [employer] [facility]  Past Medical History:  Diagnosis Date  . Diabetes mellitus without complication ([place])   . Hyperlipidemia   . Hypertension   . UTI (lower urinary tract infection)     Social History   Social History  . Marital status: Married    Spouse name: N/A  . Number of children: N/A  . Years of education: N/A   Occupational History  . Not on file.   Social History Main Topics  . Smoking status: Former Smoker    Types: Cigarettes    Quit date: 11/20/1988  . Smokeless tobacco: Never Used  . Alcohol use No  . Drug use: No  . Sexual activity: Yes   Other Topics Concern  . Not on file   Social History Narrative  . No narrative on file    Past Surgical History:  Procedure  Laterality Date  . ABDOMINAL SURGERY    . CHOLECYSTECTOMY N/A 12/24/2012   Procedure: LAPAROSCOPIC CHOLECYSTECTOMY WITH INTRAOPERATIVE CHOLANGIOGRAM;  Surgeon: Shann Medal, MD;  Location: [facility];  Service: General;  Laterality: N/A;  . diverticulitis    . ERCP N/A 12/23/2012   Procedure: ENDOSCOPIC RETROGRADE CHOLANGIOPANCREATOGRAPHY (ERCP);  Surgeon: Jeryl Columbia, MD;  Location: [facility];  Service: Endoscopy;  Laterality: N/A;  . HEMORRHOID SURGERY  03/20/12   internal  . HERNIA REPAIR      Family History  Problem Relation Age of Onset  . Cancer Mother     breast  . Hypertension Father   . Diabetes Brother     No Known Allergies  Current Outpatient Prescriptions on File Prior to Visit  Medication Sig Dispense Refill  . aspirin EC 81 MG tablet Take 81 mg by mouth every morning.     Marland Kitchen atorvastatin (LIPITOR) 40 MG tablet Take 40 mg by mouth every evening.     . calcium-vitamin D (OSCAL WITH D) 500-200 MG-UNIT per tablet Take 1 tablet by mouth every morning.    . ergocalciferol (VITAMIN D2) 50000 UNITS capsule Take 50,000 Units by mouth once a week. Thursday    . insulin glargine (LANTUS) 100 UNIT/ML injection Inject 25 Units into the skin  at bedtime.     . INVOKANA 300 MG TABS Take 1 tablet by mouth every morning.     . metFORMIN (GLUCOPHAGE) 1000 MG tablet Take 1,000 mg by mouth 2 (two) times daily with a meal.    . pioglitazone (ACTOS) 45 MG tablet Take 45 mg by mouth every morning.     . sitaGLIPtin (JANUVIA) 100 MG tablet Take 100 mg by mouth every morning.     . valsartan (DIOVAN) 320 MG tablet Take 320 mg by mouth every morning.      No current facility-administered medications on file prior to visit.     BP 120/68 (BP Location: Left Arm, Patient Position: Sitting, Cuff Size: Normal)   Pulse 67   Temp 98.5 F (36.9 C) (Oral)   Ht 5' 5.5" (1.664 m)   Wt 169 lb (76.7 kg)   SpO2 97%   BMI 27.70 kg/m   1. Risk factors, based on past  M,S,F history-cardiovascular risk  factors include hypertension, and diabetes.  He is on statin therapy  2.  Physical activities: Quite active participates in half court basketball  3.  Depression/mood: No history of depression or mood disorder  4.  Hearing: No deficits  5.  ADL's: Totally independent  6.  Fall risk: Low  7.  Home safety: No problems identified  8.  Height weight, and visual acuity; height and weight stable.  No change in visual acuity eye examination scheduled in August  9.  Counseling: Regular.  Exercise moderate weight loss encouraged  10. Lab orders based on risk factors: Laboratory profile reviewed.  Hemoglobin A1c 6.7  11. Referral : Followup for endocrinology and urology and ophthalmology   12. Care plan: Heart healthy diet, modest weight loss recommended  13. Cognitive assessment: Alert, in order with normal affect.  No cognitive dysfunction  14.  Preventive services will include annual health assessments with screening lab.  He will be seen by ophthalmology annually urology annually.  We'll have colonoscopies performed at 5 years intervals.  Due to his history of colonic polyps.Patient was provided with a written and personalized care plan   15.   Provider list includes primary care urology, ophthalmology and GI as well as endocrinology         Review of Systems  Constitutional: Negative for activity change, appetite change, chills, fatigue and fever.  HENT: Negative for congestion, dental problem, ear pain, hearing loss, mouth sores, rhinorrhea, sinus pressure, sneezing, tinnitus, trouble swallowing and voice change.   Eyes: Negative for photophobia, pain, redness and visual disturbance.  Respiratory: Negative for apnea, cough, choking, chest tightness, shortness of breath and wheezing.   Cardiovascular: Negative for chest pain, palpitations and leg swelling.  Gastrointestinal: Negative for abdominal distention, abdominal pain, anal bleeding, blood in stool, constipation, diarrhea,  nausea, rectal pain and vomiting.  Genitourinary: Negative for decreased urine volume, difficulty urinating, discharge, dysuria, flank pain, frequency, genital sores, hematuria, penile swelling, scrotal swelling, testicular pain and urgency.  Musculoskeletal: Negative for arthralgias, back pain, gait problem, joint swelling, myalgias, neck pain and neck stiffness.  Skin: Negative for color change, rash and wound.  Neurological: Negative for dizziness, tremors, seizures, syncope, facial asymmetry, speech difficulty, weakness, light-headedness, numbness and headaches.  Hematological: Negative for adenopathy. Does not bruise/bleed easily.  Psychiatric/Behavioral: Negative for agitation, behavioral problems, confusion, decreased concentration, dysphoric mood, hallucinations, self-injury, sleep disturbance and suicidal ideas. The patient is not nervous/anxious.        Objective:   Physical Exam  Constitutional: He  is oriented to person, place, and time. He appears well-developed.  HENT:  Head: Normocephalic.  Right Ear: External ear normal.  Left Ear: External ear normal.  Eyes: Conjunctivae and EOM are normal.  Neck: Normal range of motion.  Cardiovascular: Normal rate and normal heart sounds.   Pulmonary/Chest: Breath sounds normal.  Abdominal: Bowel sounds are normal.  Few scattered lipoma in the abdominal wall region  Musculoskeletal: Normal range of motion. He exhibits no edema or tenderness.  Neurological: He is alert and oriented to person, place, and time.  Psychiatric: He has a normal mood and affect. His behavior is normal.          Assessment & Plan:   Preventive health examination Diabetes mellitus. Followup endocrinology.  Suboptimal control Hypertension stable.  Blood pressures often low normal, especially after exercise.   Dyslipidemia.  Continue statin therapy History colonic polyps.   BPH with history of elevated PSA. Followup urology  Recheck 1  year  Nyoka Cowden, MD

## 2015-09-10 LAB — HM DIABETES EYE EXAM

## 2015-09-16 ENCOUNTER — Encounter: Payer: Self-pay | Admitting: Internal Medicine

## 2015-09-23 LAB — PSA: PSA: 7.17

## 2015-11-30 ENCOUNTER — Encounter: Payer: Self-pay | Admitting: Family Medicine

## 2015-11-30 ENCOUNTER — Ambulatory Visit (INDEPENDENT_AMBULATORY_CARE_PROVIDER_SITE_OTHER): Payer: Medicare Other | Admitting: Family Medicine

## 2015-11-30 ENCOUNTER — Telehealth: Payer: Self-pay | Admitting: *Deleted

## 2015-11-30 VITALS — BP 148/80 | HR 68 | Temp 97.9°F | Wt 172.0 lb

## 2015-11-30 DIAGNOSIS — N3090 Cystitis, unspecified without hematuria: Secondary | ICD-10-CM

## 2015-11-30 DIAGNOSIS — R319 Hematuria, unspecified: Secondary | ICD-10-CM | POA: Diagnosis not present

## 2015-11-30 LAB — POC URINALSYSI DIPSTICK (AUTOMATED)
NITRITE UA: NEGATIVE
PH UA: 8.5
Spec Grav, UA: <=1.005
Urobilinogen, UA: 4

## 2015-11-30 MED ORDER — CEPHALEXIN 500 MG PO CAPS: 500.0000 mg | ORAL_CAPSULE | Freq: Four times a day (QID) | ORAL | 0 refills | Status: DC

## 2015-11-30 NOTE — Progress Notes (Signed)
Subjective:    Patient ID: Kenneth Little, male    DOB: 1946/01/08, 70 y.o.   MRN: [id number] HPI  Kenneth Little is a 70 year old male who presents today with blood tinged urine that occurred after doing yard work for 2 days. Associated frequency is noted.  He denies burning, fever, chills, sweats, N/V, abdominal pain, back pain, or flank pain.  He is followed by urology for BPH, history of hematuria and elevated PSA with his last appointment one month ago.  He experienced same symptoms in 01/2015 and was treated and provided an urgent urology referral. He reports doing well and urology referral indicated that hematuria was related to cystitis. No treatments have been tried at home.  Blood sugar monitoring at home. Fasting averages in the 80s to 90s.  He is followed by endocrinology every 3 months.  Review of Systems  Constitutional: Negative for chills, fatigue and fever.  Respiratory: Negative for cough, shortness of breath and wheezing.   Cardiovascular: Negative for chest pain and palpitations.  Gastrointestinal: Negative for abdominal pain, diarrhea, nausea and vomiting.  Genitourinary: Positive for frequency and hematuria. Negative for dysuria and urgency.  Musculoskeletal: Negative for back pain.  Skin: Negative for rash.   Past Medical History:  Diagnosis Date  . Diabetes mellitus without complication ([place])   . Hyperlipidemia   . Hypertension   . UTI (lower urinary tract infection)      Social History   Social History  . Marital status: Married    Spouse name: N/A  . Number of children: N/A  . Years of education: N/A   Occupational History  . Not on file.   Social History Main Topics  . Smoking status: Former Smoker    Types: Cigarettes    Quit date: 11/20/1988  . Smokeless tobacco: Never Used  . Alcohol use No  . Drug use: No  . Sexual activity: Yes   Other Topics Concern  . Not on file   Social History Narrative  . No narrative on file    Past Surgical History:    Procedure Laterality Date  . ABDOMINAL SURGERY    . CHOLECYSTECTOMY N/A 12/24/2012   Procedure: LAPAROSCOPIC CHOLECYSTECTOMY WITH INTRAOPERATIVE CHOLANGIOGRAM;  Surgeon: Kenneth Medal, MD;  Location: [facility];  Service: General;  Laterality: N/A;  . diverticulitis    . ERCP N/A 12/23/2012   Procedure: ENDOSCOPIC RETROGRADE CHOLANGIOPANCREATOGRAPHY (ERCP);  Surgeon: Kenneth Columbia, MD;  Location: [facility];  Service: Endoscopy;  Laterality: N/A;  . HEMORRHOID SURGERY  03/20/12   internal  . HERNIA REPAIR      Family History  Problem Relation Age of Onset  . Cancer Mother     breast  . Hypertension Father   . Diabetes Brother     No Known Allergies  Current Outpatient Prescriptions on File Prior to Visit  Medication Sig Dispense Refill  . aspirin EC 81 MG tablet Take 81 mg by mouth every morning.     Marland Kitchen atorvastatin (LIPITOR) 40 MG tablet Take 40 mg by mouth every evening.     . calcium-vitamin D (OSCAL WITH D) 500-200 MG-UNIT per tablet Take 1 tablet by mouth every morning.    . ergocalciferol (VITAMIN D2) 50000 UNITS capsule Take 50,000 Units by mouth once a week. Thursday    . insulin glargine (LANTUS) 100 UNIT/ML injection Inject 25 Units into the skin at bedtime.     . INVOKANA 300 MG TABS Take 1 tablet by mouth every morning.     Marland Kitchen  metFORMIN (GLUCOPHAGE) 1000 MG tablet Take 1,000 mg by mouth 2 (two) times daily with a meal.    . ONETOUCH VERIO test strip     . pioglitazone (ACTOS) 45 MG tablet Take 45 mg by mouth every morning.     . sitaGLIPtin (JANUVIA) 100 MG tablet Take 100 mg by mouth every morning.     . valsartan (DIOVAN) 320 MG tablet Take 320 mg by mouth every morning.      No current facility-administered medications on file prior to visit.     BP (!) 148/80 (BP Location: Left Arm, Patient Position: Sitting, Cuff Size: Normal)   Pulse 68   Temp 97.9 F (36.6 C) (Oral)   Wt 172 lb (78 kg)   SpO2 97%   BMI 28.19 kg/m        Objective:   Physical Exam   Constitutional: He is oriented to person, place, and time. He appears well-developed and well-nourished.  Eyes: Pupils are equal, round, and reactive to light. No scleral icterus.  Neck: Neck supple.  Pulmonary/Chest: Effort normal and breath sounds normal. He has no wheezes. He has no rales.  Abdominal: Soft. There is no tenderness. There is no CVA tenderness.  No suprapubic tenderness present today  Lymphadenopathy:    He has no cervical adenopathy.  Neurological: He is alert and oriented to person, place, and time.  Skin: Skin is warm and dry. No rash noted.          Assessment & Plan:  1. Cystitis UA 3+ Leukocytes, 3+ blood, and negative for nitrites.  Treat with cephalexin and will culture urine.  Advised follow up with PCP if symptoms do not improve in 2 to 3 days, worsen, or he develops a fever >100. - cephALEXin (KEFLEX) 500 MG capsule; Take 1 capsule (500 mg total) by mouth 4 (four) times daily.  Dispense: 40 capsule; Refill: 0  2. Hematuria, unspecified type  - POCT Urinalysis Dipstick (Automated)  Kenneth Metz, FNP-C

## 2015-11-30 NOTE — Addendum Note (Signed)
Addended by: Wyvonne Lenz on: 11/30/2015 11:06 AM   Modules accepted: Orders

## 2015-11-30 NOTE — Patient Instructions (Signed)
Please take medication as directed and follow up with your provider if symptoms do not improve in 2 to 3 days, worsen, or you develop a fever >101.   Urinary Tract Infection, Adult A urinary tract infection (UTI) is an infection of any part of the urinary tract, which includes the kidneys, ureters, bladder, and urethra. These organs make, store, and get rid of urine in the body. UTI can be a bladder infection (cystitis) or kidney infection (pyelonephritis). What are the causes? This infection may be caused by fungi, viruses, or bacteria. Bacteria are the most common cause of UTIs. This condition can also be caused by repeated incomplete emptying of the bladder during urination. What increases the risk? This condition is more likely to develop if:  You ignore your need to urinate or hold urine for long periods of time.  You do not empty your bladder completely during urination.  You wipe back to front after urinating or having a bowel movement, if you are male.  You are uncircumcised, if you are male.  You are constipated.  You have a urinary catheter that stays in place (indwelling).  You have a weak defense (immune) system.  You have a medical condition that affects your bowels, kidneys, or bladder.  You have diabetes.  You take antibiotic medicines frequently or for long periods of time, and the antibiotics no longer work well against certain types of infections (antibiotic resistance).  You take medicines that irritate your urinary tract.  You are exposed to chemicals that irritate your urinary tract.  You are male. What are the signs or symptoms? Symptoms of this condition include:  Fever.  Frequent urination or passing small amounts of urine frequently.  Needing to urinate urgently.  Pain or burning with urination.  Urine that smells bad or unusual.  Cloudy urine.  Pain in the lower abdomen or back.  Trouble urinating.  Blood in the urine.  Vomiting or  being less hungry than normal.  Diarrhea or abdominal pain.  Vaginal discharge, if you are male. How is this diagnosed? This condition is diagnosed with a medical history and physical exam. You will also need to provide a urine sample to test your urine. Other tests may be done, including:  Blood tests.  Sexually transmitted disease (STD) testing. If you have had more than one UTI, a cystoscopy or imaging studies may be done to determine the cause of the infections. How is this treated? Treatment for this condition often includes a combination of two or more of the following:  Antibiotic medicine.  Other medicines to treat less common causes of UTI.  Over-the-counter medicines to treat pain.  Drinking enough water to stay hydrated. Follow these instructions at home:  Take over-the-counter and prescription medicines only as told by your health care provider.  If you were prescribed an antibiotic, take it as told by your health care provider. Do not stop taking the antibiotic even if you start to feel better.  Avoid alcohol, caffeine, tea, and carbonated beverages. They can irritate your bladder.  Drink enough fluid to keep your urine clear or pale yellow.  Keep all follow-up visits as told by your health care provider. This is important.  Make sure to:  Empty your bladder often and completely. Do not hold urine for long periods of time.  Empty your bladder before and after sex.  Wipe from front to back after a bowel movement if you are male. Use each tissue one time when you wipe. Contact  a health care provider if:  You have back pain.  You have a fever.  You feel nauseous or vomit.  Your symptoms do not get better after 3 days.  Your symptoms go away and then return. Get help right away if:  You have severe back pain or lower abdominal pain.  You are vomiting and cannot keep down any medicines or water. This information is not intended to replace advice  given to you by your health care provider. Make sure you discuss any questions you have with your health care provider. Document Released: 10/06/2004 Document Revised: 06/10/2015 Document Reviewed: 11/17/2014 Elsevier Interactive Patient Education  2017 [employer].

## 2015-11-30 NOTE — Telephone Encounter (Signed)
FYI, Pt scheduled today to see [place] Signs to day at 10:00.  PLEASE NOTE: All timestamps contained within this report are represented as [place] Standard Time. CONFIDENTIALTY NOTICE: This fax transmission is intended only for the addressee. It contains information that is legally privileged, confidential or otherwise protected from use or disclosure. If you are not the intended recipient, you are strictly prohibited from reviewing, disclosing, copying using or disseminating any of this information or taking any action in reliance on or regarding this information. If you have received this fax in error, please notify us immediately by telephone so that we can arrange for its return to [place]. Phone: [phone], Toll-Free: [phone], Fax: [phone] Page: 1 of 2 Call Id: [id number] [facility] Night - Client [place] Patient Name: Kenneth Little Gender: Male DOB: Sep 22, 1945 Age: 70 Y 6 D Return Phone Number: [phone] (Primary), [phone] (Secondary) Address: City/State/Zip: [place] [address] Client [facility] Night - Client Client [facility] - Night Physician Simonne Martinet - MD Contact Type Call Who Is Calling Patient / Member / Family / Caregiver Call Type Triage / Clinical Caller Name [name] Mcvicar-balfour Relationship To Patient Daughter Return Phone Number [phone] (Secondary) Chief Complaint Urine - unusual color Reason for Call Symptomatic / Request for [place] says, last night and today, father's urine was pink -- UTI in the past, Sx was blood in urine PreDisposition Go to Urgent Care/Walk-In Clinic Translation No Nurse Assessment Nurse: Cox, RN, [place] Date/Time (Eastern Time): 11/29/2015 4:38:01 PM Confirm and document reason for call. If symptomatic, describe symptoms. You must click the next button to save  text entered. ---Caller states father has blood in urine, occurred one year ago with UTI. has enlarged prostate Has the patient traveled out of the country within the last 30 days? ---No Does the patient have any new or worsening symptoms? ---Yes Will a triage be completed? ---Yes Related visit to physician within the last 2 weeks? ---Yes Does the PT have any chronic conditions? (i.e. diabetes, asthma, etc.) ---Yes List chronic conditions. ---high cholesterol, HTN, DM Is this a behavioral health or substance abuse call? ---No Guidelines Guideline Title Affirmed Question Affirmed Notes Nurse Date/Time (Eastern Time) Urine - Blood In Blood in urine, but all triage questions negative (Exception: could be normal menstrual bleeding) Cox, RN, [place] [id number] PM Disp. Time Eilene Ghazi Time) Disposition Final User 11/29/2015 3:07:16 PM Send To Extended Follow Up Manson Allan, RN, Melissa PLEASE NOTE: All timestamps contained within this report are represented as [place] Standard Time. CONFIDENTIALTY NOTICE: This fax transmission is intended only for the addressee. It contains information that is legally privileged, confidential or otherwise protected from use or disclosure. If you are not the intended recipient, you are strictly prohibited from reviewing, disclosing, copying using or disseminating any of this information or taking any action in reliance on or regarding this information. If you have received this fax in error, please notify us immediately by telephone so that we can arrange for its return to [place]. Phone: [phone], Toll-Free: [phone], Fax: [phone] Page: 2 of 2 Call Id: [id number] [place]. Time Eilene Ghazi Time) Disposition Final User 11/29/2015 3:56:43 PM Send To Call Back Waiting For Nurse Roosevelt Locks 11/29/2015 3:57:07 PM Send To Call Back Waiting For Nurse Roosevelt Locks 11/29/2015 4:42:35 PM See Physician within 24 Hours Yes Cox, RN, Media planner  Understands: Yes Disagree/Comply: Comply Care Advice Given Per Guideline SEE PHYSICIAN WITHIN 24 HOURS: *  IF OFFICE WILL BE OPEN: You need to be seen within the next 24 hours. Call your doctor when the office opens, and make an appointment. CALL BACK IF: * You become worse. CARE ADVICE given per Urine, Blood In (Adult) guideline. Referrals REFERRED TO PCP OFFICE

## 2015-11-30 NOTE — Progress Notes (Signed)
Pre visit review using our clinic review tool, if applicable. No additional management support is needed unless otherwise documented below in the visit note. 

## 2015-12-01 NOTE — Telephone Encounter (Signed)
Patient was seen by Delano Metz FNP yesterday.

## 2015-12-03 LAB — URINE CULTURE

## 2016-01-06 ENCOUNTER — Ambulatory Visit (INDEPENDENT_AMBULATORY_CARE_PROVIDER_SITE_OTHER): Payer: Medicare Other | Admitting: Adult Health

## 2016-01-06 ENCOUNTER — Encounter: Payer: Self-pay | Admitting: Adult Health

## 2016-01-06 VITALS — BP 136/78 | HR 82 | Temp 98.2°F | Wt 173.7 lb

## 2016-01-06 DIAGNOSIS — R300 Dysuria: Secondary | ICD-10-CM

## 2016-01-06 LAB — POCT URINALYSIS DIPSTICK
BILIRUBIN UA: NEGATIVE
Ketones, UA: NEGATIVE
NITRITE UA: POSITIVE
PH UA: 6
Urobilinogen, UA: 0.2

## 2016-01-06 MED ORDER — AMOXICILLIN-POT CLAVULANATE 875-125 MG PO TABS: 1.0000 | ORAL_TABLET | Freq: Two times a day (BID) | ORAL | 0 refills | Status: DC

## 2016-01-06 NOTE — Progress Notes (Signed)
Pre visit review using our clinic review tool, if applicable. No additional management support is needed unless otherwise documented below in the visit note. 

## 2016-01-06 NOTE — Progress Notes (Signed)
Subjective:    Patient ID: Kenneth Little, male    DOB: 1945-09-27, 70 y.o.   MRN: [id number]  Urinary Tract Infection   This is a recurrent problem. The current episode started in the past 7 days (6 days ). The problem occurs every urination. The maximum temperature recorded prior to his arrival was 100 - 100.9 F. The fever has been present for 1 - 2 days. Associated symptoms include chills, frequency, hematuria and urgency. He has tried acetaminophen for the symptoms.   He was seen by Almyra Free, NP on 11/20 /2017 with UTI type symptoms. He was prescribed Keflex QID x 10 days. Culture showed Group B strep 10,000-50,000.  He reports feeling worse today then he did when he saw Almyra Free.   He denies any history of kidney stones.   He does have a urologist who he sees for hematuria.   Review of Systems  Constitutional: Positive for chills and fatigue.  Gastrointestinal: Negative.   Genitourinary: Positive for dysuria, frequency, hematuria and urgency.  Skin: Negative.    Past Medical History:  Diagnosis Date  . Diabetes mellitus without complication ([facility])   . Hyperlipidemia   . Hypertension   . UTI (lower urinary tract infection)     Social History   Social History  . Marital status: Married    Spouse name: N/A  . Number of children: N/A  . Years of education: N/A   Occupational History  . Not on file.   Social History Main Topics  . Smoking status: Former Smoker    Types: Cigarettes    Quit date: 11/20/1988  . Smokeless tobacco: Never Used  . Alcohol use No  . Drug use: No  . Sexual activity: Yes   Other Topics Concern  . Not on file   Social History Narrative  . No narrative on file    Past Surgical History:  Procedure Laterality Date  . ABDOMINAL SURGERY    . CHOLECYSTECTOMY N/A 12/24/2012   Procedure: LAPAROSCOPIC CHOLECYSTECTOMY WITH INTRAOPERATIVE CHOLANGIOGRAM;  Surgeon: Shann Medal, MD;  Location: [facility];  Service: General;  Laterality: N/A;  . diverticulitis     . ERCP N/A 12/23/2012   Procedure: ENDOSCOPIC RETROGRADE CHOLANGIOPANCREATOGRAPHY (ERCP);  Surgeon: Jeryl Columbia, MD;  Location: [facility];  Service: Endoscopy;  Laterality: N/A;  . HEMORRHOID SURGERY  03/20/12   internal  . HERNIA REPAIR      Family History  Problem Relation Age of Onset  . Cancer Mother     breast  . Hypertension Father   . Diabetes Brother     No Known Allergies  Current Outpatient Prescriptions on File Prior to Visit  Medication Sig Dispense Refill  . aspirin EC 81 MG tablet Take 81 mg by mouth every morning.     Marland Kitchen atorvastatin (LIPITOR) 40 MG tablet Take 40 mg by mouth every evening.     . calcium-vitamin D (OSCAL WITH D) 500-200 MG-UNIT per tablet Take 1 tablet by mouth every morning.    . cephALEXin (KEFLEX) 500 MG capsule Take 1 capsule (500 mg total) by mouth 4 (four) times daily. 40 capsule 0  . ergocalciferol (VITAMIN D2) 50000 UNITS capsule Take 50,000 Units by mouth once a week. Thursday    . insulin glargine (LANTUS) 100 UNIT/ML injection Inject 25 Units into the skin at bedtime.     . INVOKANA 300 MG TABS Take 1 tablet by mouth every morning.     . metFORMIN (GLUCOPHAGE) 1000 MG tablet Take  1,000 mg by mouth 2 (two) times daily with a meal.    . ONETOUCH VERIO test strip     . pioglitazone (ACTOS) 45 MG tablet Take 45 mg by mouth every morning.     . sitaGLIPtin (JANUVIA) 100 MG tablet Take 100 mg by mouth every morning.     . valsartan (DIOVAN) 320 MG tablet Take 320 mg by mouth every morning.      No current facility-administered medications on file prior to visit.     BP 136/78 (BP Location: Left Arm, Patient Position: Sitting, Cuff Size: Normal)   Pulse 82   Temp 98.2 F (36.8 C) (Oral)   Wt 173 lb 11.2 oz (78.8 kg)   BMI 28.47 kg/m       Objective:   Physical Exam  Constitutional: He is oriented to person, place, and time. He appears well-developed and well-nourished. No distress.  Cardiovascular: Normal rate, regular rhythm, normal  heart sounds and intact distal pulses.  Exam reveals no gallop and no friction rub.   No murmur heard. Pulmonary/Chest: Effort normal and breath sounds normal. No respiratory distress. He has no wheezes. He has no rales. He exhibits no tenderness.  Abdominal: Soft. Bowel sounds are normal. He exhibits no distension and no mass. There is no hepatosplenomegaly, splenomegaly or hepatomegaly. There is no tenderness. There is no rebound, no guarding and no CVA tenderness.  Musculoskeletal: Normal range of motion. He exhibits no edema, tenderness or deformity.  Neurological: He is alert and oriented to person, place, and time.  Skin: Skin is warm and dry. No rash noted. He is not diaphoretic. No erythema. No pallor.  Psychiatric: He has a normal mood and affect. His behavior is normal. Judgment and thought content normal.  Nursing note and vitals reviewed.     Assessment & Plan:  1. Dysuria - Will treat with Augmentin due to previous Urine Culture results.  - Advised to follow up with Urology  - POCT urinalysis dipstick - Culture, Urine - amoxicillin-clavulanate (AUGMENTIN) 875-125 MG tablet; Take 1 tablet by mouth 2 (two) times daily.  Dispense: 20 tablet; Refill: 0  Dorothyann Peng, NP

## 2016-01-08 LAB — URINE CULTURE

## 2016-02-24 ENCOUNTER — Encounter: Payer: Self-pay | Admitting: Internal Medicine

## 2016-03-03 ENCOUNTER — Other Ambulatory Visit: Payer: Self-pay | Admitting: Urology

## 2016-03-15 ENCOUNTER — Encounter (HOSPITAL_BASED_OUTPATIENT_CLINIC_OR_DEPARTMENT_OTHER): Payer: Self-pay | Admitting: *Deleted

## 2016-03-15 NOTE — Progress Notes (Signed)
To [facility] at 0730- Istat,Ekg on arrival-Instructed Npo after Mn-Will take 14 units lantus insulin evening prior.

## 2016-03-21 NOTE — H&P (Signed)
CC: I have blood in my urine.  HPI: Kenneth Little is a 71 year-old male established patient who is here for blood in the urine.  He did see the blood in his urine. He first noticed the symptoms 01/27/2015.   He does not have a burning sensation when he urinates. He is not currently having trouble urinating.   He is not having pain.   His last U/S or CT Scan was 01/26/2016.   Kenneth Little returns today in f/u for his history of gross hematuria. He has had 3 episodes in the past year but none since his most recent visit with [name]. He had a CT hematuria study and that showed only a very large prostate. He was given Alfuzosin and is doing better with reduced urgency and nocturia x 1. He has no associated signs or symptoms.     ALLERGIES: No Allergies    MEDICATIONS: Uroxatral 10 mg tablet, extended release 24 hr 1 tablet PO Q HS  Alfuzosin Hcl Er 10 mg tablet, extended release 24 hr  Aspirin 81 MG TABS Oral  Invokana 300 MG Oral Tablet Oral  Januvia 100 MG Oral Tablet Oral  Lantus 100 UNIT/ML Subcutaneous Solution Subcutaneous  Lipitor 40 MG Oral Tablet Oral  MetFORMIN HCl - 1000 MG Oral Tablet Oral  Pioglitazone HCl - 30 MG Oral Tablet Oral  Sildenafil 20 mg tablet take 1-5 tablets as needed  Vitamin D 50000 UNIT CAPS Oral     GU PSH: Locm 300-399Mg /Ml Iodine,1Ml - 01/26/2016 Prostate Needle Biopsy - 2010      PSH Notes: Cholecystectomy, Hemorrhoidectomy, Biopsy Of The Prostate Needle, Partial Colectomy, Inguinal Hernia Repair   NON-GU PSH: Cholecystectomy - 2015 Colon Resection - 2008 Ligate Hemorrhoid(s) - 2014    GU PMH: BPH w/LUTS, Benign prostatic hyperplasia with urinary obstruction - 03/25/2015 ED, arterial insufficiency, Erectile dysfunction due to arterial insufficiency - 03/25/2015 Gross hematuria, Gross hematuria - 03/25/2015 Urinary Urgency, Urinary urgency - 03/25/2015 Elevated PSA, Elevated prostate specific antigen (PSA) - 07/31/2014 Hemorrhoids, Unspec, Hemorrhoids With  Bleeding - 2014 Nodular prostate w/o LUTS, Prostate Hard Area Or Nodule - 2014    NON-GU PMH: Encounter for general adult medical examination without abnormal findings, Encounter for preventive health examination - 02/02/2015 Diverticulosis of large intestine without perforation or abscess without bleeding, Colonic Diverticulosis - 2014 Personal history of other diseases of the circulatory system, History of hypertension - 2014 Personal history of other endocrine, nutritional and metabolic disease, History of hypercholesterolemia - 2014, History of diabetes mellitus, - 2014 Sepsis due to Escherichia coli [E. coli], Escherichia Coli Septicemia - 2014    FAMILY HISTORY: Family Health Status Number - Runs In Family   SOCIAL HISTORY: Marital Status: Married Current Smoking Status: Patient does not smoke anymore. Has not smoked since 01/10/1986.   Tobacco Use Assessment Completed: Used Tobacco in last 30 days? Does not drink anymore.  Drinks 2 caffeinated drinks per day.     Notes: Former smoker, Tobacco Use, Caffeine Use, Marital History - Currently Married, Alcohol Use, Occupation: His wife has been diagnosed with Alzhiemer's.    REVIEW OF SYSTEMS:    GU Review Male:   Patient reports frequent urination and hard to postpone urination. Patient denies burning/ pain with urination, get up at night to urinate, leakage of urine, stream starts and stops, trouble starting your stream, have to strain to urinate , erection problems, and penile pain.  Gastrointestinal (Upper):   Patient denies nausea, indigestion/ heartburn, and vomiting.  Gastrointestinal (Lower):  Patient denies diarrhea and constipation.  Constitutional:   Patient denies fever, night sweats, weight loss, and fatigue.  Skin:   Patient denies skin rash/ lesion and itching.  Eyes:   Patient denies blurred vision and double vision.  Ears/ Nose/ Throat:   Patient denies sore throat and sinus problems.  Hematologic/Lymphatic:   Patient  denies swollen glands and easy bruising.  Cardiovascular:   Patient denies leg swelling and chest pains.  Respiratory:   Patient reports cough. Patient denies shortness of breath.  Endocrine:   Patient denies excessive thirst.  Musculoskeletal:   Patient denies back pain and joint pain.  Neurological:   Patient denies headaches and dizziness.  Psychologic:   Patient denies depression and anxiety.   VITAL SIGNS:      02/15/2016 10:30 AM  BP 131/71 mmHg  Pulse 68 /min  Temperature 97.6 F / 36 C   PAST DATA REVIEWED:  Source Of History:  Patient  Urine Test Review:   Urinalysis  X-Ray Review: C.T. Hematuria: Reviewed Films. Reviewed Report. Discussed With Patient. large prostate but otherwise negative.     09/23/15 07/15/13 06/07/12 05/04/11 11/03/10 10/22/09 04/29/08 07/03/07  PSA  Total PSA 7.17  7.56  4.40  6.05  6.06  9.50  9.82  11.60   Free PSA 2.61   1.24  1.58  1.20  2.47  2.23    % Free PSA 36   28  26  20   26.0  22.7      PROCEDURES:         Flexible Cystoscopy - 52000  Risks, benefits, and some of the potential complications of the procedure were discussed. 68ml of 2% lidocaine jelly was instilled intraurethrally.  Cipro 500mg  given for antibiotic prophylaxis.     Meatus:  Normal size. Normal location. Normal condition.  Urethra:  No strictures.  External Sphincter:  Normal.  Verumontanum:  Normal.  Prostate:  Obstructing. Moderate hyperplasia. 5-6cm. Prominent veins on the right bladder neck.   Bladder Neck:  Non-obstructing.  Ureteral Orifices:  Normal location. Normal size. Normal shape. Effluxed clear urine.  Bladder:  Mild trabeculation. Erythematous mucosa on the dome that appears most consistent with healing inflammation. It is a hemorrhagic lesion. There is a small diverticulum on the right posterior wall as noted on CT. No tumors. No stones.      The procedure was well tolerated and there were no complications.  BPH with BOO and hemorrhagic lesion on the  dome that is most consistent with resolving inflammation.          Urinalysis Dipstick Dipstick Cont'd  Color: Yellow Bilirubin: Neg  Appearance: Clear Ketones: Neg  Specific Gravity: 1.020 Blood: Neg  pH: <=5.0 Protein: Neg  Glucose: 2+ Urobilinogen: 0.2    Nitrites: Neg    Leukocyte Esterase: Neg    ASSESSMENT:      ICD-10 Details  1 GU:   Gross hematuria - R31.0 Improving - The hematuria has resolved. There is a healing hemorrhagic lesion on the dome that appears more inflammatory than neoplastic. I will get a cytology today and if that is negative he will just return in 3 months. If that is positive, I will set him up for a biopsy.   2   BPH w/LUTS - N40.1 Improving - He has a large obstructing prostate but is doing better on alfuzosin.   3   Urinary Urgency - R39.15 Improving - The urgency has improved on alfuzosin     PLAN:  Orders Labs Urine Cytology          Schedule Labs: 3 Months - Urinalysis  Return Visit/Planned Activity: 3 Months - Office Visit          Document Letter(s):  Created for Patient: Clinical Summary         Notes:   He has a small bladder lesion and BPH with BOO. He is voiding better on alfuzosin.   I will get a cytology and will biopsy if abnormal.   He might want to try to get off of the Actos since there has been an association with bladder cancer.   F/U in 3 months.   CC: Dr. Bluford Kaufmann and Dr. Legrand Como Altheimer.      Signed by Irine Seal, M.D. on 02/15/16 at 11:08 AM (EST)       APPENDED NOTES:  The cytology showed atypia so I am going to get him set up for a bladder biopsy.

## 2016-03-22 ENCOUNTER — Ambulatory Visit (HOSPITAL_BASED_OUTPATIENT_CLINIC_OR_DEPARTMENT_OTHER): Payer: Medicare Other | Admitting: Anesthesiology

## 2016-03-22 ENCOUNTER — Encounter (HOSPITAL_BASED_OUTPATIENT_CLINIC_OR_DEPARTMENT_OTHER)
Admission: RE | Disposition: A | Discharge status: Home / Self Care | Payer: Self-pay | Source: Ambulatory Visit | Attending: Urology

## 2016-03-22 ENCOUNTER — Encounter (HOSPITAL_BASED_OUTPATIENT_CLINIC_OR_DEPARTMENT_OTHER): Payer: Self-pay | Admitting: *Deleted

## 2016-03-22 ENCOUNTER — Ambulatory Visit (HOSPITAL_BASED_OUTPATIENT_CLINIC_OR_DEPARTMENT_OTHER)
Admission: RE | Admit: 2016-03-22 | Discharge: 2016-03-22 | Disposition: A | Discharge status: Home / Self Care | Payer: Medicare Other | Source: Ambulatory Visit | Attending: Urology | Admitting: Urology

## 2016-03-22 DIAGNOSIS — N138 Other obstructive and reflux uropathy: Secondary | ICD-10-CM | POA: Insufficient documentation

## 2016-03-22 DIAGNOSIS — Z79899 Other long term (current) drug therapy: Secondary | ICD-10-CM | POA: Insufficient documentation

## 2016-03-22 DIAGNOSIS — N5201 Erectile dysfunction due to arterial insufficiency: Secondary | ICD-10-CM | POA: Diagnosis not present

## 2016-03-22 DIAGNOSIS — Z9049 Acquired absence of other specified parts of digestive tract: Secondary | ICD-10-CM | POA: Diagnosis not present

## 2016-03-22 DIAGNOSIS — Z794 Long term (current) use of insulin: Secondary | ICD-10-CM | POA: Diagnosis not present

## 2016-03-22 DIAGNOSIS — R310 Gross hematuria: Secondary | ICD-10-CM | POA: Insufficient documentation

## 2016-03-22 DIAGNOSIS — Z7982 Long term (current) use of aspirin: Secondary | ICD-10-CM | POA: Diagnosis not present

## 2016-03-22 DIAGNOSIS — Z87891 Personal history of nicotine dependence: Secondary | ICD-10-CM | POA: Diagnosis not present

## 2016-03-22 DIAGNOSIS — E7800 Pure hypercholesterolemia, unspecified: Secondary | ICD-10-CM | POA: Insufficient documentation

## 2016-03-22 DIAGNOSIS — D414 Neoplasm of uncertain behavior of bladder: Secondary | ICD-10-CM | POA: Diagnosis not present

## 2016-03-22 DIAGNOSIS — N323 Diverticulum of bladder: Secondary | ICD-10-CM | POA: Diagnosis not present

## 2016-03-22 DIAGNOSIS — N401 Enlarged prostate with lower urinary tract symptoms: Secondary | ICD-10-CM | POA: Insufficient documentation

## 2016-03-22 DIAGNOSIS — I10 Essential (primary) hypertension: Secondary | ICD-10-CM | POA: Diagnosis not present

## 2016-03-22 DIAGNOSIS — R3915 Urgency of urination: Secondary | ICD-10-CM | POA: Insufficient documentation

## 2016-03-22 DIAGNOSIS — E119 Type 2 diabetes mellitus without complications: Secondary | ICD-10-CM | POA: Insufficient documentation

## 2016-03-22 DIAGNOSIS — N329 Bladder disorder, unspecified: Secondary | ICD-10-CM | POA: Diagnosis present

## 2016-03-22 HISTORY — PX: CYSTOSCOPY WITH BIOPSY: SHX5122

## 2016-03-22 LAB — POCT I-STAT 4, (NA,K, GLUC, HGB,HCT)
GLUCOSE: 120 mg/dL — AB (ref 65–99)
HCT: 42 % (ref 39.0–52.0)
Hemoglobin: 14.3 g/dL (ref 13.0–17.0)
POTASSIUM: 3.6 mmol/L (ref 3.5–5.1)
Sodium: 143 mmol/L (ref 135–145)

## 2016-03-22 LAB — GLUCOSE, CAPILLARY: GLUCOSE-CAPILLARY: 98 mg/dL (ref 65–99)

## 2016-03-22 SURGERY — CYSTOSCOPY, WITH BIOPSY
Anesthesia: General

## 2016-03-22 MED ORDER — KETOROLAC TROMETHAMINE 30 MG/ML IJ SOLN
30.0000 mg | Freq: Once | INTRAMUSCULAR | Status: DC | PRN
  Filled 2016-03-22: qty 1

## 2016-03-22 MED ORDER — CEFAZOLIN SODIUM-DEXTROSE 2-4 GM/100ML-% IV SOLN
2.0000 g | INTRAVENOUS | Status: AC
  Administered 2016-03-22: 2 g via INTRAVENOUS
  Filled 2016-03-22: qty 100

## 2016-03-22 MED ORDER — MIDAZOLAM HCL 2 MG/2ML IJ SOLN
INTRAMUSCULAR | Status: AC
  Filled 2016-03-22: qty 2

## 2016-03-22 MED ORDER — LIDOCAINE 2% (20 MG/ML) 5 ML SYRINGE
INTRAMUSCULAR | Status: AC
  Filled 2016-03-22: qty 5

## 2016-03-22 MED ORDER — ACETAMINOPHEN 650 MG RE SUPP
650.0000 mg | RECTAL | Status: DC | PRN
  Filled 2016-03-22: qty 1

## 2016-03-22 MED ORDER — SODIUM CHLORIDE 0.9% FLUSH
3.0000 mL | Freq: Two times a day (BID) | INTRAVENOUS | Status: DC
  Filled 2016-03-22: qty 3

## 2016-03-22 MED ORDER — DEXAMETHASONE SODIUM PHOSPHATE 4 MG/ML IJ SOLN
INTRAMUSCULAR | Status: DC | PRN
  Administered 2016-03-22: 10 mg via INTRAVENOUS

## 2016-03-22 MED ORDER — OXYCODONE HCL 5 MG/5ML PO SOLN
5.0000 mg | Freq: Once | ORAL | Status: DC | PRN
  Filled 2016-03-22: qty 5

## 2016-03-22 MED ORDER — ACETAMINOPHEN 325 MG PO TABS
650.0000 mg | ORAL_TABLET | ORAL | Status: DC | PRN
  Filled 2016-03-22: qty 2

## 2016-03-22 MED ORDER — DEXAMETHASONE SODIUM PHOSPHATE 10 MG/ML IJ SOLN
INTRAMUSCULAR | Status: AC
Start: 2016-03-22 — End: 2016-03-22
  Filled 2016-03-22: qty 1

## 2016-03-22 MED ORDER — OXYCODONE HCL 5 MG PO TABS
5.0000 mg | ORAL_TABLET | ORAL | Status: DC | PRN
  Filled 2016-03-22: qty 2

## 2016-03-22 MED ORDER — PROPOFOL 10 MG/ML IV BOLUS
INTRAVENOUS | Status: AC
  Filled 2016-03-22: qty 40

## 2016-03-22 MED ORDER — LACTATED RINGERS IV SOLN
INTRAVENOUS | Status: DC
  Administered 2016-03-22: 08:00:00 via INTRAVENOUS
  Filled 2016-03-22: qty 1000

## 2016-03-22 MED ORDER — CEFAZOLIN SODIUM-DEXTROSE 2-4 GM/100ML-% IV SOLN
INTRAVENOUS | Status: AC
  Filled 2016-03-22: qty 100

## 2016-03-22 MED ORDER — FENTANYL CITRATE (PF) 100 MCG/2ML IJ SOLN
INTRAMUSCULAR | Status: DC | PRN
  Administered 2016-03-22: 50 ug via INTRAVENOUS
  Administered 2016-03-22: 25 ug via INTRAVENOUS

## 2016-03-22 MED ORDER — FENTANYL CITRATE (PF) 100 MCG/2ML IJ SOLN
INTRAMUSCULAR | Status: AC
  Filled 2016-03-22: qty 2

## 2016-03-22 MED ORDER — FENTANYL CITRATE (PF) 100 MCG/2ML IJ SOLN
25.0000 ug | INTRAMUSCULAR | Status: DC | PRN
  Filled 2016-03-22: qty 1

## 2016-03-22 MED ORDER — EPHEDRINE SULFATE-NACL 50-0.9 MG/10ML-% IV SOSY
PREFILLED_SYRINGE | INTRAVENOUS | Status: DC | PRN
  Administered 2016-03-22 (×2): 10 mg via INTRAVENOUS

## 2016-03-22 MED ORDER — PROPOFOL 10 MG/ML IV BOLUS
INTRAVENOUS | Status: DC | PRN
  Administered 2016-03-22: 180 mg via INTRAVENOUS

## 2016-03-22 MED ORDER — OXYCODONE HCL 5 MG PO TABS
5.0000 mg | ORAL_TABLET | Freq: Once | ORAL | Status: DC | PRN
  Filled 2016-03-22: qty 1

## 2016-03-22 MED ORDER — EPHEDRINE 5 MG/ML INJ
INTRAVENOUS | Status: AC
Start: 2016-03-22 — End: 2016-03-22
  Filled 2016-03-22: qty 10

## 2016-03-22 MED ORDER — MIDAZOLAM HCL 5 MG/5ML IJ SOLN
INTRAMUSCULAR | Status: DC | PRN
  Administered 2016-03-22: 1 mg via INTRAVENOUS

## 2016-03-22 MED ORDER — ARTIFICIAL TEARS OP OINT
TOPICAL_OINTMENT | OPHTHALMIC | Status: AC
  Filled 2016-03-22: qty 3.5

## 2016-03-22 MED ORDER — SODIUM CHLORIDE 0.9% FLUSH
3.0000 mL | INTRAVENOUS | Status: DC | PRN
  Filled 2016-03-22: qty 3

## 2016-03-22 MED ORDER — LIDOCAINE 2% (20 MG/ML) 5 ML SYRINGE
INTRAMUSCULAR | Status: DC | PRN
  Administered 2016-03-22: 60 mg via INTRAVENOUS

## 2016-03-22 MED ORDER — STERILE WATER FOR IRRIGATION IR SOLN
Status: DC | PRN
  Administered 2016-03-22: 3000 mL

## 2016-03-22 MED ORDER — ONDANSETRON HCL 4 MG/2ML IJ SOLN
INTRAMUSCULAR | Status: DC | PRN
  Administered 2016-03-22: 4 mg via INTRAVENOUS

## 2016-03-22 MED ORDER — SODIUM CHLORIDE 0.9 % IV SOLN
250.0000 mL | INTRAVENOUS | Status: DC | PRN
  Filled 2016-03-22: qty 250

## 2016-03-22 MED ORDER — PROMETHAZINE HCL 25 MG/ML IJ SOLN
6.2500 mg | INTRAMUSCULAR | Status: DC | PRN
  Filled 2016-03-22: qty 1

## 2016-03-22 SURGICAL SUPPLY — 24 items
BAG DRAIN URO-CYSTO SKYTR STRL (DRAIN) ×2 IMPLANT
CATH FOLEY 2WAY SLVR  5CC 16FR (CATHETERS)
CATH FOLEY 2WAY SLVR 5CC 16FR (CATHETERS) IMPLANT
CLOTH BEACON ORANGE TIMEOUT ST (SAFETY) ×2 IMPLANT
ELECT REM PT RETURN 9FT ADLT (ELECTROSURGICAL) ×2
ELECTRODE REM PT RTRN 9FT ADLT (ELECTROSURGICAL) ×1 IMPLANT
GLOVE BIO SURGEON STRL SZ7 (GLOVE) ×2 IMPLANT
GLOVE BIOGEL PI IND STRL 7.0 (GLOVE) ×1 IMPLANT
GLOVE BIOGEL PI INDICATOR 7.0 (GLOVE) ×1
GLOVE SURG SS PI 8.0 STRL IVOR (GLOVE) ×2 IMPLANT
GOWN STRL REUS W/ TWL LRG LVL3 (GOWN DISPOSABLE) ×1 IMPLANT
GOWN STRL REUS W/ TWL XL LVL3 (GOWN DISPOSABLE) ×1 IMPLANT
GOWN STRL REUS W/TWL LRG LVL3 (GOWN DISPOSABLE) ×1
GOWN STRL REUS W/TWL XL LVL3 (GOWN DISPOSABLE) ×1
KIT RM TURNOVER CYSTO AR (KITS) ×2 IMPLANT
MANIFOLD NEPTUNE II (INSTRUMENTS) IMPLANT
NDL SAFETY ECLIPSE 18X1.5 (NEEDLE) IMPLANT
NEEDLE HYPO 18GX1.5 SHARP (NEEDLE)
NEEDLE HYPO 22GX1.5 SAFETY (NEEDLE) IMPLANT
NS IRRIG 500ML POUR BTL (IV SOLUTION) IMPLANT
PACK CYSTO (CUSTOM PROCEDURE TRAY) ×2 IMPLANT
SYR 20CC LL (SYRINGE) IMPLANT
TUBE CONNECTING 12X1/4 (SUCTIONS) IMPLANT
WATER STERILE IRR 3000ML UROMA (IV SOLUTION) ×2 IMPLANT

## 2016-03-22 NOTE — Brief Op Note (Signed)
03/22/2016  9:34 AM  PATIENT:  Kenneth Little  71 y.o. male  PRE-OPERATIVE DIAGNOSIS:  BLADDER LESION  POST-OPERATIVE DIAGNOSIS:  BLADDER LESION  PROCEDURE:  Procedure(s): CYSTOSCOPY WITH BLADDER  BIOPSY WITH FULGURATION (N/A)  SURGEON:  Surgeon(s) and Role:    * Irine Seal, MD - Primary  PHYSICIAN ASSISTANT:   ASSISTANTS: none   ANESTHESIA:   general  EBL:  Total I/O In: 200 [I.V.:200] Out: -   BLOOD ADMINISTERED:none  DRAINS: none   LOCAL MEDICATIONS USED:  NONE  SPECIMEN:  Source of Specimen:  bladder biopsy from dome and posterior wall  DISPOSITION OF SPECIMEN:  PATHOLOGY  COUNTS:  YES  TOURNIQUET:  * No tourniquets in log *  DICTATION: .Other Dictation: Dictation Number [id number]  PLAN OF CARE: Discharge to home after PACU  PATIENT DISPOSITION:  PACU - hemodynamically stable.   Delay start of Pharmacological VTE agent (>24hrs) due to surgical blood loss or risk of bleeding: not applicable

## 2016-03-22 NOTE — Op Note (Signed)
NAME:  Kenneth Little, Kenneth Little                         ACCOUNT NO.:  MEDICAL RECORD NO.:  [id number]  LOCATION:                                 FACILITY:  PHYSICIAN:  Marshall Cork. Jeffie Pollock, M.D.         DATE OF BIRTH:  DATE OF PROCEDURE:  03/22/2016 DATE OF DISCHARGE:                              OPERATIVE REPORT   The patient of Dr. Irine Seal.  PROCEDURE:  Cystoscopy with bladder biopsy and fulguration at 2 sites.  PREOPERATIVE DIAGNOSIS:  History of gross hematuria with bladder lesion and atypical cytology.  POSTOPERATIVE DIAGNOSIS:  History of gross hematuria with bladder lesion and atypical cytology.  SURGEON:  Marshall Cork. Jeffie Pollock, M.D.  ANESTHESIA:  General.  SPECIMEN:  Biopsies from the dome and posterior wall of the bladder.  DRAINS:  None.  BLOOD LOSS:  None.  COMPLICATIONS:  None.  INDICATIONS:  Mr. Royce is a 71 year old male with history of an episode of gross hematuria.  Office cystoscopy revealed a reddish lesion on the dome of the bladder.  He had a cytology that showed atypical cells and was felt that biopsy was indicated.  FINDINGS OF PROCEDURE:  He was taken to the operating room where he was given Ancef.  A general anesthetic was induced.  He was placed in lithotomy position.  His perineum and genitalia were prepped with Betadine solution.  He was draped in usual sterile fashion.  Cystoscopy was performed using the 23-French scope and 30-degree lens. Examination revealed a normal urethra.  The external sphincter was intact.  The prostatic urethra was approximately 4 cm in length with bilobar hyperplasia with coaptation of obstruction.  Examination of the bladder revealed mild-to-moderate trabeculation with a 2 cm diverticulum on the right dome and a smaller diverticulum anteriorly.  There was a very small approximately 2 mm hemorrhagic area on the dome of the bladder and there was a submucosal nodule on the posterior wall that was approximately 5 mm.  Ureteral orifices  were unremarkable, effluxing clear urine.  After initial cystoscopy was performed, biopsies were obtained from the lesion on the dome and from the posterior wall.  The biopsy sites were then fulgurated generously and the bladder was then drained.  The cystoscope was removed.  The patient was taken down from lithotomy position.  His anesthetic was reversed.  He was moved to the recovery room in stable condition.  There were no complications.     Marshall Cork. Jeffie Pollock, M.D.     JJW/MEDQ  D:  03/22/2016  T:  03/22/2016  Job:  [id number]

## 2016-03-22 NOTE — Discharge Instructions (Addendum)
CYSTOSCOPY HOME CARE INSTRUCTIONS ° °Activity: °Rest for the remainder of the day.  Do not drive or operate equipment today.  You may resume normal activities in one to two days as instructed by your physician.  ° °Meals: °Drink plenty of liquids and eat light foods such as gelatin or soup this evening.  You may return to a normal meal plan tomorrow. ° °Return to Work: °You may return to work in one to two days or as instructed by your physician. ° °Special Instructions / Symptoms: °Call your physician if any of these symptoms occur: ° ° -persistent or heavy bleeding ° -bleeding which continues after first few urination ° -large blood clots that are difficult to pass ° -urine stream diminishes or stops completely ° -fever equal to or higher than 101 degrees Farenheit. ° -cloudy urine with a strong, foul odor ° -severe pain ° °Females should always wipe from front to back after elimination.  You may feel some burning pain when you urinate.  This should disappear with time.  Applying moist heat to the lower abdomen or a hot tub bath may help relieve the pain. \ ° ° ° ° °Post Anesthesia Home Care Instructions ° °Activity: °Get plenty of rest for the remainder of the day. A responsible adult should stay with you for 24 hours following the procedure.  °For the next 24 hours, DO NOT: °-Drive a car °-Operate machinery °-Drink alcoholic beverages °-Take any medication unless instructed by your physician °-Make any legal decisions or sign important papers. ° °Meals: °Start with liquid foods such as gelatin or soup. Progress to regular foods as tolerated. Avoid greasy, spicy, heavy foods. If nausea and/or vomiting occur, drink only clear liquids until the nausea and/or vomiting subsides. Call your physician if vomiting continues. ° °Special Instructions/Symptoms: °Your throat may feel dry or sore from the anesthesia or the breathing tube placed in your throat during surgery. If this causes discomfort, gargle with warm salt  water. The discomfort should disappear within 24 hours. ° °If you had a scopolamine patch placed behind your ear for the management of post- operative nausea and/or vomiting: ° °1. The medication in the patch is effective for 72 hours, after which it should be removed.  Wrap patch in a tissue and discard in the trash. Wash hands thoroughly with soap and water. °2. You may remove the patch earlier than 72 hours if you experience unpleasant side effects which may include dry mouth, dizziness or visual disturbances. °3. Avoid touching the patch. Wash your hands with soap and water after contact with the patch. °  ° °

## 2016-03-22 NOTE — Anesthesia Postprocedure Evaluation (Addendum)
Anesthesia Post Note  Patient: Kenneth Little  Procedure(s) Performed: Procedure(s) (LRB): CYSTOSCOPY WITH BLADDER  BIOPSY WITH FULGURATION (N/A)  Patient location during evaluation: PACU Anesthesia Type: General Level of consciousness: awake and alert Pain management: pain level controlled Vital Signs Assessment: post-procedure vital signs reviewed and stable Respiratory status: spontaneous breathing, nonlabored ventilation, respiratory function stable and patient connected to nasal cannula oxygen Cardiovascular status: blood pressure returned to baseline and stable Postop Assessment: no signs of nausea or vomiting Anesthetic complications: no       Last Vitals:  Vitals:   03/22/16 1000 03/22/16 1015  BP: 132/67 139/71  Pulse: 74 74  Resp: 14 13  Temp:      Last Pain:  Vitals:   03/22/16 0714  TempSrc: Oral                 Rosemary Pentecost S

## 2016-03-22 NOTE — Anesthesia Preprocedure Evaluation (Signed)
Anesthesia Evaluation  Patient identified by MRN, date of birth, ID band Patient awake    Reviewed: Allergy & Precautions, NPO status , Patient's Chart, lab work & pertinent test results  Airway Mallampati: II  TM Distance: >3 FB Neck ROM: Full    Dental no notable dental hx.    Pulmonary neg pulmonary ROS, former smoker,    Pulmonary exam normal breath sounds clear to auscultation       Cardiovascular hypertension, Pt. on medications Normal cardiovascular exam Rhythm:Regular Rate:Normal     Neuro/Psych negative neurological ROS  negative psych ROS   GI/Hepatic negative GI ROS, Neg liver ROS,   Endo/Other  diabetes, Insulin Dependent  Renal/GU negative Renal ROS  negative genitourinary   Musculoskeletal negative musculoskeletal ROS (+)   Abdominal   Peds negative pediatric ROS (+)  Hematology negative hematology ROS (+)   Anesthesia Other Findings   Reproductive/Obstetrics negative OB ROS                             Anesthesia Physical Anesthesia Plan  ASA: III  Anesthesia Plan: General   Post-op Pain Management:    Induction: Intravenous  Airway Management Planned: LMA  Additional Equipment:   Intra-op Plan:   Post-operative Plan: Extubation in OR  Informed Consent: I have reviewed the patients History and Physical, chart, labs and discussed the procedure including the risks, benefits and alternatives for the proposed anesthesia with the patient or authorized representative who has indicated his/her understanding and acceptance.   Dental advisory given  Plan Discussed with: CRNA and Surgeon  Anesthesia Plan Comments:         Anesthesia Quick Evaluation

## 2016-03-22 NOTE — Transfer of Care (Signed)
  Last Vitals:  Vitals:   03/22/16 0714 03/22/16 0948  BP: (!) 145/70 (P) 134/68  Pulse: 64 (P) 78  Resp: 16 (P) 12  Temp: 36.9 C (P) 36.5 C    Last Pain:  Vitals:   03/22/16 4128  TempSrc: Oral      Patients Stated Pain Goal: 7 (03/22/16 0745)  Immediate Anesthesia Transfer of Care Note  Patient: Kenneth Little  Procedure(s) Performed: Procedure(s) (LRB): CYSTOSCOPY WITH BLADDER  BIOPSY WITH FULGURATION (N/A)  Patient Location: PACU  Anesthesia Type: General  Level of Consciousness: awake, alert  and oriented  Airway & Oxygen Therapy: Patient Spontanous Breathing and Patient connected to nasal cannula oxygen  Post-op Assessment: Report given to PACU RN and Post -op Vital signs reviewed and stable  Post vital signs: Reviewed and stable  Complications: No apparent anesthesia complications

## 2016-03-22 NOTE — Interval H&P Note (Signed)
History and Physical Interval Note:  I reviewed the procedure with [name] and his son.    03/22/2016 8:59 AM  Kenneth Little  has presented today for surgery, with the diagnosis of BLADDER LESION  The various methods of treatment have been discussed with the patient and family. After consideration of risks, benefits and other options for treatment, the patient has consented to  Procedure(s): CYSTOSCOPY WITH BLADDER  BIOPSY WITH FULGURATION (N/A) as a surgical intervention .  The patient's history has been reviewed, patient examined, no change in status, stable for surgery.  I have reviewed the patient's chart and labs.  Questions were answered to the patient's satisfaction.     Cayci Mcnabb J

## 2016-03-22 NOTE — Anesthesia Procedure Notes (Signed)
Procedure Name: LMA Insertion Date/Time: 03/22/2016 9:08 AM Performed by: Myrtie Soman Pre-anesthesia Checklist: Patient identified, Emergency Drugs available, Suction available and Patient being monitored Patient Re-evaluated:Patient Re-evaluated prior to inductionOxygen Delivery Method: Circle system utilized Preoxygenation: Pre-oxygenation with 100% oxygen Intubation Type: IV induction Ventilation: Mask ventilation without difficulty LMA: LMA inserted LMA Size: 4.0 Number of attempts: 1 Airway Equipment and Method: Bite block Placement Confirmation: positive ETCO2 Tube secured with: Tape Dental Injury: Teeth and Oropharynx as per pre-operative assessment

## 2016-03-23 ENCOUNTER — Encounter (HOSPITAL_BASED_OUTPATIENT_CLINIC_OR_DEPARTMENT_OTHER): Payer: Self-pay | Admitting: Urology

## 2016-05-03 ENCOUNTER — Encounter (HOSPITAL_COMMUNITY): Payer: Self-pay | Admitting: Psychiatry

## 2016-05-03 ENCOUNTER — Ambulatory Visit (INDEPENDENT_AMBULATORY_CARE_PROVIDER_SITE_OTHER): Payer: Medicare Other | Admitting: Psychiatry

## 2016-05-03 VITALS — BP 122/70 | HR 66 | Ht 67.0 in | Wt 176.0 lb

## 2016-05-03 DIAGNOSIS — F4323 Adjustment disorder with mixed anxiety and depressed mood: Secondary | ICD-10-CM | POA: Diagnosis not present

## 2016-05-03 DIAGNOSIS — Z794 Long term (current) use of insulin: Secondary | ICD-10-CM | POA: Diagnosis not present

## 2016-05-03 DIAGNOSIS — Z87891 Personal history of nicotine dependence: Secondary | ICD-10-CM

## 2016-05-03 DIAGNOSIS — Z79899 Other long term (current) drug therapy: Secondary | ICD-10-CM | POA: Diagnosis not present
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
## 2016-05-05 ENCOUNTER — Ambulatory Visit (HOSPITAL_COMMUNITY): Payer: Medicare Other | Admitting: Psychiatry

## 2016-05-06 ENCOUNTER — Ambulatory Visit (HOSPITAL_COMMUNITY): Payer: Medicare Other | Admitting: Psychiatry

## 2016-05-23 ENCOUNTER — Encounter: Payer: Self-pay | Admitting: Family Medicine

## 2016-06-13 NOTE — Addendum Note (Signed)
Addendum  created 06/13/16 1219 by Myrtie Soman, MD   Sign clinical note

## 2016-08-04 ENCOUNTER — Ambulatory Visit (INDEPENDENT_AMBULATORY_CARE_PROVIDER_SITE_OTHER): Payer: Medicare Other | Admitting: Psychiatry

## 2016-08-04 ENCOUNTER — Encounter (HOSPITAL_COMMUNITY): Payer: Self-pay | Admitting: Psychiatry

## 2016-08-04 VITALS — BP 136/70 | HR 67 | Ht 67.0 in | Wt 176.0 lb

## 2016-08-04 DIAGNOSIS — F4323 Adjustment disorder with mixed anxiety and depressed mood: Secondary | ICD-10-CM

## 2016-08-04 DIAGNOSIS — Z87891 Personal history of nicotine dependence: Secondary | ICD-10-CM | POA: Diagnosis not present

## 2016-08-04 MED ORDER — DIVALPROEX SODIUM ER 250 MG PO TB24: ORAL_TABLET | ORAL | 3 refills | Status: DC
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
## 2016-08-30 ENCOUNTER — Ambulatory Visit (INDEPENDENT_AMBULATORY_CARE_PROVIDER_SITE_OTHER): Payer: Medicare Other | Admitting: Internal Medicine

## 2016-08-30 ENCOUNTER — Encounter: Payer: Self-pay | Admitting: Internal Medicine

## 2016-08-30 VITALS — BP 142/68 | HR 68 | Temp 98.2°F | Ht 67.0 in | Wt 174.8 lb

## 2016-08-30 DIAGNOSIS — I10 Essential (primary) hypertension: Secondary | ICD-10-CM

## 2016-08-30 DIAGNOSIS — Z794 Long term (current) use of insulin: Secondary | ICD-10-CM

## 2016-08-30 DIAGNOSIS — N400 Enlarged prostate without lower urinary tract symptoms: Secondary | ICD-10-CM | POA: Diagnosis not present

## 2016-08-30 DIAGNOSIS — Z860100 Personal history of colon polyps, unspecified: Secondary | ICD-10-CM

## 2016-08-30 DIAGNOSIS — E119 Type 2 diabetes mellitus without complications: Secondary | ICD-10-CM

## 2016-08-30 DIAGNOSIS — E785 Hyperlipidemia, unspecified: Secondary | ICD-10-CM

## 2016-08-30 DIAGNOSIS — R972 Elevated prostate specific antigen [PSA]: Secondary | ICD-10-CM

## 2016-08-30 DIAGNOSIS — Z0000 Encounter for general adult medical examination without abnormal findings: Secondary | ICD-10-CM | POA: Diagnosis not present

## 2016-08-30 DIAGNOSIS — E7800 Pure hypercholesterolemia, unspecified: Secondary | ICD-10-CM

## 2016-08-30 NOTE — Progress Notes (Signed)
Subjective:    Patient ID: Kenneth Little, male    DOB: 05-30-1945, 71 y.o.   MRN: [id number]  HPI  71 year old patient who is seen today for a preventive health examination. He has type 2 diabetes and is followed quarterly by endocrinology. Since his last visit here, he has had a prostate biopsy performed by Dr. Jeffie Pollock.  He is scheduled for follow-up soon.  He is scheduled to see Dr. Claudean Kinds for his annual eye examination on September 6 His last colonoscopy was 2015 by Dr. Collene Mares In general doing well. He is followed by psychiatry/psychology ([facility]) for adjustment disorder.  He is now on Depakote.  He has been under considerable situational stress due to the health of his wife who has some cognitive impairment.  She presently is residing with a son in the [place] area and this also has been stressful  Clinically doing well.  Recent lab from endocrinology reviewed.  Hemoglobin A1c 7.0 He does monitor blood pressure readings at home frequently.  He remains quite active in the physical sense including half court basketball with much younger players  Past Medical History:  Diagnosis Date  . Diabetes mellitus without complication ([place])   . Enlarged prostate   . Hyperlipidemia   . Hypertension   . UTI (lower urinary tract infection)      Social History   Social History  . Marital status: Married    Spouse name: N/A  . Number of children: N/A  . Years of education: N/A   Occupational History  . Not on file.   Social History Main Topics  . Smoking status: Former Smoker    Types: Cigarettes    Quit date: 11/20/1988  . Smokeless tobacco: Never Used  . Alcohol use No  . Drug use: No  . Sexual activity: Yes   Other Topics Concern  . Not on file   Social History Narrative  . No narrative on file    Past Surgical History:  Procedure Laterality Date  . CHOLECYSTECTOMY N/A 12/24/2012   Procedure: LAPAROSCOPIC CHOLECYSTECTOMY WITH INTRAOPERATIVE CHOLANGIOGRAM;   Surgeon: Shann Medal, MD;  [facility];  Service: General;  Laterality: N/A;  . CYSTOSCOPY WITH BIOPSY N/A 03/22/2016   Procedure: CYSTOSCOPY WITH BLADDER  BIOPSY WITH FULGURATION;  Surgeon: Irine Seal, MD;  Location: [place] [facility];  Service: Urology;  Laterality: N/A;  . diverticulitis    . ERCP N/A 12/23/2012   Procedure: ENDOSCOPIC RETROGRADE CHOLANGIOPANCREATOGRAPHY (ERCP);  Surgeon: Jeryl Columbia, MD;  [facility];  Service: Endoscopy;  Laterality: N/A;  . HEMORRHOID SURGERY  03/20/12   internal  . HERNIA REPAIR Bilateral    childhood    Family History  Problem Relation Age of Onset  . Cancer Mother        breast  . Hypertension Father   . Diabetes Brother     No Known Allergies  Current Outpatient Prescriptions on File Prior to Visit  Medication Sig Dispense Refill  . alfuzosin (UROXATRAL) 10 MG 24 hr tablet Take 10 mg by mouth every evening.    Marland Kitchen aspirin EC 81 MG tablet Take 81 mg by mouth every morning.     Marland Kitchen atorvastatin (LIPITOR) 40 MG tablet Take 40 mg by mouth every evening.     . calcium-vitamin D (OSCAL WITH D) 500-200 MG-UNIT per tablet Take 1 tablet by mouth every morning.    . divalproex (DEPAKOTE ER) 250 MG 24 hr tablet 1  qam  For  4  days  Then  2  qam  For 4 days  Then   3  qam 90 tablet 3  . insulin glargine (LANTUS) 100 UNIT/ML injection Inject 25 Units into the skin at bedtime.     . INVOKANA 300 MG TABS Take 1 tablet by mouth every morning.     . metFORMIN (GLUCOPHAGE) 1000 MG tablet Take 1,000 mg by mouth 2 (two) times daily with a meal.    . ONETOUCH VERIO test strip     . pioglitazone (ACTOS) 45 MG tablet Take 45 mg by mouth every morning.     . sitaGLIPtin (JANUVIA) 100 MG tablet Take 100 mg by mouth every evening.     . TURMERIC PO Take by mouth.     No current facility-administered medications on file prior to visit.     BP (!) 142/68 (BP Location: Left Arm, Patient Position: Sitting, Cuff Size: Normal)   Pulse 68   Temp  98.2 F (36.8 C) (Oral)   Ht 5\' 7"  (1.702 m)   Wt 174 lb 12.8 oz (79.3 kg)   SpO2 95%   BMI 27.38 kg/m     Review of Systems  Constitutional: Negative for appetite change, chills, fatigue and fever.  HENT: Negative for congestion, dental problem, ear pain, hearing loss, sore throat, tinnitus, trouble swallowing and voice change.   Eyes: Negative for pain, discharge and visual disturbance.  Respiratory: Negative for cough, chest tightness, wheezing and stridor.   Cardiovascular: Negative for chest pain, palpitations and leg swelling.  Gastrointestinal: Negative for abdominal distention, abdominal pain, blood in stool, constipation, diarrhea, nausea and vomiting.  Genitourinary: Negative for difficulty urinating, discharge, flank pain, genital sores, hematuria and urgency.  Musculoskeletal: Positive for arthralgias. Negative for back pain, gait problem, joint swelling, myalgias and neck stiffness.       Occasional right knee pain  Skin: Negative for rash.  Neurological: Negative for dizziness, syncope, speech difficulty, weakness, numbness and headaches.  Hematological: Negative for adenopathy. Does not bruise/bleed easily.  Psychiatric/Behavioral: Positive for behavioral problems. Negative for dysphoric mood. The patient is nervous/anxious.        Objective:   Physical Exam  Constitutional: He appears well-developed and well-nourished.  HENT:  Head: Normocephalic and atraumatic.  Right Ear: External ear normal.  Left Ear: External ear normal.  Nose: Nose normal.  Mouth/Throat: Oropharynx is clear and moist.  Eyes: Pupils are equal, round, and reactive to light. Conjunctivae and EOM are normal. No scleral icterus.  Neck: Normal range of motion. Neck supple. No JVD present. No thyromegaly present.  Cardiovascular: Regular rhythm, normal heart sounds and intact distal pulses.  Exam reveals no gallop and no friction rub.   No murmur heard. Pulmonary/Chest: Effort normal and breath  sounds normal. He exhibits no tenderness.  Abdominal: Soft. Bowel sounds are normal. He exhibits no distension and no mass. There is no tenderness.  Genitourinary: Penis normal.  Musculoskeletal: Normal range of motion. He exhibits no edema or tenderness.  Lymphadenopathy:    He has no cervical adenopathy.  Neurological: He is alert. He has normal reflexes. No cranial nerve deficit. Coordination normal.  Skin: Skin is warm and dry. No rash noted.  Psychiatric: He has a normal mood and affect. His behavior is normal.          Assessment & Plan:   Preventive health examination Diabetes mellitus.  Reasonable control.  Follow-up endocrinology.  Annual eye examination as scheduled BPH.  Follow-up urology Hypertension, well-controlled.  Continue home blood  pressure monitoring Dyslipidemia.  Continue statin therapy Adjustment disorder.  Follow-up behavioral health  Return here in one year or as needed  Nyoka Cowden

## 2016-08-30 NOTE — Patient Instructions (Addendum)
Limit your sodium (Salt) intake   Please check your hemoglobin A1c every 3 months    It is important that you exercise regularly, at least 20 minutes 3 to 4 times per week.  If you develop chest pain or shortness of breath seek  medical attention.   Health Maintenance, Male A healthy lifestyle and preventive care is important for your health and wellness. Ask your health care provider about what schedule of regular examinations is right for you. What should I know about weight and diet? Eat a Healthy Diet  Eat plenty of vegetables, fruits, whole grains, low-fat dairy products, and lean protein.  Do not eat a lot of foods high in solid fats, added sugars, or salt.  Maintain a Healthy Weight Regular exercise can help you achieve or maintain a healthy weight. You should:  Do at least 150 minutes of exercise each week. The exercise should increase your heart rate and make you sweat (moderate-intensity exercise).  Do strength-training exercises at least twice a week.  Watch Your Levels of Cholesterol and Blood Lipids  Have your blood tested for lipids and cholesterol every 5 years starting at 69 years of age. If you are at high risk for heart disease, you should start having your blood tested when you are 69 years old. You may need to have your cholesterol levels checked more often if: ? Your lipid or cholesterol levels are high. ? You are older than 68 years of age. ? You are at high risk for heart disease.  What should I know about cancer screening? Many types of cancers can be detected early and may often be prevented. Lung Cancer  You should be screened every year for lung cancer if: ? You are a current smoker who has smoked for at least 30 years. ? You are a former smoker who has quit within the past 15 years.  Talk to your health care provider about your screening options, when you should start screening, and how often you should be screened.  Colorectal Cancer  Routine  colorectal cancer screening usually begins at 69 years of age and should be repeated every 5-10 years until you are 69 years old. You may need to be screened more often if early forms of precancerous polyps or small growths are found. Your health care provider may recommend screening at an earlier age if you have risk factors for colon cancer.  Your health care provider may recommend using home test kits to check for hidden blood in the stool.  A small camera at the end of a tube can be used to examine your colon (sigmoidoscopy or colonoscopy). This checks for the earliest forms of colorectal cancer.  Prostate and Testicular Cancer  Depending on your age and overall health, your health care provider may do certain tests to screen for prostate and testicular cancer.  Talk to your health care provider about any symptoms or concerns you have about testicular or prostate cancer.  Skin Cancer  Check your skin from head to toe regularly.  Tell your health care provider about any new moles or changes in moles, especially if: ? There is a change in a mole's size, shape, or color. ? You have a mole that is larger than a pencil eraser.  Always use sunscreen. Apply sunscreen liberally and repeat throughout the day.  Protect yourself by wearing long sleeves, pants, a wide-brimmed hat, and sunglasses when outside.  What should I know about heart disease, diabetes, and high blood pressure?  If you are 67-41 years of age, have your blood pressure checked every 3-5 years. If you are 50 years of age or older, have your blood pressure checked every year. You should have your blood pressure measured twice-once when you are at a hospital or clinic, and once when you are not at a hospital or clinic. Record the average of the two measurements. To check your blood pressure when you are not at a hospital or clinic, you can use: ? An automated blood pressure machine at a pharmacy. ? A home blood pressure  monitor.  Talk to your health care provider about your target blood pressure.  If you are between 30-3 years old, ask your health care provider if you should take aspirin to prevent heart disease.  Have regular diabetes screenings by checking your fasting blood sugar level. ? If you are at a normal weight and have a low risk for diabetes, have this test once every three years after the age of 59. ? If you are overweight and have a high risk for diabetes, consider being tested at a younger age or more often.  A one-time screening for abdominal aortic aneurysm (AAA) by ultrasound is recommended for men aged 2-75 years who are current or former smokers. What should I know about preventing infection? Hepatitis B If you have a higher risk for hepatitis B, you should be screened for this virus. Talk with your health care provider to find out if you are at risk for hepatitis B infection. Hepatitis C Blood testing is recommended for:  Everyone born from [birth year] through 1963.  Anyone with known risk factors for hepatitis C.  Sexually Transmitted Diseases (STDs)  You should be screened each year for STDs including gonorrhea and chlamydia if: ? You are sexually active and are younger than 69 years of age. ? You are older than 69 years of age and your health care provider tells you that you are at risk for this type of infection. ? Your sexual activity has changed since you were last screened and you are at an increased risk for chlamydia or gonorrhea. Ask your health care provider if you are at risk.  Talk with your health care provider about whether you are at high risk of being infected with HIV. Your health care provider may recommend a prescription medicine to help prevent HIV infection.  What else can I do?  Schedule regular health, dental, and eye exams.  Stay current with your vaccines (immunizations).  Do not use any tobacco products, such as cigarettes, chewing tobacco, and  e-cigarettes. If you need help quitting, ask your health care provider.  Limit alcohol intake to no more than 2 drinks per day. One drink equals 12 ounces of beer, 5 ounces of wine, or 1 ounces of hard liquor.  Do not use street drugs.  Do not share needles.  Ask your health care provider for help if you need support or information about quitting drugs.  Tell your health care provider if you often feel depressed.  Tell your health care provider if you have ever been abused or do not feel safe at home. This information is not intended to replace advice given to you by your health care provider. Make sure you discuss any questions you have with your health care provider. Document Released: 06/25/2007 Document Revised: 08/26/2015 Document Reviewed: 09/30/2014 Elsevier Interactive Patient Education  2018 [employer]. result

## 2016-09-13 LAB — HM DIABETES EYE EXAM

## 2016-09-29 ENCOUNTER — Encounter: Payer: Self-pay | Admitting: Internal Medicine

## 2016-09-30 ENCOUNTER — Ambulatory Visit (HOSPITAL_COMMUNITY): Payer: Self-pay | Admitting: Psychiatry

## 2016-10-20 ENCOUNTER — Ambulatory Visit (INDEPENDENT_AMBULATORY_CARE_PROVIDER_SITE_OTHER): Payer: Medicare Other | Admitting: Psychiatry

## 2016-10-20 ENCOUNTER — Encounter (HOSPITAL_COMMUNITY): Payer: Self-pay | Admitting: Psychiatry

## 2016-10-20 ENCOUNTER — Ambulatory Visit: Payer: Self-pay

## 2016-10-20 VITALS — BP 138/74 | HR 71 | Ht 66.5 in | Wt 176.0 lb

## 2016-10-20 DIAGNOSIS — Z87891 Personal history of nicotine dependence: Secondary | ICD-10-CM | POA: Diagnosis not present

## 2016-10-20 DIAGNOSIS — F3162 Bipolar disorder, current episode mixed, moderate: Secondary | ICD-10-CM

## 2016-10-20 DIAGNOSIS — F4323 Adjustment disorder with mixed anxiety and depressed mood: Secondary | ICD-10-CM

## 2016-10-20 MED ORDER — DIVALPROEX SODIUM ER 500 MG PO TB24: ORAL_TABLET | ORAL | 3 refills | Status: DC

## 2016-10-20 NOTE — Progress Notes (Signed)
Psychiatric Initial Adult Assessment   Patient Identification: Kenneth Little MRN:  [id number] Date of Evaluation:  10/20/2016 Referral Source: Self referral Chief Complaint:   Chief Complaint    Follow-up     Visit Diagnosis:    ICD-10-CM   1. Bipolar 1 disorder, mixed, moderate (HCC) F31.62 Valproic acid level   Today the patient is seen with his son. His son says that overall he is a bit better. He is more calmer. He still on a great difficulty dealing with his family in getting back in relationship with him. He did meet with the therapist and he did have a family session where they made a plan for them to go out to eat weekly but the patient had difficulty controlling his boundaries. Continue to call the family specifically his daughter and son. The patient feels a great affinity both to his wife and to his grandchildren. Since I've seen and there was a brief moment where he said he wanted to die. The son called the police who evaluated. Patient is very frustrated. He is living alone. He says he takes a decade just as prescribed. He has a return appointment with his therapist tomorrow. The significant ongoing dynamic stressor and this is the fact that his wife is slowly deteriorating state of dementia. He just simply does not have the tolerability to deal with her. I don't think he can understand that this is not her fault that she's not doing any lifting she does purposely. She is declining and the son is taking care of her who is here with [place] today says she is very difficult to take care of. She is doing some of her basic ADLs but she really is someone he needs a lot of care. Son is aware that she just can continue to decline and I don't think this patient has the capacity to deal with his dementing   wife.(Hypo) Manic Symptoms:   Anxiety Symptoms:   Psychotic Symptoms:   PTSD Symptoms:   Past Psychiatric History:   Previous Psychotropic Medications: No   Substance Abuse History in the  last 12 months:  No.  Consequences of Substance Abuse:   Past Medical History:  Past Medical History:  Diagnosis Date  . Diabetes mellitus without complication ([place])   . Enlarged prostate   . Hyperlipidemia   . Hypertension   . UTI (lower urinary tract infection)     Past Surgical History:  Procedure Laterality Date  . CHOLECYSTECTOMY N/A 12/24/2012   Procedure: LAPAROSCOPIC CHOLECYSTECTOMY WITH INTRAOPERATIVE CHOLANGIOGRAM;  Surgeon: Shann Medal, MD;  Location: [facility];  Service: General;  Laterality: N/A;  . CYSTOSCOPY WITH BIOPSY N/A 03/22/2016   Procedure: CYSTOSCOPY WITH BLADDER  BIOPSY WITH FULGURATION;  Surgeon: Irine Seal, MD;  Location: [facility];  Service: Urology;  Laterality: N/A;  . diverticulitis    . ERCP N/A 12/23/2012   Procedure: ENDOSCOPIC RETROGRADE CHOLANGIOPANCREATOGRAPHY (ERCP);  Surgeon: Jeryl Columbia, MD;  Location: [facility];  Service: Endoscopy;  Laterality: N/A;  . HEMORRHOID SURGERY  03/20/12   internal  . HERNIA REPAIR Bilateral    childhood    Family Psychiatric History:   Family History:  Family History  Problem Relation Age of Onset  . Cancer Mother        breast  . Hypertension Father   . Diabetes Brother     Social History:   Social History   Social History  . Marital status: Married    Spouse name:  N/A  . Number of children: N/A  . Years of education: N/A   Social History Main Topics  . Smoking status: Former Smoker    Types: Cigarettes    Quit date: 11/20/1988  . Smokeless tobacco: Never Used  . Alcohol use No  . Drug use: No  . Sexual activity: Yes    Partners: Female   Other Topics Concern  . None   Social History Narrative  . None    Additional Social History:   Allergies:  No Known Allergies  Metabolic Disorder Labs: Lab Results  Component Value Date   HGBA1C 7.0 (H) 08/21/2015   No results found for: PROLACTIN Lab Results  Component Value Date   CHOL 94 08/21/2015   TRIG 70.0  08/21/2015   HDL 42.60 08/21/2015   CHOLHDL 2 08/21/2015   VLDL 14.0 08/21/2015   LDLCALC 38 08/21/2015   LDLCALC 37 06/20/2014     Current Medications: Current Outpatient Prescriptions  Medication Sig Dispense Refill  . alfuzosin (UROXATRAL) 10 MG 24 hr tablet Take 10 mg by mouth every evening.    Marland Kitchen aspirin EC 81 MG tablet Take 81 mg by mouth every morning.     Marland Kitchen atorvastatin (LIPITOR) 40 MG tablet Take 40 mg by mouth every evening.     . calcium-vitamin D (OSCAL WITH D) 500-200 MG-UNIT per tablet Take 1 tablet by mouth every morning.    . divalproex (DEPAKOTE ER) 500 MG 24 hr tablet 2 qam 60 tablet 3  . insulin glargine (LANTUS) 100 UNIT/ML injection Inject 25 Units into the skin at bedtime.     . INVOKANA 300 MG TABS Take 1 tablet by mouth every morning.     . metFORMIN (GLUCOPHAGE) 1000 MG tablet Take 1,000 mg by mouth 2 (two) times daily with a meal.    . olmesartan (BENICAR) 40 MG tablet Take 40 mg by mouth daily.    Glory Rosebush VERIO test strip     . pioglitazone (ACTOS) 45 MG tablet Take 45 mg by mouth every morning.     . sitaGLIPtin (JANUVIA) 100 MG tablet Take 100 mg by mouth every evening.     . TURMERIC PO Take by mouth.     No current facility-administered medications for this visit.     Neurologic: Headache: No Seizure: No Paresthesias:No  Musculoskeletal: Strength & Muscle Tone: within normal limits Gait & Station: normal Patient leans: N/A  Psychiatric Specialty Exam: ROS  Blood pressure 138/74, pulse 71, height 5' 6.5" (1.689 m), weight 176 lb (79.8 kg).Body mass index is 27.98 kg/m.  General Appearance: Casual  Eye Contact:  Good  Speech:  Clear and Coherent  Volume:  Normal  Mood:  Euthymic  Affect:  Appropriate  Thought Process:  Coherent  Orientation:  NA  Thought Content:  Logical  Suicidal Thoughts:  No  Homicidal Thoughts:  No  Memory:  NA  Judgement:  Good  Insight:  Fair  Psychomotor Activity:  Normal  Concentration:    Recall:   Good  Fund of Knowledge:Fair  Language: Good  Akathisia:  No  Handed:  Right  AIMS (if indicated):    Assets:  Communication Skills  ADL's:  Intact  Cognition: WNL  Sleep:      Treatment Plan Summary: 10/11/20183:38 PM  There is some sense that he is a little bit better on the Depakote. Today we'll go ahead and increase it from 750 mg to dose of 500 mg ER taking 2 in the morning for  a total of 1000 mg. Await 2 weeks and then will get a Depakote blood level. Will try to confirm that at least the Depakote is therapeutic terms of the blood level. He'll return to see me in about 2 months and hopefully with a good report from his family less impulsive less irritable. He really is a complex situation. Is related to social Holter issues, taking care of the wife is demented and conflict with his son-in-law. At this time the patient is not suicidal nor is he homicidal. There return in about 2 months.

## 2016-10-27 ENCOUNTER — Ambulatory Visit (INDEPENDENT_AMBULATORY_CARE_PROVIDER_SITE_OTHER): Payer: Medicare Other

## 2016-10-27 DIAGNOSIS — Z0000 Encounter for general adult medical examination without abnormal findings: Secondary | ICD-10-CM | POA: Diagnosis not present

## 2016-10-27 DIAGNOSIS — Z23 Encounter for immunization: Secondary | ICD-10-CM | POA: Diagnosis not present

## 2016-10-27 NOTE — Progress Notes (Addendum)
Subjective:   Kenneth Little is a 71 y.o. male who presents for Medicare Annual/Subsequent preventive examination.  The Patient was informed that the wellness visit is to identify future health risk and educate and initiate measures that can reduce risk for increased disease through the lifespan.    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 Claudean Kinds Diabetic eye exam - no DM retinopathy 2017  Call to Dr. Aletha Halim office to fax eye report today  Eye for a diabetic is healthy Has cataract but may wait until next year to have these removed     Wife has early memory loss She is 15;  States this is stressful Given resources; she is in the early stage.  Acuses family of taking things etc ST memory bad but is ok by herself   Dental - that is fine   Stressors: caregiver  Sleep patterns: ok   Pain 1-10  Pain in knee; 3     Cardiac Risk Factors Addressed Hyperlipidemia - chol/ hdl 2; chol 94; hdl 42; ldl 38 and trig 70 Diabetes A1c 7.0;    Advanced Directives- completed  Patient Care Team: Marletta Lor, MD as PCP - General (Internal Medicine) Juanita Craver, MD as Consulting Physician (Gastroenterology) Clarene Essex, MD as Consulting Physician (Gastroenterology) Juanito Doom, MD as Consulting Physician (Pulmonary Disease)  Dr. Elyse Hsu, [facility] in endo   Dr. Jeffie Pollock in UR           Objective:    Vitals: BP (!) 120/56   Pulse 79   Ht 5\' 7"  (1.702 m)   Wt 174 lb 8 oz (79.2 kg)   SpO2 97%   BMI 27.33 kg/m   Body mass index is 27.33 kg/m.  Tobacco History  Smoking Status  . Former Smoker  . Types: Cigarettes  . Quit date: 11/20/1988  Smokeless Tobacco  . Never Used     Counseling given: Yes   Past Medical History:  Diagnosis Date  . Diabetes mellitus without complication ([place])   . Enlarged prostate   . Hyperlipidemia   . Hypertension   . UTI (lower urinary tract infection)    Past Surgical History:  Procedure Laterality Date  . CHOLECYSTECTOMY N/A 12/24/2012   Procedure: LAPAROSCOPIC CHOLECYSTECTOMY WITH INTRAOPERATIVE CHOLANGIOGRAM;  Surgeon: Shann Medal, MD;  [facility]: [facility];  Service: General;  Laterality: N/A;  . CYSTOSCOPY WITH BIOPSY N/A 03/22/2016   Procedure: CYSTOSCOPY WITH BLADDER  BIOPSY WITH FULGURATION;  Surgeon: Irine Seal, MD;  Location: [facility];  Service: Urology;  Laterality: N/A;  . diverticulitis    . ERCP N/A 12/23/2012   Procedure: ENDOSCOPIC RETROGRADE CHOLANGIOPANCREATOGRAPHY (ERCP);  Surgeon: Jeryl Columbia, MD;  [facility];  Service: Endoscopy;  Laterality: N/A;  . HEMORRHOID SURGERY  03/20/12   internal  . HERNIA REPAIR Bilateral  childhood   Family History  Problem Relation Age of Onset  . Cancer Mother        breast  . Hypertension Father   . Diabetes Brother    History  Sexual Activity  . Sexual activity: Yes  . Partners: Female    Outpatient Encounter Prescriptions as of 10/27/2016  Medication Sig  . alfuzosin (UROXATRAL) 10 MG 24 hr tablet Take 10 mg by mouth every evening.  Marland Kitchen aspirin EC 81 MG tablet Take 81 mg by mouth every morning.   Marland Kitchen atorvastatin (LIPITOR) 40 MG tablet Take 40 mg by mouth every evening.   . calcium-vitamin D (OSCAL WITH D) 500-200 MG-UNIT per tablet Take 1 tablet by mouth every morning.  . divalproex (DEPAKOTE ER) 500 MG 24 hr tablet 2 qam  . insulin  glargine (LANTUS) 100 UNIT/ML injection Inject 25 Units into the skin at bedtime.   . INVOKANA 300 MG TABS Take 1 tablet by mouth every morning.   . metFORMIN (GLUCOPHAGE) 1000 MG tablet Take 1,000 mg by mouth 2 (two) times daily with a meal.  . olmesartan (BENICAR) 40 MG tablet Take 40 mg by mouth daily.  Glory Rosebush VERIO test strip   . pioglitazone (ACTOS) 45 MG tablet Take 45 mg by mouth every morning.   . sitaGLIPtin (JANUVIA) 100 MG tablet Take 100 mg by mouth every evening.   . TURMERIC PO Take by mouth.  . Vitamin D, Ergocalciferol, (DRISDOL) 50000 units CAPS capsule Take 50,000 Units by mouth every 7 (seven) days.   No facility-administered encounter medications on file as of 10/27/2016.     Activities of Daily Living In your present state of health, do you have any difficulty performing the following activities: 10/27/2016 03/22/2016  Hearing? N N  Vision? N N  Difficulty concentrating or making decisions? N N  Walking or climbing stairs? N N  Dressing or bathing? N N  Doing errands, shopping? N -  Preparing Food and eating ? N -  Using the Toilet? N -  In the past six months, have you accidently leaked urine? N -  Do you have problems with loss of bowel control? N -  Managing your Medications? N -  Managing your Finances? N -  Housekeeping or managing your Housekeeping? N -  Some recent data might be hidden    Patient Care Team: Marletta Lor, MD as PCP - General (Internal Medicine) Juanita Craver, MD as Consulting Physician (Gastroenterology) Clarene Essex, MD as Consulting Physician (Gastroenterology) Juanito Doom, MD as Consulting Physician (Pulmonary Disease)   Assessment:    Exercise Activities and Dietary recommendations Current Exercise Habits: Home exercise routine;Structured exercise class, Type of exercise: strength training/weights;Other - see comments, Time (Minutes): > 60, Frequency (Times/Week): 2 (plays basketball 8:30 to 10pm), Weekly Exercise  (Minutes/Week): 0, Intensity: Moderate  Goals    . patient          Just maintain your health Continue to take care of yourself       Fall Risk Fall Risk  10/27/2016 08/28/2015 06/27/2014 06/19/2013  Falls in the past year? No No No No   Depression Screen PHQ 2/9 Scores 10/27/2016 08/28/2015 06/27/2014 06/19/2013  PHQ - 2 Score 0 0 0 0    Cognitive Function MMSE - Mini Mental State Exam 10/27/2016  Not completed: (No Data)     Ad8 score reviewed for issues:  Issues making decisions:  Less interest in hobbies / activities:  Repeats questions, stories (family complaining):  Trouble using ordinary gadgets (microwave, computer, phone):  Forgets the month or year:   Mismanaging finances:   Remembering appts:  Daily problems with thinking and/or memory: Ad8 score is=0      Immunization History  Administered Date(s) Administered  . Influenza-Unspecified 11/20/2014  . Pneumococcal Conjugate-13 12/26/2013  . Pneumococcal Polysaccharide-23 12/23/2012  . Tetanus 06/05/2013   Screening Tests Health Maintenance  Topic Date Due  . HEMOGLOBIN A1C  02/21/2016  . INFLUENZA VACCINE  08/10/2016  . OPHTHALMOLOGY EXAM  09/09/2016  . FOOT EXAM  08/30/2017  . COLONOSCOPY  04/04/2023  . TETANUS/TDAP  06/06/2023  . Hepatitis C Screening  Completed  . PNA vac Low Risk Adult  Completed      Plan:     PCP Notes  Health Maintenance  Took [place] does flu vaccine today  Educated on shingrix but does not remember if he had the chickenpox. Will ask Dr. Raliegh Ip when in for fup   Dr. Aletha Halim office to fax eye exam; was completed earlier this year    Abnormal Screens  None   Referrals  Given info on the Alzheimer's asso and SW 24/7  Also educated on [email or web address] for more detailed information on the stages of Alzheimer's and how to best manage issues that arise   Patient concerns; Educated on the use of lantus pen Problem solved electronic BP cuff  Stated he [facility]  basketball [facility]; States his right knee started hurting x 4 weeks ago. Very painful and could barely walk. Has not play basketball since. No visible damage but describes residual pain in the back of the knee and around the outer aspect; no falls or trauma. Will make a fup apt with Dr. Raliegh Ip to examine for further assessment or treatment. States he jumps a lot and is afraid this will happen again.   Nurse Concerns; As noted   Next PCP apt / just seen in August Will make apt for fup to evaluate right knee pain    I have personally reviewed and noted the following in the patient's chart:   . Medical and social history . Use of alcohol, tobacco or illicit drugs  . Current medications and supplements . Functional ability and status . Nutritional status . Physical activity . Advanced directives . List of other physicians . Hospitalizations, surgeries, and ER visits in previous 12 months . Vitals . Screenings to include cognitive, depression, and falls . Referrals and appointments  In addition, I have reviewed and discussed with patient certain preventive protocols, quality metrics, and best practice recommendations. A written personalized care plan for preventive services as well as general preventive health recommendations were provided to patient.     YTKPT,WSFKC, RN  10/27/2016  Results of this patient's annual Medicare wellness visit, reviewed and agree with clinical findings.  Follow-up endocrinology.  Nyoka Cowden

## 2016-10-27 NOTE — Patient Instructions (Addendum)
Mr. Kenneth Little , Thank you for taking time to come for your Medicare Wellness Visit. I appreciate your ongoing commitment to your health goals. Please review the following plan we discussed and let me know if I can assist you in the future.   Will schedule with Dr. Raliegh Ip to fup on knee   Shingrix is a vaccine for the prevention of Shingles in Adults 50 and older.  If you are on Medicare, you can request a prescription from your doctor to be filled at a pharmacy.  Please check with your benefits regarding applicable copays or out of pocket expenses.  The Shingrix is given in 2 vaccines approx 8 weeks apart. You must receive the 2nd dose prior to 6 months from receipt of the first.    These are the goals we discussed: Goals    . patient          Just maintain your health Continue to take care of yourself        This is a list of the screening recommended for you and due dates:  Health Maintenance  Topic Date Due  . Flu Shot  08/10/2016  . Eye exam for diabetics  09/09/2016  . Hemoglobin A1C  12/09/2016*  . Complete foot exam   08/30/2017  . Colon Cancer Screening  04/04/2023  . Tetanus Vaccine  06/06/2023  .  Hepatitis C: One time screening is recommended by Center for Disease Control  (CDC) for  adults born from [birth year] through 1965.   Completed  . Pneumonia vaccines  Completed  *Topic was postponed. The date shown is not the original due date.     Fall Prevention in the Home Falls can cause injuries. They can happen to people of all ages. There are many things you can do to make your home safe and to help prevent falls. What can I do on the outside of my home?  Regularly fix the edges of walkways and driveways and fix any cracks.  Remove anything that might make you trip as you walk through a door, such as a raised step or threshold.  Trim any bushes or trees on the path to your home.  Use bright outdoor lighting.  Clear any walking paths of anything that might make someone trip,  such as rocks or tools.  Regularly check to see if handrails are loose or broken. Make sure that both sides of any steps have handrails.  Any raised decks and porches should have guardrails on the edges.  Have any leaves, snow, or ice cleared regularly.  Use sand or salt on walking paths during winter.  Clean up any spills in your garage right away. This includes oil or grease spills. What can I do in the bathroom?  Use night lights.  Install grab bars by the toilet and in the tub and shower. Do not use towel bars as grab bars.  Use non-skid mats or decals in the tub or shower.  If you need to sit down in the shower, use a plastic, non-slip stool.  Keep the floor dry. Clean up any water that spills on the floor as soon as it happens.  Remove soap buildup in the tub or shower regularly.  Attach bath mats securely with double-sided non-slip rug tape.  Do not have throw rugs and other things on the floor that can make you trip. What can I do in the bedroom?  Use night lights.  Make sure that you have a light by your  bed that is easy to reach.  Do not use any sheets or blankets that are too big for your bed. They should not hang down onto the floor.  Have a firm chair that has side arms. You can use this for support while you get dressed.  Do not have throw rugs and other things on the floor that can make you trip. What can I do in the kitchen?  Clean up any spills right away.  Avoid walking on wet floors.  Keep items that you use a lot in easy-to-reach places.  If you need to reach something above you, use a strong step stool that has a grab bar.  Keep electrical cords out of the way.  Do not use floor polish or wax that makes floors slippery. If you must use wax, use non-skid floor wax.  Do not have throw rugs and other things on the floor that can make you trip. What can I do with my stairs?  Do not leave any items on the stairs.  Make sure that there are  handrails on both sides of the stairs and use them. Fix handrails that are broken or loose. Make sure that handrails are as long as the stairways.  Check any carpeting to make sure that it is firmly attached to the stairs. Fix any carpet that is loose or worn.  Avoid having throw rugs at the top or bottom of the stairs. If you do have throw rugs, attach them to the floor with carpet tape.  Make sure that you have a light switch at the top of the stairs and the bottom of the stairs. If you do not have them, ask someone to add them for you. What else can I do to help prevent falls?  Wear shoes that: ? Do not have high heels. ? Have rubber bottoms. ? Are comfortable and fit you well. ? Are closed at the toe. Do not wear sandals.  If you use a stepladder: ? Make sure that it is fully opened. Do not climb a closed stepladder. ? Make sure that both sides of the stepladder are locked into place. ? Ask someone to hold it for you, if possible.  Clearly mark and make sure that you can see: ? Any grab bars or handrails. ? First and last steps. ? Where the edge of each step is.  Use tools that help you move around (mobility aids) if they are needed. These include: ? Canes. ? Walkers. ? Scooters. ? Crutches.  Turn on the lights when you go into a dark area. Replace any light bulbs as soon as they burn out.  Set up your furniture so you have a clear path. Avoid moving your furniture around.  If any of your floors are uneven, fix them.  If there are any pets around you, be aware of where they are.  Review your medicines with your doctor. Some medicines can make you feel dizzy. This can increase your chance of falling. Ask your doctor what other things that you can do to help prevent falls. This information is not intended to replace advice given to you by your health care provider. Make sure you discuss any questions you have with your health care provider. Document Released: 10/23/2008  Document Revised: 06/04/2015 Document Reviewed: 01/31/2014 Elsevier Interactive Patient Education  2018 [place] Maintenance, Male A healthy lifestyle and preventive care is important for your health and wellness. Ask your health care provider about what  schedule of regular examinations is right for you. What should I know about weight and diet? Eat a Healthy Diet  Eat plenty of vegetables, fruits, whole grains, low-fat dairy products, and lean protein.  Do not eat a lot of foods high in solid fats, added sugars, or salt.  Maintain a Healthy Weight Regular exercise can help you achieve or maintain a healthy weight. You should:  Do at least 150 minutes of exercise each week. The exercise should increase your heart rate and make you sweat (moderate-intensity exercise).  Do strength-training exercises at least twice a week.  Watch Your Levels of Cholesterol and Blood Lipids  Have your blood tested for lipids and cholesterol every 5 years starting at 69 years of age. If you are at high risk for heart disease, you should start having your blood tested when you are 69 years old. You may need to have your cholesterol levels checked more often if: ? Your lipid or cholesterol levels are high. ? You are older than 69 years of age. ? You are at high risk for heart disease.  What should I know about cancer screening? Many types of cancers can be detected early and may often be prevented. Lung Cancer  You should be screened every year for lung cancer if: ? You are a current smoker who has smoked for at least 30 years. ? You are a former smoker who has quit within the past 15 years.  Talk to your health care provider about your screening options, when you should start screening, and how often you should be screened.  Colorectal Cancer  Routine colorectal cancer screening usually begins at 69 years of age and should be repeated every 5-10 years until you are 69 years old. You  may need to be screened more often if early forms of precancerous polyps or small growths are found. Your health care provider may recommend screening at an earlier age if you have risk factors for colon cancer.  Your health care provider may recommend using home test kits to check for hidden blood in the stool.  A small camera at the end of a tube can be used to examine your colon (sigmoidoscopy or colonoscopy). This checks for the earliest forms of colorectal cancer.  Prostate and Testicular Cancer  Depending on your age and overall health, your health care provider may do certain tests to screen for prostate and testicular cancer.  Talk to your health care provider about any symptoms or concerns you have about testicular or prostate cancer.  Skin Cancer  Check your skin from head to toe regularly.  Tell your health care provider about any new moles or changes in moles, especially if: ? There is a change in a mole's size, shape, or color. ? You have a mole that is larger than a pencil eraser.  Always use sunscreen. Apply sunscreen liberally and repeat throughout the day.  Protect yourself by wearing long sleeves, pants, a wide-brimmed hat, and sunglasses when outside.  What should I know about heart disease, diabetes, and high blood pressure?  If you are 56-26 years of age, have your blood pressure checked every 3-5 years. If you are 44 years of age or older, have your blood pressure checked every year. You should have your blood pressure measured twice-once when you are at a hospital or clinic, and once when you are not at a hospital or clinic. Record the average of the two measurements. To check your blood pressure when you are  not at a hospital or clinic, you can use: ? An automated blood pressure machine at a pharmacy. ? A home blood pressure monitor.  Talk to your health care provider about your target blood pressure.  If you are between 6-63 years old, ask your health care  provider if you should take aspirin to prevent heart disease.  Have regular diabetes screenings by checking your fasting blood sugar level. ? If you are at a normal weight and have a low risk for diabetes, have this test once every three years after the age of 45. ? If you are overweight and have a high risk for diabetes, consider being tested at a younger age or more often.  A one-time screening for abdominal aortic aneurysm (AAA) by ultrasound is recommended for men aged 15-75 years who are current or former smokers. What should I know about preventing infection? Hepatitis B If you have a higher risk for hepatitis B, you should be screened for this virus. Talk with your health care provider to find out if you are at risk for hepatitis B infection. Hepatitis C Blood testing is recommended for:  Everyone born from [birth year] through 1963.  Anyone with known risk factors for hepatitis C.  Sexually Transmitted Diseases (STDs)  You should be screened each year for STDs including gonorrhea and chlamydia if: ? You are sexually active and are younger than 69 years of age. ? You are older than 69 years of age and your health care provider tells you that you are at risk for this type of infection. ? Your sexual activity has changed since you were last screened and you are at an increased risk for chlamydia or gonorrhea. Ask your health care provider if you are at risk.  Talk with your health care provider about whether you are at high risk of being infected with HIV. Your health care provider may recommend a prescription medicine to help prevent HIV infection.  What else can I do?  Schedule regular health, dental, and eye exams.  Stay current with your vaccines (immunizations).  Do not use any tobacco products, such as cigarettes, chewing tobacco, and e-cigarettes. If you need help quitting, ask your health care provider.  Limit alcohol intake to no more than 2 drinks per day. One drink equals 12  ounces of beer, 5 ounces of wine, or 1 ounces of hard liquor.  Do not use street drugs.  Do not share needles.  Ask your health care provider for help if you need support or information about quitting drugs.  Tell your health care provider if you often feel depressed.  Tell your health care provider if you have ever been abused or do not feel safe at home. This information is not intended to replace advice given to you by your health care provider. Make sure you discuss any questions you have with your health care provider. Document Released: 06/25/2007 Document Revised: 08/26/2015 Document Reviewed: 09/30/2014 Elsevier Interactive Patient Education  [employer].

## 2016-10-31 ENCOUNTER — Encounter: Payer: Self-pay | Admitting: Internal Medicine

## 2016-10-31 ENCOUNTER — Ambulatory Visit (INDEPENDENT_AMBULATORY_CARE_PROVIDER_SITE_OTHER): Payer: Medicare Other | Admitting: Internal Medicine

## 2016-10-31 VITALS — BP 138/62 | HR 72 | Temp 97.7°F | Ht 67.0 in | Wt 179.2 lb

## 2016-10-31 DIAGNOSIS — M25561 Pain in right knee: Secondary | ICD-10-CM

## 2016-10-31 NOTE — Progress Notes (Signed)
Subjective:    Patient ID: DELOYD Little, male    DOB: Oct 12, 1945, 71 y.o.   MRN: [id number]  HPI  71 year old patient who presents with a several week history of right knee pain.  This started the day following a day of planned basketball.  He initially felt the knee was slightly tight and tense but he  Denies denies any instability, popping or catching.  Presently, he has very little pain with usual activities, but has not returned to basketball.  He has been able to purchase plating in his usual daily activities without discomfort.  He feels the knee is slowly improving  Past Medical History:  Diagnosis Date  . Diabetes mellitus without complication ([place])   . Enlarged prostate   . Hyperlipidemia   . Hypertension   . UTI (lower urinary tract infection)      Social History   Social History  . Marital status: Married    Spouse name: N/A  . Number of children: N/A  . Years of education: N/A   Occupational History  . Not on file.   Social History Main Topics  . Smoking status: Former Smoker    Types: Cigarettes    Quit date: 11/20/1988  . Smokeless tobacco: Never Used  . Alcohol use No  . Drug use: No  . Sexual activity: Yes    Partners: Female   Other Topics Concern  . Not on file   Social History Narrative  . No narrative on file    Past Surgical History:  Procedure Laterality Date  . CHOLECYSTECTOMY N/A 12/24/2012   Procedure: LAPAROSCOPIC CHOLECYSTECTOMY WITH INTRAOPERATIVE CHOLANGIOGRAM;  Surgeon: Shann Medal, MD;  Location: [facility];  Service: General;  Laterality: N/A;  . CYSTOSCOPY WITH BIOPSY N/A 03/22/2016   Procedure: CYSTOSCOPY WITH BLADDER  BIOPSY WITH FULGURATION;  Surgeon: Irine Seal, MD;  Location: [facility];  Service: Urology;  Laterality: N/A;  . diverticulitis    . ERCP N/A 12/23/2012   Procedure: ENDOSCOPIC RETROGRADE CHOLANGIOPANCREATOGRAPHY (ERCP);  Surgeon: Jeryl Columbia, MD;  Location: [facility];  Service: Endoscopy;  Laterality:  N/A;  . HEMORRHOID SURGERY  03/20/12   internal  . HERNIA REPAIR Bilateral    childhood    Family History  Problem Relation Age of Onset  . Cancer Mother        breast  . Hypertension Father   . Diabetes Brother     No Known Allergies  Current Outpatient Prescriptions on File Prior to Visit  Medication Sig Dispense Refill  . alfuzosin (UROXATRAL) 10 MG 24 hr tablet Take 10 mg by mouth every evening.    Marland Kitchen aspirin EC 81 MG tablet Take 81 mg by mouth every morning.     Marland Kitchen atorvastatin (LIPITOR) 40 MG tablet Take 40 mg by mouth every evening.     . calcium-vitamin D (OSCAL WITH D) 500-200 MG-UNIT per tablet Take 1 tablet by mouth every morning.    . divalproex (DEPAKOTE ER) 500 MG 24 hr tablet 2 qam 60 tablet 3  . insulin glargine (LANTUS) 100 UNIT/ML injection Inject 25 Units into the skin at bedtime.     . INVOKANA 300 MG TABS Take 1 tablet by mouth every morning.     . metFORMIN (GLUCOPHAGE) 1000 MG tablet Take 1,000 mg by mouth 2 (two) times daily with a meal.    . olmesartan (BENICAR) 40 MG tablet Take 40 mg by mouth daily.    Glory Rosebush VERIO test strip     .  pioglitazone (ACTOS) 45 MG tablet Take 45 mg by mouth every morning.     . sitaGLIPtin (JANUVIA) 100 MG tablet Take 100 mg by mouth every evening.     . TURMERIC PO Take by mouth.    . Vitamin D, Ergocalciferol, (DRISDOL) 50000 units CAPS capsule Take 50,000 Units by mouth every 7 (seven) days.     No current facility-administered medications on file prior to visit.     BP 138/62 (BP Location: Left Arm, Patient Position: Sitting, Cuff Size: Normal)   Pulse 72   Temp 97.7 F (36.5 C) (Oral)   Ht 5\' 7"  (1.702 m)   Wt 179 lb 3.2 oz (81.3 kg)   BMI 28.07 kg/m     Review of Systems  Constitutional: Negative for appetite change, chills, fatigue and fever.  HENT: Negative for congestion, dental problem, ear pain, hearing loss, sore throat, tinnitus, trouble swallowing and voice change.   Eyes: Negative for pain,  discharge and visual disturbance.  Respiratory: Negative for cough, chest tightness, wheezing and stridor.   Cardiovascular: Negative for chest pain, palpitations and leg swelling.  Gastrointestinal: Negative for abdominal distention, abdominal pain, blood in stool, constipation, diarrhea, nausea and vomiting.  Genitourinary: Negative for difficulty urinating, discharge, flank pain, genital sores, hematuria and urgency.  Musculoskeletal: Positive for arthralgias and joint swelling. Negative for back pain, gait problem, myalgias and neck stiffness.  Skin: Negative for rash.  Neurological: Negative for dizziness, syncope, speech difficulty, weakness, numbness and headaches.  Hematological: Negative for adenopathy. Does not bruise/bleed easily.  Psychiatric/Behavioral: Negative for behavioral problems and dysphoric mood. The patient is not nervous/anxious.        Objective:   Physical Exam  Constitutional: He appears well-developed and well-nourished. No distress.  Musculoskeletal:  Right knee revealed no obvious effusion Suggestion of slight increasing warmth involving the right knee Full range of motion.  Patient unable to fully extend and flex without difficulty No local tenderness or erythema          Assessment & Plan:   Knee pain.  Pain seems to be improving.  Options discussed.  Will continue to moderate activities until completely pain free.  He will take anti-inflammatory medications when necessary.  He will report any new or worsening symptoms.  Consider orthopedic evaluation if knee pain, debilitating or lifestyle limiting  Kenneth Little

## 2016-10-31 NOTE — Patient Instructions (Signed)
Call or return to clinic prn if these symptoms worsen or fail to improve as anticipated.

## 2016-11-08 LAB — VALPROIC ACID LEVEL: VALPROIC ACID LVL: 13.7 mg/L — AB (ref 50.0–100.0)

## 2016-12-16 ENCOUNTER — Ambulatory Visit: Payer: Medicare Other | Admitting: Psychology

## 2017-01-13 DIAGNOSIS — F4323 Adjustment disorder with mixed anxiety and depressed mood: Secondary | ICD-10-CM | POA: Diagnosis not present

## 2017-01-20 ENCOUNTER — Ambulatory Visit: Payer: Medicare Other | Admitting: Psychology

## 2017-01-25 ENCOUNTER — Ambulatory Visit (INDEPENDENT_AMBULATORY_CARE_PROVIDER_SITE_OTHER): Payer: Medicare Other | Admitting: Psychiatry

## 2017-01-25 ENCOUNTER — Encounter (HOSPITAL_COMMUNITY): Payer: Self-pay | Admitting: Psychiatry

## 2017-01-25 VITALS — BP 124/72 | HR 75 | Ht 67.5 in | Wt 182.6 lb

## 2017-01-25 DIAGNOSIS — F3162 Bipolar disorder, current episode mixed, moderate: Secondary | ICD-10-CM | POA: Diagnosis not present

## 2017-01-25 DIAGNOSIS — Z87891 Personal history of nicotine dependence: Secondary | ICD-10-CM

## 2017-01-25 DIAGNOSIS — F3160 Bipolar disorder, current episode mixed, unspecified: Secondary | ICD-10-CM

## 2017-01-25 DIAGNOSIS — Z630 Problems in relationship with spouse or partner: Secondary | ICD-10-CM
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
## 2017-01-27 LAB — VALPROIC ACID LEVEL: Valproic Acid Lvl: 12.8 mg/L — ABNORMAL LOW (ref 50.0–100.0)

## 2017-02-03 ENCOUNTER — Ambulatory Visit (INDEPENDENT_AMBULATORY_CARE_PROVIDER_SITE_OTHER): Payer: Medicare Other | Admitting: Psychology

## 2017-02-03 DIAGNOSIS — F4323 Adjustment disorder with mixed anxiety and depressed mood: Secondary | ICD-10-CM | POA: Diagnosis not present

## 2017-02-10 ENCOUNTER — Ambulatory Visit: Payer: Medicare Other | Admitting: Psychology

## 2017-02-13 ENCOUNTER — Ambulatory Visit: Payer: Medicare Other | Admitting: Psychology

## 2017-02-13 DIAGNOSIS — F4323 Adjustment disorder with mixed anxiety and depressed mood: Secondary | ICD-10-CM

## 2017-02-15 ENCOUNTER — Other Ambulatory Visit (HOSPITAL_COMMUNITY): Payer: Self-pay | Admitting: Psychiatry

## 2017-02-17 ENCOUNTER — Ambulatory Visit: Payer: Medicare Other | Admitting: Psychology

## 2017-02-20 ENCOUNTER — Other Ambulatory Visit (HOSPITAL_COMMUNITY): Payer: Self-pay

## 2017-02-20 MED ORDER — DIVALPROEX SODIUM ER 500 MG PO TB24: ORAL_TABLET | ORAL | 0 refills | Status: DC

## 2017-02-24 ENCOUNTER — Ambulatory Visit: Payer: Medicare Other | Admitting: Psychology

## 2017-03-03 ENCOUNTER — Ambulatory Visit: Payer: Medicare Other | Admitting: Psychology

## 2017-03-03 DIAGNOSIS — F4323 Adjustment disorder with mixed anxiety and depressed mood: Secondary | ICD-10-CM | POA: Diagnosis not present

## 2017-03-17 ENCOUNTER — Other Ambulatory Visit (HOSPITAL_COMMUNITY): Payer: Self-pay | Admitting: Psychiatry

## 2017-03-17 ENCOUNTER — Ambulatory Visit: Payer: Medicare Other | Admitting: Psychology

## 2017-03-17 DIAGNOSIS — F4323 Adjustment disorder with mixed anxiety and depressed mood: Secondary | ICD-10-CM

## 2017-03-21 ENCOUNTER — Other Ambulatory Visit (HOSPITAL_COMMUNITY): Payer: Self-pay

## 2017-03-21 MED ORDER — DIVALPROEX SODIUM ER 500 MG PO TB24: ORAL_TABLET | ORAL | 0 refills | Status: DC

## 2017-03-27 ENCOUNTER — Ambulatory Visit (INDEPENDENT_AMBULATORY_CARE_PROVIDER_SITE_OTHER): Payer: Medicare Other | Admitting: Psychology

## 2017-03-27 DIAGNOSIS — F4323 Adjustment disorder with mixed anxiety and depressed mood: Secondary | ICD-10-CM

## 2017-03-30 ENCOUNTER — Telehealth: Payer: Self-pay | Admitting: Internal Medicine

## 2017-03-30 ENCOUNTER — Encounter (HOSPITAL_COMMUNITY): Payer: Self-pay | Admitting: Psychiatry

## 2017-03-30 ENCOUNTER — Ambulatory Visit (HOSPITAL_COMMUNITY): Payer: Medicare Other | Admitting: Psychiatry

## 2017-03-30 VITALS — BP 126/66 | HR 75 | Ht 67.5 in | Wt 182.0 lb

## 2017-03-30 DIAGNOSIS — F4324 Adjustment disorder with disturbance of conduct: Secondary | ICD-10-CM

## 2017-03-30 DIAGNOSIS — Z79899 Other long term (current) drug therapy: Secondary | ICD-10-CM

## 2017-03-30 DIAGNOSIS — Z008 Encounter for other general examination: Secondary | ICD-10-CM

## 2017-03-30 MED ORDER — DIVALPROEX SODIUM ER 500 MG PO TB24: ORAL_TABLET | ORAL | 5 refills | Status: DC

## 2017-03-30 NOTE — Progress Notes (Signed)
Psychiatric Initial Adult Assessment   Patient Identification: Kenneth Little MRN:  [id number] Date of Evaluation:  03/30/2017 Referral Source: Self referral Chief Complaint:    Visit Diagnosis:  No diagnosis found. At this time I believe this patient is stable. Is able to socialize some. He plays mah-jongg or other people. He stays active. He simply misses his grandchildren a great deal. He talks on on about he is debilitated and in all the things these done. He stays active. She sleeping and eating fairly well. He's injured his knee which is recovered now. It is my hope he gets back to basketballwith other people. Patient shows no evidence of psychosis. He does not drink alcohol or use any drugs. He attempts to say fit. The patient is not suicidal nor is he homicidal he continues in therapy. Psychotic Symptoms:   PTSD Symptoms:   Past Psychiatric History:   Previous Psychotropic Medications: No   Substance Abuse History in the last 12 months:  No.  Consequences of Substance Abuse:   Past Medical History:  Past Medical History:  Diagnosis Date  . Diabetes mellitus without complication ([place])   . Enlarged prostate   . Hyperlipidemia   . Hypertension   . UTI (lower urinary tract infection)     Past Surgical History:  Procedure Laterality Date  . CHOLECYSTECTOMY N/A 12/24/2012   Procedure: LAPAROSCOPIC CHOLECYSTECTOMY WITH INTRAOPERATIVE CHOLANGIOGRAM;  Surgeon: Shann Medal, MD;  [facility];  Service: General;  Laterality: N/A;  . CYSTOSCOPY WITH BIOPSY N/A 03/22/2016   Procedure: CYSTOSCOPY WITH BLADDER  BIOPSY WITH FULGURATION;  Surgeon: Irine Seal, MD;  Location: [place];  Service: Urology;  Laterality: N/A;  . diverticulitis    . ERCP N/A 12/23/2012   Procedure: ENDOSCOPIC RETROGRADE CHOLANGIOPANCREATOGRAPHY (ERCP);  Surgeon: Jeryl Columbia, MD;  [facility];  Service: Endoscopy;  Laterality: N/A;  . HEMORRHOID SURGERY  03/20/12   internal  . HERNIA  REPAIR Bilateral    childhood    Family Psychiatric History:   Family History:  Family History  Problem Relation Age of Onset  . Cancer Mother        breast  . Hypertension Father   . Diabetes Brother     Social History:   Social History   Socioeconomic History  . Marital status: Married    Spouse name: Not on file  . Number of children: Not on file  . Years of education: Not on file  . Highest education level: Not on file  Occupational History  . Not on file  Social Needs  . Financial resource strain: Not on file  . Food insecurity:    Worry: Not on file    Inability: Not on file  . Transportation needs:    Medical: Not on file    Non-medical: Not on file  Tobacco Use  . Smoking status: Former Smoker    Types: Cigarettes    Last attempt to quit: 11/20/1988    Years since quitting: 28.3  . Smokeless tobacco: Never Used  Substance and Sexual Activity  . Alcohol use: No    Alcohol/week: 0.0 oz  . Drug use: No  . Sexual activity: Yes    Partners: Female  Lifestyle  . Physical activity:    Days per week: Not on file    Minutes per session: Not on file  . Stress: Not on file  Relationships  . Social connections:    Talks on phone: Not on file  Gets together: Not on file    Attends religious service: Not on file    Active member of club or organization: Not on file    Attends meetings of clubs or organizations: Not on file    Relationship status: Not on file  Other Topics Concern  . Not on file  Social History Narrative  . Not on file    Additional Social History:   Allergies:   Allergies  Allergen Reactions  . Ace Inhibitors Cough and Itching  . Hydrochlorothiazide Cough and Itching    Metabolic Disorder Labs: Lab Results  Component Value Date   HGBA1C 7.0 (H) 08/21/2015   No results found for: PROLACTIN Lab Results  Component Value Date   CHOL 94 08/21/2015   TRIG 70.0 08/21/2015   HDL 42.60 08/21/2015   CHOLHDL 2 08/21/2015   VLDL  14.0 08/21/2015   LDLCALC 38 08/21/2015   LDLCALC 37 06/20/2014     Current Medications: Current Outpatient Medications  Medication Sig Dispense Refill  . alfuzosin (UROXATRAL) 10 MG 24 hr tablet Take 10 mg by mouth every evening.    Marland Kitchen aspirin EC 81 MG tablet Take 81 mg by mouth every morning.     Marland Kitchen atorvastatin (LIPITOR) 40 MG tablet Take 40 mg by mouth every evening.     . calcium-vitamin D (OSCAL WITH D) 500-200 MG-UNIT per tablet Take 1 tablet by mouth every morning.    . divalproex (DEPAKOTE ER) 500 MG 24 hr tablet 2 qam 60 tablet 5  . insulin glargine (LANTUS) 100 UNIT/ML injection Inject 25 Units into the skin at bedtime.     . INVOKANA 300 MG TABS Take 1 tablet by mouth every morning.     . metFORMIN (GLUCOPHAGE) 1000 MG tablet Take 1,000 mg by mouth 2 (two) times daily with a meal.    . olmesartan (BENICAR) 40 MG tablet Take 40 mg by mouth daily.    Glory Rosebush VERIO test strip     . pioglitazone (ACTOS) 45 MG tablet Take 45 mg by mouth every morning.     . sitaGLIPtin (JANUVIA) 100 MG tablet Take 100 mg by mouth every evening.     . TURMERIC PO Take by mouth.    . Vitamin D, Ergocalciferol, (DRISDOL) 50000 units CAPS capsule Take 50,000 Units by mouth every 7 (seven) days.     No current facility-administered medications for this visit.     Neurologic: Headache: No Seizure: No Paresthesias:No  Musculoskeletal: Strength & Muscle Tone: within normal limits Gait & Station: normal Patient leans: N/A  Psychiatric Specialty Exam: ROS  Blood pressure 126/66, pulse 75, height 5' 7.5" (1.715 m), weight 182 lb (82.6 kg).Body mass index is 28.08 kg/m.  General Appearance: Casual  Eye Contact:  Good  Speech:  Clear and Coherent  Volume:  Normal  Mood:  Euthymic  Affect:  Appropriate  Thought Process:  Coherent  Orientation:  NA  Thought Content:  Logical  Suicidal Thoughts:  No  Homicidal Thoughts:  No  Memory:  NA  Judgement:  Good  Insight:  Fair  Psychomotor  Activity:  Normal  Concentration:    Recall:  Good  Fund of Knowledge:Fair  Language: Good  Akathisia:  No  Handed:  Right  AIMS (if indicated):    Assets:  Communication Skills  ADL's:  Intact  Cognition: WNL  Sleep:      Treatment Plan Summary: 3/21/20194:20 PM  Recently he is written a letter to his daughter. He is doing  everything he can to make ammends. The patient is not aggressive. He is not depressed. He's been taking the Depakote as prescribed and he does admit that he is better. He is less her age. Experiences himself being less irritable. Curiously his blood level still remains quite low which I think is a metabolic related to his genetics. At this time I've chosen not to raise the dose. I have no evidence that he is irritable.the patient is getting along with everybody else around. He stays active. He loves television watching basketball. All he would wish is another chance to try to interact with his grandchildren and try to make with his daughter. Today the patient is seen alone. Return to see me in 4 months. Continue taking the medicines as prescribed and continue in therapy.

## 2017-03-30 NOTE — Telephone Encounter (Signed)
Patient needs to know if he needs a shingrix injection.  The patient would also like for Dr. Raliegh Ip to call him on either number listed  Please Advise

## 2017-03-31 ENCOUNTER — Ambulatory Visit: Payer: Medicare Other | Admitting: Psychology

## 2017-03-31 NOTE — Telephone Encounter (Signed)
Spoke to patient and informed him that the vaccine is recommended for people 60 and up. Patient verbalized understanding. Patient also wanted me to give Dr.kwiatkowski a heads up on him stopping by with a box of golf balls.

## 2017-04-14 ENCOUNTER — Ambulatory Visit: Payer: Medicare Other | Admitting: Psychology

## 2017-04-14 DIAGNOSIS — F4323 Adjustment disorder with mixed anxiety and depressed mood: Secondary | ICD-10-CM

## 2017-05-10 ENCOUNTER — Ambulatory Visit: Payer: Medicare Other | Admitting: Psychology

## 2017-05-10 DIAGNOSIS — F4323 Adjustment disorder with mixed anxiety and depressed mood: Secondary | ICD-10-CM

## 2017-05-24 ENCOUNTER — Ambulatory Visit: Payer: Medicare Other | Admitting: Psychology

## 2017-05-24 DIAGNOSIS — F4323 Adjustment disorder with mixed anxiety and depressed mood: Secondary | ICD-10-CM | POA: Diagnosis not present

## 2017-06-07 ENCOUNTER — Ambulatory Visit (INDEPENDENT_AMBULATORY_CARE_PROVIDER_SITE_OTHER): Payer: Medicare Other | Admitting: Psychology

## 2017-06-07 DIAGNOSIS — F4323 Adjustment disorder with mixed anxiety and depressed mood: Secondary | ICD-10-CM

## 2017-07-05 ENCOUNTER — Ambulatory Visit: Payer: Medicare Other | Admitting: Psychology

## 2017-07-05 DIAGNOSIS — F4323 Adjustment disorder with mixed anxiety and depressed mood: Secondary | ICD-10-CM

## 2017-07-19 ENCOUNTER — Ambulatory Visit (INDEPENDENT_AMBULATORY_CARE_PROVIDER_SITE_OTHER): Payer: Medicare Other | Admitting: Psychology

## 2017-07-19 DIAGNOSIS — F4323 Adjustment disorder with mixed anxiety and depressed mood: Secondary | ICD-10-CM

## 2017-08-02 ENCOUNTER — Ambulatory Visit (HOSPITAL_COMMUNITY): Payer: Medicare Other | Admitting: Psychiatry

## 2017-08-02 ENCOUNTER — Encounter (HOSPITAL_COMMUNITY): Payer: Self-pay | Admitting: Psychiatry

## 2017-08-02 ENCOUNTER — Ambulatory Visit: Payer: Medicare Other | Admitting: Psychology

## 2017-08-02 VITALS — BP 122/70 | HR 68 | Ht 67.5 in | Wt 179.0 lb

## 2017-08-02 DIAGNOSIS — F4323 Adjustment disorder with mixed anxiety and depressed mood: Secondary | ICD-10-CM | POA: Diagnosis not present

## 2017-08-02 MED ORDER — DIVALPROEX SODIUM ER 500 MG PO TB24: ORAL_TABLET | ORAL | 5 refills | Status: DC

## 2017-08-02 NOTE — Progress Notes (Signed)
Psychiatric Initial Adult Assessment   Patient Identification: Kenneth Little MRN:  [id number] Date of Evaluation:  08/02/2017 Referral Source: Self referral Chief Complaint:    Visit Diagnosis:  No diagnosis found. Today the patient is stable. He actually feels calmer. His aneurysm reduced. He says a letter to his daughter got no response. His daughter seems more distant. His daughter's name is Kenneth Little seems to be quite controlling. She controls her husband at this time manages the patient's wife is well. The patient's wife is made some contact with this patient but it is not clear if she truly wants to see this patient. I think the aneurysm less in the family but I think Kenneth Little still maintains a wall. The patient is sleeping and eating well. He's taking care of himself. He has friends and plays [place]. This patient drinks no alcohol uses no drugs. He demonstrates no evidence of psychosis. He sees his therapist Dr. Cheryln Little regularly. At this time the patient had little contact with his grandchildren for over a year. The patient has agreed to allow me to call Kenneth Little to invite her to come to our next meeting to her how she thinks her father is doing. I think she is actively doing well. He goes to a support group for Alzheimer's dementiagoes to therapy still has a social life and takes medicines just as prescribed. Psychotic Symptoms:   PTSD Symptoms:   Past Psychiatric History:   Previous Psychotropic Medications: No   Substance Abuse History in the last 12 months:  No.  Consequences of Substance Abuse:   Past Medical History:  Past Medical History:  Diagnosis Date  . Diabetes mellitus without complication ([place])   . Enlarged prostate   . Hyperlipidemia   . Hypertension   . UTI (lower urinary tract infection)     Past Surgical History:  Procedure Laterality Date  . CHOLECYSTECTOMY N/A 12/24/2012   Procedure: LAPAROSCOPIC CHOLECYSTECTOMY WITH INTRAOPERATIVE CHOLANGIOGRAM;  Surgeon:  Shann Medal, MD;  [facility];  Service: General;  Laterality: N/A;  . CYSTOSCOPY WITH BIOPSY N/A 03/22/2016   Procedure: CYSTOSCOPY WITH BLADDER  BIOPSY WITH FULGURATION;  Surgeon: Irine Seal, MD;  Location: [place];  Service: Urology;  Laterality: N/A;  . diverticulitis    . ERCP N/A 12/23/2012   Procedure: ENDOSCOPIC RETROGRADE CHOLANGIOPANCREATOGRAPHY (ERCP);  Surgeon: Jeryl Columbia, MD;  [facility];  Service: Endoscopy;  Laterality: N/A;  . HEMORRHOID SURGERY  03/20/12   internal  . HERNIA REPAIR Bilateral    childhood    Family Psychiatric History:   Family History:  Family History  Problem Relation Age of Onset  . Cancer Mother        breast  . Hypertension Father   . Diabetes Brother     Social History:   Social History   Socioeconomic History  . Marital status: Married    Spouse name: Not on file  . Number of children: Not on file  . Years of education: Not on file  . Highest education level: Not on file  Occupational History  . Not on file  Social Needs  . Financial resource strain: Not on file  . Food insecurity:    Worry: Not on file    Inability: Not on file  . Transportation needs:    Medical: Not on file    Non-medical: Not on file  Tobacco Use  . Smoking status: Former Smoker    Types: Cigarettes    Last attempt to  quit: 11/20/1988    Years since quitting: 28.7  . Smokeless tobacco: Never Used  Substance and Sexual Activity  . Alcohol use: No    Alcohol/week: 0.0 oz  . Drug use: No  . Sexual activity: Yes    Partners: Female  Lifestyle  . Physical activity:    Days per week: Not on file    Minutes per session: Not on file  . Stress: Not on file  Relationships  . Social connections:    Talks on phone: Not on file    Gets together: Not on file    Attends religious service: Not on file    Active member of club or organization: Not on file    Attends meetings of clubs or organizations: Not on file     Relationship status: Not on file  Other Topics Concern  . Not on file  Social History Narrative  . Not on file    Additional Social History:   Allergies:   Allergies  Allergen Reactions  . Ace Inhibitors Cough and Itching  . Hydrochlorothiazide Cough and Itching    Metabolic Disorder Labs: Lab Results  Component Value Date   HGBA1C 7.0 (H) 08/21/2015   No results found for: PROLACTIN Lab Results  Component Value Date   CHOL 94 08/21/2015   TRIG 70.0 08/21/2015   HDL 42.60 08/21/2015   CHOLHDL 2 08/21/2015   VLDL 14.0 08/21/2015   LDLCALC 38 08/21/2015   LDLCALC 37 06/20/2014     Current Medications: Current Outpatient Medications  Medication Sig Dispense Refill  . alfuzosin (UROXATRAL) 10 MG 24 hr tablet Take 10 mg by mouth every evening.    Marland Kitchen aspirin EC 81 MG tablet Take 81 mg by mouth every morning.     Marland Kitchen atorvastatin (LIPITOR) 40 MG tablet Take 40 mg by mouth every evening.     . calcium-vitamin D (OSCAL WITH D) 500-200 MG-UNIT per tablet Take 1 tablet by mouth every morning.    . divalproex (DEPAKOTE ER) 500 MG 24 hr tablet 2 qam 60 tablet 5  . insulin glargine (LANTUS) 100 UNIT/ML injection Inject 25 Units into the skin at bedtime.     . INVOKANA 300 MG TABS Take 1 tablet by mouth every morning.     . metFORMIN (GLUCOPHAGE) 1000 MG tablet Take 1,000 mg by mouth 2 (two) times daily with a meal.    . olmesartan (BENICAR) 40 MG tablet Take 40 mg by mouth daily.    Glory Rosebush VERIO test strip     . pioglitazone (ACTOS) 45 MG tablet Take 45 mg by mouth every morning.     . sitaGLIPtin (JANUVIA) 100 MG tablet Take 100 mg by mouth every evening.     . TURMERIC PO Take by mouth.    . Vitamin D, Ergocalciferol, (DRISDOL) 50000 units CAPS capsule Take 50,000 Units by mouth every 7 (seven) days.    Marland Kitchen MYRBETRIQ 50 MG TB24 tablet      No current facility-administered medications for this visit.     Neurologic: Headache: No Seizure:  No Paresthesias:No  Musculoskeletal: Strength & Muscle Tone: within normal limits Gait & Station: normal Patient leans: N/A  Psychiatric Specialty Exam: ROS  Blood pressure 122/70, pulse 68, height 5' 7.5" (1.715 m), weight 179 lb (81.2 kg).Body mass index is 27.62 kg/m.  General Appearance: Casual  Eye Contact:  Good  Speech:  Clear and Coherent  Volume:  Normal  Mood:  Euthymic  Affect:  Appropriate  Thought Process:  Coherent  Orientation:  NA  Thought Content:  Logical  Suicidal Thoughts:  No  Homicidal Thoughts:  No  Memory:  NA  Judgement:  Good  Insight:  Fair  Psychomotor Activity:  Normal  Concentration:    Recall:  Good  Fund of Knowledge:Fair  Language: Good  Akathisia:  No  Handed:  Right  AIMS (if indicated):    Assets:  Communication Skills  ADL's:  Intact  Cognition: WNL  Sleep:      Treatment Plan Summary: 7/24/20192:53 PM  At this timethe patient is stable. Is not aggressive or violent. Is less angry. He is less impulsive. Is taking it thousand milligrams of Depakote just as prescribed and overall he says is helpful. The patient is not suicidal nor is he homicidal. We she'll plan to meet again in 2 months and at that time I will call his daughter Kenneth Little to ask if she can join [place].

## 2017-08-16 ENCOUNTER — Ambulatory Visit: Payer: Medicare Other | Admitting: Psychology

## 2017-08-16 DIAGNOSIS — F4323 Adjustment disorder with mixed anxiety and depressed mood: Secondary | ICD-10-CM

## 2017-08-30 ENCOUNTER — Ambulatory Visit: Payer: Medicare Other | Admitting: Psychology

## 2017-08-30 DIAGNOSIS — F4323 Adjustment disorder with mixed anxiety and depressed mood: Secondary | ICD-10-CM

## 2017-09-05 ENCOUNTER — Ambulatory Visit (INDEPENDENT_AMBULATORY_CARE_PROVIDER_SITE_OTHER): Payer: Medicare Other | Admitting: Internal Medicine

## 2017-09-05 ENCOUNTER — Encounter: Payer: Self-pay | Admitting: Internal Medicine

## 2017-09-05 VITALS — BP 100/52 | HR 72 | Temp 98.2°F | Ht 66.5 in | Wt 176.2 lb

## 2017-09-05 DIAGNOSIS — Z0000 Encounter for general adult medical examination without abnormal findings: Secondary | ICD-10-CM

## 2017-09-05 DIAGNOSIS — E119 Type 2 diabetes mellitus without complications: Secondary | ICD-10-CM

## 2017-09-05 DIAGNOSIS — Z794 Long term (current) use of insulin: Secondary | ICD-10-CM | POA: Diagnosis not present

## 2017-09-05 MED ORDER — PIOGLITAZONE HCL 45 MG PO TABS: 45.0000 mg | ORAL_TABLET | Freq: Every morning | ORAL | 5 refills | Status: AC

## 2017-09-05 MED ORDER — OLMESARTAN MEDOXOMIL 40 MG PO TABS: 40.0000 mg | ORAL_TABLET | Freq: Every day | ORAL | 4 refills | Status: AC

## 2017-09-05 MED ORDER — SITAGLIPTIN PHOSPHATE 100 MG PO TABS: 100.0000 mg | ORAL_TABLET | Freq: Every evening | ORAL | 5 refills | Status: AC

## 2017-09-05 MED ORDER — OXYBUTYNIN CHLORIDE ER 5 MG PO TB24: 5.0000 mg | ORAL_TABLET | Freq: Every day | ORAL | 4 refills | Status: DC

## 2017-09-05 MED ORDER — ATORVASTATIN CALCIUM 40 MG PO TABS: 40.0000 mg | ORAL_TABLET | Freq: Every evening | ORAL | 5 refills | Status: AC

## 2017-09-05 NOTE — Patient Instructions (Signed)
Limit your sodium (Salt) intake   Please check your hemoglobin A1c every 3 months    It is important that you exercise regularly, at least 20 minutes 3 to 4 times per week.  If you develop chest pain or shortness of breath seek  medical attention.  Please see your eye doctor yearly to check for diabetic eye damage  

## 2017-09-05 NOTE — Progress Notes (Signed)
Subjective:    Patient ID: Kenneth Little, male    DOB: February 17, 1945, 72 y.o.   MRN: [id number]  HPI  72 year old patient who is seen today for a annual preventive health examination He is doing quite well.  He is followed quarterly by endocrinology for type 2 diabetes.  This has been very well controlled over the past year hemoglobin A1c's have trended down in 3 months ago was 6.6 He has a history of BPH and has been followed by urology.  He was seen by Dr. Roni Bread 2 weeks ago.  Evaluation has included cystoscopy. His only complaint is some mild right knee discomfort. Did have a diabetic eye examination last year and is scheduled for follow-up September 13, 2017  Past Medical History:  Diagnosis Date  . Diabetes mellitus without complication ([place])   . Enlarged prostate   . Hyperlipidemia   . Hypertension   . UTI (lower urinary tract infection)      Social History   Socioeconomic History  . Marital status: Married    Spouse name: Not on file  . Number of children: Not on file  . Years of education: Not on file  . Highest education level: Not on file  Occupational History  . Not on file  Social Needs  . Financial resource strain: Not on file  . Food insecurity:    Worry: Not on file    Inability: Not on file  . Transportation needs:    Medical: Not on file    Non-medical: Not on file  Tobacco Use  . Smoking status: Former Smoker    Types: Cigarettes    Last attempt to quit: 11/20/1988    Years since quitting: 28.8  . Smokeless tobacco: Never Used  Substance and Sexual Activity  . Alcohol use: No    Alcohol/week: 0.0 standard drinks  . Drug use: No  . Sexual activity: Yes    Partners: Female  Lifestyle  . Physical activity:    Days per week: Not on file    Minutes per session: Not on file  . Stress: Not on file  Relationships  . Social connections:    Talks on phone: Not on file    Gets together: Not on file    Attends religious service: Not on file    Active member  of club or organization: Not on file    Attends meetings of clubs or organizations: Not on file    Relationship status: Not on file  . Intimate partner violence:    Fear of current or ex partner: Not on file    Emotionally abused: Not on file    Physically abused: Not on file    Forced sexual activity: Not on file  Other Topics Concern  . Not on file  Social History Narrative  . Not on file    Past Surgical History:  Procedure Laterality Date  . CHOLECYSTECTOMY N/A 12/24/2012   Procedure: LAPAROSCOPIC CHOLECYSTECTOMY WITH INTRAOPERATIVE CHOLANGIOGRAM;  Surgeon: Shann Medal, MD;  Location: [facility];  Service: General;  Laterality: N/A;  . CYSTOSCOPY WITH BIOPSY N/A 03/22/2016   Procedure: CYSTOSCOPY WITH BLADDER  BIOPSY WITH FULGURATION;  Surgeon: Irine Seal, MD;  Location: [facility];  Service: Urology;  Laterality: N/A;  . diverticulitis    . ERCP N/A 12/23/2012   Procedure: ENDOSCOPIC RETROGRADE CHOLANGIOPANCREATOGRAPHY (ERCP);  Surgeon: Jeryl Columbia, MD;  Location: [facility];  Service: Endoscopy;  Laterality: N/A;  . HEMORRHOID SURGERY  03/20/12  internal  . HERNIA REPAIR Bilateral    childhood    Family History  Problem Relation Age of Onset  . Cancer Mother        breast  . Hypertension Father   . Diabetes Brother     Allergies  Allergen Reactions  . Ace Inhibitors Cough and Itching  . Hydrochlorothiazide Cough and Itching    Current Outpatient Medications on File Prior to Visit  Medication Sig Dispense Refill  . alfuzosin (UROXATRAL) 10 MG 24 hr tablet Take 10 mg by mouth every evening.    . calcium-vitamin D (OSCAL WITH D) 500-200 MG-UNIT per tablet Take 1 tablet by mouth every morning.    . divalproex (DEPAKOTE ER) 500 MG 24 hr tablet 2 qam 60 tablet 5  . insulin glargine (LANTUS) 100 UNIT/ML injection Inject 25 Units into the skin at bedtime.     . INVOKANA 300 MG TABS Take 1 tablet by mouth every morning.     . metFORMIN (GLUCOPHAGE) 1000 MG  tablet Take 1,000 mg by mouth 2 (two) times daily with a meal.    . ONETOUCH VERIO test strip     . TURMERIC PO Take by mouth.    . Vitamin D, Ergocalciferol, (DRISDOL) 50000 units CAPS capsule Take 50,000 Units by mouth every 7 (seven) days.     No current facility-administered medications on file prior to visit.     BP (!) 100/52 (BP Location: Right Arm, Patient Position: Sitting, Cuff Size: Large)   Pulse 72   Temp 98.2 F (36.8 C) (Oral)   Ht 5' 6.5" (1.689 m)   Wt 176 lb 3.2 oz (79.9 kg)   SpO2 94%   BMI 28.01 kg/m     Review of Systems  Constitutional: Negative for appetite change, chills, fatigue and fever.  HENT: Negative for congestion, dental problem, ear pain, hearing loss, sore throat, tinnitus, trouble swallowing and voice change.   Eyes: Negative for pain, discharge and visual disturbance.  Respiratory: Negative for cough, chest tightness, wheezing and stridor.   Cardiovascular: Negative for chest pain, palpitations and leg swelling.  Gastrointestinal: Negative for abdominal distention, abdominal pain, blood in stool, constipation, diarrhea, nausea and vomiting.  Genitourinary: Negative for difficulty urinating, discharge, flank pain, genital sores, hematuria and urgency.  Musculoskeletal: Negative for arthralgias, back pain, gait problem, joint swelling, myalgias and neck stiffness.       Mild right knee pain which is improving  Skin: Negative for rash.  Neurological: Negative for dizziness, syncope, speech difficulty, weakness, numbness and headaches.  Hematological: Negative for adenopathy. Does not bruise/bleed easily.  Psychiatric/Behavioral: Negative for behavioral problems and dysphoric mood. The patient is not nervous/anxious.        Objective:   Physical Exam  Constitutional: He appears well-developed and well-nourished.  HENT:  Head: Normocephalic and atraumatic.  Right Ear: External ear normal.  Left Ear: External ear normal.  Nose: Nose normal.    Mouth/Throat: Oropharynx is clear and moist.  Eyes: Pupils are equal, round, and reactive to light. Conjunctivae and EOM are normal. No scleral icterus.  Neck: Normal range of motion. Neck supple. No JVD present. No thyromegaly present.  Cardiovascular: Regular rhythm, normal heart sounds and intact distal pulses. Exam reveals no gallop and no friction rub.  No murmur heard. Pulmonary/Chest: Effort normal and breath sounds normal. He exhibits no tenderness.  Abdominal: Soft. Bowel sounds are normal. He exhibits no distension and no mass. There is no tenderness.  Lower transverse abdominal scar  Genitourinary:  Penis normal.  Musculoskeletal: Normal range of motion. He exhibits no edema or tenderness.  Right knee minimally warm to touch but no obvious effusion  Lymphadenopathy:    He has no cervical adenopathy.  Neurological: He is alert. He has normal reflexes. No cranial nerve deficit. Coordination normal.  Skin: Skin is warm and dry. No rash noted.  Scattered lipoma most prominent over the abdominal wall  Psychiatric: He has a normal mood and affect. His behavior is normal.          Assessment & Plan:   Preventive health examination.  Last colonoscopy 2015 History of BPH.  Follow-up Dr. Roni Bread Type 2 diabetes.  Patient is followed quarterly by endocrinology Essential hypertension well-controlled Dyslipidemia continue statin therapy  Patient will follow-up with a new provider in 6 months Endocrinology follow-up as scheduled  Marletta Lor

## 2017-09-13 LAB — HM DIABETES EYE EXAM

## 2017-09-22 ENCOUNTER — Encounter (HOSPITAL_COMMUNITY): Payer: Self-pay | Admitting: Psychiatry

## 2017-09-22 ENCOUNTER — Ambulatory Visit (HOSPITAL_COMMUNITY): Payer: Medicare Other | Admitting: Psychiatry

## 2017-09-22 VITALS — BP 126/74 | Ht 67.5 in | Wt 180.0 lb

## 2017-09-22 DIAGNOSIS — Z87891 Personal history of nicotine dependence: Secondary | ICD-10-CM

## 2017-09-22 DIAGNOSIS — F4323 Adjustment disorder with mixed anxiety and depressed mood: Secondary | ICD-10-CM | POA: Diagnosis not present

## 2017-09-22 MED ORDER — DIVALPROEX SODIUM ER 500 MG PO TB24: ORAL_TABLET | ORAL | 5 refills | Status: DC

## 2017-09-22 NOTE — Progress Notes (Signed)
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   Today the patient is at his baseline. I shared with him that I spoke to his daughter and that she described the patient as being very demanding controlling and to got violent at least 2 times in the past years. [name] is very frightened of her father. The father is made good faith efforts to try to repair things. This includes that he has had no contact with them for longer than a year. The patient himself is going to some support groups for dealing with Alzheimer's dementia. The patient is very concerned about his wife is living with [name]. He's not sure she's getting good care. The biggest issue is the patient feels very lonely and misses his wife and his grandchildren. They're clear lots of family dynamics involved in this. According the patient his daughter [name] is as aggressive or more aggressive that he is. The patient feels that he is taken for granted. He doesn't feel that his family specifically [name] appreciates all of these.for them. The patient could just about it made that he was aggressive at least 2 times and I shared with him that being aggressive even 2 times would make everybody feel uncomfortable. The patient denies being depressed. He is not suicidal nor is he homicidal or he denies the use of alcohol or drugs. He denies chest pain or shortness of breath or any neurological symptoms. Is angry at is actually well contained just related to his daughter and the circumstances. I think there are a number of colds all issues with this Asian gentleman. At this time the patient has created a small but significant support system. He takes his Depakote just as prescribed. He continues in therapy.  PTSD Symptoms:   Past  Psychiatric History:   Previous Psychotropic Medications: No   Substance Abuse History in the last 12 months:  No.  Consequences of Substance Abuse:   Past Medical History:  Past Medical History:  Diagnosis Date  . Diabetes mellitus without complication ([place])   . Enlarged prostate   . Hyperlipidemia   . Hypertension   . UTI (lower urinary tract infection)     Past Surgical History:  Procedure Laterality Date  . CHOLECYSTECTOMY N/A 12/24/2012   Procedure: LAPAROSCOPIC CHOLECYSTECTOMY WITH INTRAOPERATIVE CHOLANGIOGRAM;  Surgeon: Shann Medal, MD;  Location: [facility];  Service: General;  Laterality: N/A;  . CYSTOSCOPY WITH BIOPSY N/A 03/22/2016   Procedure: CYSTOSCOPY WITH BLADDER  BIOPSY WITH FULGURATION;  Surgeon: Irine Seal, MD;  Location: [facility];  Service: Urology;  Laterality: N/A;  . diverticulitis    . ERCP N/A 12/23/2012   Procedure: ENDOSCOPIC RETROGRADE CHOLANGIOPANCREATOGRAPHY (ERCP);  Surgeon: Jeryl Columbia, MD;  Location: [facility];  Service: Endoscopy;  Laterality: N/A;  . HEMORRHOID SURGERY  03/20/12   internal  . HERNIA REPAIR Bilateral    childhood    Family Psychiatric History:   Family History:  Family History  Problem Relation Age of Onset  . Cancer Mother        breast  . Hypertension Father   . Diabetes Brother     Social History:   Social History   Socioeconomic History  . Marital status: Married    Spouse name: Not on file  . Number of children: Not on file  .  Years of education: Not on file  . Highest education level: Not on file  Occupational History  . Not on file  Social Needs  . Financial resource strain: Not on file  . Food insecurity:    Worry: Not on file    Inability: Not on file  . Transportation needs:    Medical: Not on file    Non-medical: Not on file  Tobacco Use  . Smoking status: Former Smoker    Types: Cigarettes    Last attempt to quit: 11/20/1988    Years since quitting: 28.8  . Smokeless  tobacco: Never Used  Substance and Sexual Activity  . Alcohol use: No    Alcohol/week: 0.0 standard drinks  . Drug use: No  . Sexual activity: Yes    Partners: Female  Lifestyle  . Physical activity:    Days per week: Not on file    Minutes per session: Not on file  . Stress: Not on file  Relationships  . Social connections:    Talks on phone: Not on file    Gets together: Not on file    Attends religious service: Not on file    Active member of club or organization: Not on file    Attends meetings of clubs or organizations: Not on file    Relationship status: Not on file  Other Topics Concern  . Not on file  Social History Narrative  . Not on file    Additional Social History:   Allergies:   Allergies  Allergen Reactions  . Ace Inhibitors Cough and Itching  . Hydrochlorothiazide Cough and Itching    Metabolic Disorder Labs: Lab Results  Component Value Date   HGBA1C 7.0 (H) 08/21/2015   No results found for: PROLACTIN Lab Results  Component Value Date   CHOL 94 08/21/2015   TRIG 70.0 08/21/2015   HDL 42.60 08/21/2015   CHOLHDL 2 08/21/2015   VLDL 14.0 08/21/2015   LDLCALC 38 08/21/2015   LDLCALC 37 06/20/2014     Current Medications: Current Outpatient Medications  Medication Sig Dispense Refill  . alfuzosin (UROXATRAL) 10 MG 24 hr tablet Take 10 mg by mouth every evening.    Marland Kitchen atorvastatin (LIPITOR) 40 MG tablet Take 1 tablet (40 mg total) by mouth every evening. 90 tablet 5  . calcium-vitamin D (OSCAL WITH D) 500-200 MG-UNIT per tablet Take 1 tablet by mouth every morning.    . divalproex (DEPAKOTE ER) 500 MG 24 hr tablet 2 qam 60 tablet 5  . insulin glargine (LANTUS) 100 UNIT/ML injection Inject 25 Units into the skin at bedtime.     . INVOKANA 300 MG TABS Take 1 tablet by mouth every morning.     . metFORMIN (GLUCOPHAGE) 1000 MG tablet Take 1,000 mg by mouth 2 (two) times daily with a meal.    . olmesartan (BENICAR) 40 MG tablet Take 1 tablet (40 mg  total) by mouth daily. 90 tablet 4  . ONETOUCH VERIO test strip     . oxybutynin (DITROPAN-XL) 5 MG 24 hr tablet Take 1 tablet (5 mg total) by mouth at bedtime. 60 tablet 4  . pioglitazone (ACTOS) 45 MG tablet Take 1 tablet (45 mg total) by mouth every morning. 90 tablet 5  . sitaGLIPtin (JANUVIA) 100 MG tablet Take 1 tablet (100 mg total) by mouth every evening. 90 tablet 5  . TURMERIC PO Take by mouth.    . Vitamin D, Ergocalciferol, (DRISDOL) 50000 units CAPS capsule Take 50,000 Units by  mouth every 7 (seven) days.     No current facility-administered medications for this visit.     Neurologic: Headache: No Seizure: No Paresthesias:No  Musculoskeletal: Strength & Muscle Tone: within normal limits Gait & Station: normal Patient leans: N/A  Psychiatric Specialty Exam: ROS  Blood pressure 126/74, height 5' 7.5" (1.715 m), weight 180 lb (81.6 kg).Body mass index is 27.78 kg/m.  General Appearance: Casual  Eye Contact:  Good  Speech:  Clear and Coherent  Volume:  Normal  Mood:  Euthymic  Affect:  Appropriate  Thought Process:  Coherent  Orientation:  NA  Thought Content:  Logical  Suicidal Thoughts:  No  Homicidal Thoughts:  No  Memory:  NA  Judgement:  Good  Insight:  Fair  Psychomotor Activity:  Normal  Concentration:    Recall:  Good  Fund of Knowledge:Fair  Language: Good  Akathisia:  No  Handed:  Right  AIMS (if indicated):    Assets:  Communication Skills  ADL's:  Intact  Cognition: WNL  Sleep:      Treatment Plan Summary: 9/13/201912:01 PM  This patient is #1 psychosocial stressor issue is his aggressive behavior that he demonstrated to his daughter. Since then she is frightened that and thinks that he's overcontrolling. She doesn't want to witnessed future aggressive behavior. The daughter apparently is concerned that this patient would be aggressive with her demented mother. I not clear how the mother is actually. The patient for his conduct disturbance  is simply an overreaction do believe the Depakote is taking even psychologically part of a treatment plan to demonstrate to his daughter and to others that he is trying to get better and not be so reactive. This is demonstrated by the fact that he is left everybody alone for over a year. By mistake his wife actually called him a few months ago. This was never dealt with. The other individual in the story is while he patient's son. While he is very involved with the mother as well. Today the patient agreed that he overreacted and was overly aggressive events. The daughter has said that he would just acknowledge this and admit to a she would consider having some contact with him. The patient gave me permission to call her daughter at the telephone number [phone].Rene Paci also got permission to call the son while he is telephone number is [phone]. I will discuss with the daughter the patient is willing to acknowledge that he was overly aggressiveand offer the daughter a chance to come back to see me either alone or with the patient in about 6 or 7 weeks. I do not believe this patient is violent or aggressive. His diagnosis is adjustment disorder with disturbance of conduct. I think there probably personality issues as well.The patient denies any neurological symptoms at this time. Physically he is very healthy. He exercises on a regular basis.

## 2017-09-27 ENCOUNTER — Ambulatory Visit: Payer: Medicare Other | Admitting: Psychology

## 2017-09-27 DIAGNOSIS — F4323 Adjustment disorder with mixed anxiety and depressed mood: Secondary | ICD-10-CM

## 2017-10-11 ENCOUNTER — Ambulatory Visit: Payer: Medicare Other | Admitting: Psychology

## 2017-10-11 DIAGNOSIS — F4323 Adjustment disorder with mixed anxiety and depressed mood: Secondary | ICD-10-CM

## 2017-10-25 ENCOUNTER — Ambulatory Visit (HOSPITAL_COMMUNITY): Payer: Self-pay | Admitting: Psychiatry

## 2017-10-25 ENCOUNTER — Ambulatory Visit: Payer: Medicare Other | Admitting: Psychology

## 2017-10-25 DIAGNOSIS — F4323 Adjustment disorder with mixed anxiety and depressed mood: Secondary | ICD-10-CM

## 2017-10-30 ENCOUNTER — Ambulatory Visit: Payer: Medicare Other | Admitting: Family Medicine

## 2017-10-30 ENCOUNTER — Encounter: Payer: Self-pay | Admitting: Family Medicine

## 2017-10-30 VITALS — BP 136/60 | HR 61 | Temp 97.4°F | Wt 179.3 lb

## 2017-10-30 DIAGNOSIS — H6122 Impacted cerumen, left ear: Secondary | ICD-10-CM

## 2017-10-30 NOTE — Progress Notes (Signed)
   Subjective:    Patient ID: Kenneth Little, male    DOB: [birth year], 72 y.o.   MRN: [id number]  HPI Here to check his right ear. He had been noticing some garbled sounds in the right ear for a week. A nurse from his insurance company saw him last week and she found wax in the ear. He tried OTC debrox drops but they did not help.    Review of Systems  Constitutional: Negative.   HENT: Positive for hearing loss. Negative for ear discharge, ear pain, sinus pressure and sinus pain.   Eyes: Negative.   Respiratory: Negative.        Objective:   Physical Exam  Constitutional: He appears well-developed and well-nourished.  HENT:  Left Ear: External ear normal.  Nose: Nose normal.  Mouth/Throat: Oropharynx is clear and moist.  The right ear canal is full of cerumen  Cardiovascular: Normal rate, regular rhythm, normal heart sounds and intact distal pulses.  Pulmonary/Chest: Effort normal and breath sounds normal.          Assessment & Plan:  Cerumen impaction. This was irrigated clear with water.  Alysia Penna, MD

## 2017-10-31 ENCOUNTER — Ambulatory Visit (INDEPENDENT_AMBULATORY_CARE_PROVIDER_SITE_OTHER): Payer: Medicare Other

## 2017-10-31 DIAGNOSIS — Z23 Encounter for immunization: Secondary | ICD-10-CM | POA: Diagnosis not present

## 2017-10-31 DIAGNOSIS — Z0000 Encounter for general adult medical examination without abnormal findings: Secondary | ICD-10-CM | POA: Diagnosis not present

## 2017-10-31 NOTE — Progress Notes (Addendum)
Subjective:   Kenneth Little is a 72 y.o. male who presents for Medicare Annual/Subsequent preventive examination.  Reports health as good Just in for for ear wax and came by here to get ear washed out Type 2 DM doing well  Came here for post graduate work Worked as a Airline pilot business as Scientist, research (physical sciences) x 40 years   Wife is getting worse Stays with son some now    Has one son lives in [place]; works in the [employer] -   Diet  Chol/hdl 2 A1c 08/2015  At 7 Sees Dr. Elyse Hsu  Eats less rice; eats more veggies, meat and fruit  Breakfast; 2 slices of bread peanut butter Smoked salmon Tomato Fish, steak; chinese  Lots of vegetables  Healthy diet and better than yesterday   [place] Mylar was his lunar Birthday   BMI 27   Exercise - group of older people play basketball  Has not played this year 2019 as his knees hurt Exercises very day at home x 20 minutes   4 weeks ago very bad x 2 days  Eyes; Dr. Claudean Kinds - just checked again  Diabetic eye exam - no DM retinopathy 9/4 2019 Call to Dr. Aletha Halim office to fax eye report today  Eye for a diabetic is healthy Has cataract but may wait until next year to have these removed   There are no preventive care reminders to display for this patient. IMM has his first shingrix 8/27  Understands to take the 2nd within 6 months  Flu vaccine taken today   Family stress  Dr. Cheryln Manly is helping Going to the Alzheimer's work shops Given the name of Derrel Nip to assist with redirecting wife as needed     Objective:    Vitals: BP 124/60   Pulse 82   Ht 5\' 7"  (1.702 m)   Wt 177 lb 8 oz (80.5 kg)   SpO2 93%   BMI 27.80 kg/m   Body mass index is 27.8 kg/m.  Advanced Directives 10/27/2016 03/22/2016 01/30/2015 12/24/2012 12/23/2012 12/23/2012 03/29/2012  Does Patient Have a Medical Advance Directive? Yes Yes No Patient does not have advance directive Patient does not have advance directive  Patient does not have advance directive Patient does not have advance directive  Type of Advance Directive - [place];Living will - - - - -  Does patient want to make changes to medical advance directive? - No - Patient declined - - - - -  Copy of [place] in Chart? - No - copy requested - - - - -  Would patient like information on creating a medical advance directive? - - No - patient declined information - - - -  Pre-existing out of facility DNR order (yellow form or pink MOST form) - - - No - No No  Some encounter information is confidential and restricted. Go to Review Flowsheets activity to see all data.    Tobacco Social History   Tobacco Use  Smoking Status Former Smoker  . Types: Cigarettes  . Last attempt to quit: 11/20/1988  . Years since quitting: 28.9  Smokeless Tobacco Never Used     Counseling given: Yes   Clinical Intake:   Past Medical History:  Diagnosis Date  . Diabetes mellitus without complication ([place])   . Enlarged prostate   . Hyperlipidemia   . Hypertension   . UTI (lower urinary tract infection)    Past Surgical History:  Procedure  Laterality Date  . CHOLECYSTECTOMY N/A 12/24/2012   Procedure: LAPAROSCOPIC CHOLECYSTECTOMY WITH INTRAOPERATIVE CHOLANGIOGRAM;  Surgeon: Shann Medal, MD;  Location: [facility];  Service: General;  Laterality: N/A;  . CYSTOSCOPY WITH BIOPSY N/A 03/22/2016   Procedure: CYSTOSCOPY WITH BLADDER  BIOPSY WITH FULGURATION;  Surgeon: Irine Seal, MD;  Location: [facility];  Service: Urology;  Laterality: N/A;  . diverticulitis    . ERCP N/A 12/23/2012   Procedure: ENDOSCOPIC RETROGRADE CHOLANGIOPANCREATOGRAPHY (ERCP);  Surgeon: Jeryl Columbia, MD;  Location: [facility];  Service: Endoscopy;  Laterality: N/A;  . HEMORRHOID SURGERY  03/20/12   internal  . HERNIA REPAIR Bilateral    childhood   Family History  Problem Relation Age of Onset  . Cancer Mother        breast  .  Hypertension Father   . Diabetes Brother    Social History   Socioeconomic History  . Marital status: Married    Spouse name: Not on file  . Number of children: Not on file  . Years of education: Not on file  . Highest education level: Not on file  Occupational History  . Not on file  Social Needs  . Financial resource strain: Not on file  . Food insecurity:    Worry: Not on file    Inability: Not on file  . Transportation needs:    Medical: Not on file    Non-medical: Not on file  Tobacco Use  . Smoking status: Former Smoker    Types: Cigarettes    Last attempt to quit: 11/20/1988    Years since quitting: 28.9  . Smokeless tobacco: Never Used  Substance and Sexual Activity  . Alcohol use: No    Alcohol/week: 0.0 standard drinks  . Drug use: No  . Sexual activity: Yes    Partners: Female  Lifestyle  . Physical activity:    Days per week: Not on file    Minutes per session: Not on file  . Stress: Not on file  Relationships  . Social connections:    Talks on phone: Not on file    Gets together: Not on file    Attends religious service: Not on file    Active member of club or organization: Not on file    Attends meetings of clubs or organizations: Not on file    Relationship status: Not on file  Other Topics Concern  . Not on file  Social History Narrative  . Not on file    Outpatient Encounter Medications as of 10/31/2017  Medication Sig  . alfuzosin (UROXATRAL) 10 MG 24 hr tablet Take 10 mg by mouth every evening.  Marland Kitchen atorvastatin (LIPITOR) 40 MG tablet Take 1 tablet (40 mg total) by mouth every evening.  . calcium-vitamin D (OSCAL WITH D) 500-200 MG-UNIT per tablet Take 1 tablet by mouth every morning.  . divalproex (DEPAKOTE ER) 500 MG 24 hr tablet 2 qam  . insulin glargine (LANTUS) 100 UNIT/ML injection Inject 25 Units into the skin at bedtime.   . INVOKANA 300 MG TABS Take 1 tablet by mouth every morning.   . metFORMIN (GLUCOPHAGE) 1000 MG tablet Take  1,000 mg by mouth 2 (two) times daily with a meal.  . olmesartan (BENICAR) 40 MG tablet Take 1 tablet (40 mg total) by mouth daily.  Glory Rosebush VERIO test strip   . oxybutynin (DITROPAN-XL) 5 MG 24 hr tablet Take 1 tablet (5 mg total) by mouth at bedtime.  . pioglitazone (ACTOS)  45 MG tablet Take 1 tablet (45 mg total) by mouth every morning.  . sitaGLIPtin (JANUVIA) 100 MG tablet Take 1 tablet (100 mg total) by mouth every evening.  . TURMERIC PO Take by mouth.  . Vitamin D, Ergocalciferol, (DRISDOL) 50000 units CAPS capsule Take 50,000 Units by mouth every 7 (seven) days.   No facility-administered encounter medications on file as of 10/31/2017.     Activities of Daily Living No flowsheet data found.  Patient Care Team: Marletta Lor, MD as PCP - General (Internal Medicine) Juanita Craver, MD as Consulting Physician (Gastroenterology) Clarene Essex, MD as Consulting Physician (Gastroenterology) Juanito Doom, MD as Consulting Physician (Pulmonary Disease)   Assessment:   This is a routine wellness examination for [place].  Exercise Activities and Dietary recommendations    Goals    . patient     Just maintain your health Continue to take care of yourself     . Patient Stated     Take care of wife        Fall Risk Fall Risk  10/30/2017 10/27/2016 08/28/2015 06/27/2014 06/19/2013  Falls in the past year? No No No No No     Depression Screen PHQ 2/9 Scores 10/30/2017 10/27/2016 08/28/2015 06/27/2014  PHQ - 2 Score 0 0 0 0    Cognitive Function MMSE - Mini Mental State Exam 10/27/2016  Not completed: (No Data)   Ad8 score reviewed for issues:  Issues making decisions:  Less interest in hobbies / activities:  Repeats questions, stories (family complaining):  Trouble using ordinary gadgets (microwave, computer, phone):  Forgets the month or year:   Mismanaging finances:   Remembering appts:  Daily problems with thinking and/or memory: Ad8 score  is=          Immunization History  Administered Date(s) Administered  . Influenza, High Dose Seasonal PF 10/27/2016  . Influenza-Unspecified 11/20/2014  . Pneumococcal Conjugate-13 12/26/2013  . Pneumococcal Polysaccharide-23 12/23/2012  . Tetanus 06/05/2013  . Zoster Recombinat (Shingrix) 09/05/2017     Screening Tests Health Maintenance  Topic Date Due  . INFLUENZA VACCINE  12/08/2017 (Originally 08/10/2017)  . HEMOGLOBIN A1C  03/10/2018  . FOOT EXAM  09/06/2018  . OPHTHALMOLOGY EXAM  09/14/2018  . COLONOSCOPY  04/04/2023  . TETANUS/TDAP  06/06/2023  . Hepatitis C Screening  Completed  . PNA vac Low Risk Adult  Completed      Plan:      PCP Notes   Health Maintenance IMM has his first shingrix 8/27  Understands to take the 2nd within 6 months  Flu vaccine taken today   Colonoscopy due 03/2023    Abnormal Screens  A1c is improving; does not eat as much rice Working on diet   Referrals  none  Patient concerns; [place] stated the circulation in the left great toe was not as good. States his sensation is good; pulses are good   Nurse Concerns; As noted  Next PCP apt Apt with Dr. Jerilee Hoh 12/01/2017      I have personally reviewed and noted the following in the patient's chart:   . Medical and social history . Use of alcohol, tobacco or illicit drugs  . Current medications and supplements . Functional ability and status . Nutritional status . Physical activity . Advanced directives . List of other physicians . Hospitalizations, surgeries, and ER visits in previous 12 months . Vitals . Screenings to include cognitive, depression, and falls . Referrals and appointments  In addition, I have reviewed  and discussed with patient certain preventive protocols, quality metrics, and best practice recommendations. A written personalized care plan for preventive services as well as general preventive health recommendations were provided to patient.      MWUXL,KGMWN, RN  10/31/2017  I have reviewed the documentation for the [facility] and [place] provided by the health coach and agree with their documentation. I was immediately available for any questions  Eulas Post MD [facility] at [facility]

## 2017-10-31 NOTE — Patient Instructions (Addendum)
Kenneth Little , Thank you for taking time to come for your Medicare Wellness Visit. I appreciate your ongoing commitment to your health goals. Please review the following plan we discussed and let me know if I can assist you in the future.   Will take your 2nd dose of shingrix within 6 months of the first  Will take your flu vaccine today   You can look online for video's by Derrel Nip;   Men Ages 87 to 41 Years who Have Ever Smoked  The USPSTF recommends one-time screening for abdominal aortic aneurysm (AAA) with ultrasonography in men ages 67 to 78 years who have ever smoked.  You did have an abd CT which did not now an aneurysm in 2014    These are the goals we discussed: Goals    . patient     Just maintain your health Continue to take care of yourself        This is a list of the screening recommended for you and due dates:  Health Maintenance  Topic Date Due  . Flu Shot  12/08/2017*  . Hemoglobin A1C  03/10/2018  . Complete foot exam   09/06/2018  . Eye exam for diabetics  09/14/2018  . Colon Cancer Screening  04/04/2023  . Tetanus Vaccine  06/06/2023  .  Hepatitis C: One time screening is recommended by Center for Disease Control  (CDC) for  adults born from [birth year] through 1965.   Completed  . Pneumonia vaccines  Completed  *Topic was postponed. The date shown is not the original due date.      Fall Prevention in the Home Falls can cause injuries. They can happen to people of all ages. There are many things you can do to make your home safe and to help prevent falls. What can I do on the outside of my home?  Regularly fix the edges of walkways and driveways and fix any cracks.  Remove anything that might make you trip as you walk through a door, such as a raised step or threshold.  Trim any bushes or trees on the path to your home.  Use bright outdoor lighting.  Clear any walking paths of anything that might make someone trip, such as rocks or tools.  Regularly  check to see if handrails are loose or broken. Make sure that both sides of any steps have handrails.  Any raised decks and porches should have guardrails on the edges.  Have any leaves, snow, or ice cleared regularly.  Use sand or salt on walking paths during winter.  Clean up any spills in your garage right away. This includes oil or grease spills. What can I do in the bathroom?  Use night lights.  Install grab bars by the toilet and in the tub and shower. Do not use towel bars as grab bars.  Use non-skid mats or decals in the tub or shower.  If you need to sit down in the shower, use a plastic, non-slip stool.  Keep the floor dry. Clean up any water that spills on the floor as soon as it happens.  Remove soap buildup in the tub or shower regularly.  Attach bath mats securely with double-sided non-slip rug tape.  Do not have throw rugs and other things on the floor that can make you trip. What can I do in the bedroom?  Use night lights.  Make sure that you have a light by your bed that is easy to reach.  Do  not use any sheets or blankets that are too big for your bed. They should not hang down onto the floor.  Have a firm chair that has side arms. You can use this for support while you get dressed.  Do not have throw rugs and other things on the floor that can make you trip. What can I do in the kitchen?  Clean up any spills right away.  Avoid walking on wet floors.  Keep items that you use a lot in easy-to-reach places.  If you need to reach something above you, use a strong step stool that has a grab bar.  Keep electrical cords out of the way.  Do not use floor polish or wax that makes floors slippery. If you must use wax, use non-skid floor wax.  Do not have throw rugs and other things on the floor that can make you trip. What can I do with my stairs?  Do not leave any items on the stairs.  Make sure that there are handrails on both sides of the stairs and  use them. Fix handrails that are broken or loose. Make sure that handrails are as long as the stairways.  Check any carpeting to make sure that it is firmly attached to the stairs. Fix any carpet that is loose or worn.  Avoid having throw rugs at the top or bottom of the stairs. If you do have throw rugs, attach them to the floor with carpet tape.  Make sure that you have a light switch at the top of the stairs and the bottom of the stairs. If you do not have them, ask someone to add them for you. What else can I do to help prevent falls?  Wear shoes that: ? Do not have high heels. ? Have rubber bottoms. ? Are comfortable and fit you well. ? Are closed at the toe. Do not wear sandals.  If you use a stepladder: ? Make sure that it is fully opened. Do not climb a closed stepladder. ? Make sure that both sides of the stepladder are locked into place. ? Ask someone to hold it for you, if possible.  Clearly mark and make sure that you can see: ? Any grab bars or handrails. ? First and last steps. ? Where the edge of each step is.  Use tools that help you move around (mobility aids) if they are needed. These include: ? Canes. ? Walkers. ? Scooters. ? Crutches.  Turn on the lights when you go into a dark area. Replace any light bulbs as soon as they burn out.  Set up your furniture so you have a clear path. Avoid moving your furniture around.  If any of your floors are uneven, fix them.  If there are any pets around you, be aware of where they are.  Review your medicines with your doctor. Some medicines can make you feel dizzy. This can increase your chance of falling. Ask your doctor what other things that you can do to help prevent falls. This information is not intended to replace advice given to you by your health care provider. Make sure you discuss any questions you have with your health care provider. Document Released: 10/23/2008 Document Revised: 06/04/2015 Document  Reviewed: 01/31/2014 Elsevier Interactive Patient Education  2018 [place] Maintenance, Male A healthy lifestyle and preventive care is important for your health and wellness. Ask your health care provider about what schedule of regular examinations is right for you.  What should I know about weight and diet? Eat a Healthy Diet  Eat plenty of vegetables, fruits, whole grains, low-fat dairy products, and lean protein.  Do not eat a lot of foods high in solid fats, added sugars, or salt.  Maintain a Healthy Weight Regular exercise can help you achieve or maintain a healthy weight. You should:  Do at least 150 minutes of exercise each week. The exercise should increase your heart rate and make you sweat (moderate-intensity exercise).  Do strength-training exercises at least twice a week.  Watch Your Levels of Cholesterol and Blood Lipids  Have your blood tested for lipids and cholesterol every 5 years starting at 70 years of age. If you are at high risk for heart disease, you should start having your blood tested when you are 70 years old. You may need to have your cholesterol levels checked more often if: ? Your lipid or cholesterol levels are high. ? You are older than 70 years of age. ? You are at high risk for heart disease.  What should I know about cancer screening? Many types of cancers can be detected early and may often be prevented. Lung Cancer  You should be screened every year for lung cancer if: ? You are a current smoker who has smoked for at least 30 years. ? You are a former smoker who has quit within the past 15 years.  Talk to your health care provider about your screening options, when you should start screening, and how often you should be screened.  Colorectal Cancer  Routine colorectal cancer screening usually begins at 70 years of age and should be repeated every 5-10 years until you are 70 years old. You may need to be screened more often if  early forms of precancerous polyps or small growths are found. Your health care provider may recommend screening at an earlier age if you have risk factors for colon cancer.  Your health care provider may recommend using home test kits to check for hidden blood in the stool.  A small camera at the end of a tube can be used to examine your colon (sigmoidoscopy or colonoscopy). This checks for the earliest forms of colorectal cancer.  Prostate and Testicular Cancer  Depending on your age and overall health, your health care provider may do certain tests to screen for prostate and testicular cancer.  Talk to your health care provider about any symptoms or concerns you have about testicular or prostate cancer.  Skin Cancer  Check your skin from head to toe regularly.  Tell your health care provider about any new moles or changes in moles, especially if: ? There is a change in a mole's size, shape, or color. ? You have a mole that is larger than a pencil eraser.  Always use sunscreen. Apply sunscreen liberally and repeat throughout the day.  Protect yourself by wearing long sleeves, pants, a wide-brimmed hat, and sunglasses when outside.  What should I know about heart disease, diabetes, and high blood pressure?  If you are 58-54 years of age, have your blood pressure checked every 3-5 years. If you are 28 years of age or older, have your blood pressure checked every year. You should have your blood pressure measured twice-once when you are at a hospital or clinic, and once when you are not at a hospital or clinic. Record the average of the two measurements. To check your blood pressure when you are not at a hospital or clinic, you can  use: ? An automated blood pressure machine at a pharmacy. ? A home blood pressure monitor.  Talk to your health care provider about your target blood pressure.  If you are between 33-21 years old, ask your health care provider if you should take aspirin to  prevent heart disease.  Have regular diabetes screenings by checking your fasting blood sugar level. ? If you are at a normal weight and have a low risk for diabetes, have this test once every three years after the age of 65. ? If you are overweight and have a high risk for diabetes, consider being tested at a younger age or more often.  A one-time screening for abdominal aortic aneurysm (AAA) by ultrasound is recommended for men aged 77-75 years who are current or former smokers. What should I know about preventing infection? Hepatitis B If you have a higher risk for hepatitis B, you should be screened for this virus. Talk with your health care provider to find out if you are at risk for hepatitis B infection. Hepatitis C Blood testing is recommended for:  Everyone born from [birth year] through 1963.  Anyone with known risk factors for hepatitis C.  Sexually Transmitted Diseases (STDs)  You should be screened each year for STDs including gonorrhea and chlamydia if: ? You are sexually active and are younger than 70 years of age. ? You are older than 70 years of age and your health care provider tells you that you are at risk for this type of infection. ? Your sexual activity has changed since you were last screened and you are at an increased risk for chlamydia or gonorrhea. Ask your health care provider if you are at risk.  Talk with your health care provider about whether you are at high risk of being infected with HIV. Your health care provider may recommend a prescription medicine to help prevent HIV infection.  What else can I do?  Schedule regular health, dental, and eye exams.  Stay current with your vaccines (immunizations).  Do not use any tobacco products, such as cigarettes, chewing tobacco, and e-cigarettes. If you need help quitting, ask your health care provider.  Limit alcohol intake to no more than 2 drinks per day. One drink equals 12 ounces of beer, 5 ounces of wine, or  1 ounces of hard liquor.  Do not use street drugs.  Do not share needles.  Ask your health care provider for help if you need support or information about quitting drugs.  Tell your health care provider if you often feel depressed.  Tell your health care provider if you have ever been abused or do not feel safe at home. This information is not intended to replace advice given to you by your health care provider. Make sure you discuss any questions you have with your health care provider. Document Released: 06/25/2007 Document Revised: 08/26/2015 Document Reviewed: 09/30/2014 Elsevier Interactive Patient Education  [employer].

## 2017-11-08 ENCOUNTER — Ambulatory Visit (INDEPENDENT_AMBULATORY_CARE_PROVIDER_SITE_OTHER): Payer: Medicare Other | Admitting: Psychology

## 2017-11-08 DIAGNOSIS — F4323 Adjustment disorder with mixed anxiety and depressed mood: Secondary | ICD-10-CM

## 2017-11-22 ENCOUNTER — Ambulatory Visit: Payer: Medicare Other | Admitting: Psychology

## 2017-11-22 DIAGNOSIS — F4323 Adjustment disorder with mixed anxiety and depressed mood: Secondary | ICD-10-CM

## 2017-12-01 ENCOUNTER — Ambulatory Visit: Payer: Medicare Other | Admitting: Internal Medicine

## 2017-12-01 ENCOUNTER — Encounter: Payer: Self-pay | Admitting: Internal Medicine

## 2017-12-01 VITALS — BP 110/70 | HR 74 | Temp 97.9°F | Wt 175.4 lb

## 2017-12-01 DIAGNOSIS — E7800 Pure hypercholesterolemia, unspecified: Secondary | ICD-10-CM

## 2017-12-01 DIAGNOSIS — Z794 Long term (current) use of insulin: Secondary | ICD-10-CM

## 2017-12-01 DIAGNOSIS — I10 Essential (primary) hypertension: Secondary | ICD-10-CM

## 2017-12-01 DIAGNOSIS — E119 Type 2 diabetes mellitus without complications: Secondary | ICD-10-CM | POA: Diagnosis not present

## 2017-12-01 NOTE — Patient Instructions (Signed)
-  It was nice meeting you!  -I will see you back in August 2020 for your annual physical.  Please come fasting to that visit.  Of course you may see [place] sooner if you need to for any acute issues.

## 2017-12-01 NOTE — Progress Notes (Signed)
Established Patient Office Visit     CC/Reason for Visit: Establish care and chronic medical follow-up  HPI: Kenneth Little is a 72 y.o. male who is coming in today for the above mentioned reasons.  Due for annual preventive exam in August 2020.  Past Medical History is significant for: Hypertension, diabetes, hyperlipidemia.  His blood pressure has been stable for many years on the same medications.  His diabetes has been well controlled.  His most recent A1c was 6.63 months ago.  He gets this done through Dr. Darryl Nestle office who is his endocrinologist.  He is scheduled for repeat visit on 12/5.  He has no acute complaints today.  He remains active physically, plays basketball and soccer, has had to slow down a bit in the past year due to right knee pain.   Past Medical/Surgical History: Past Medical History:  Diagnosis Date  . Diabetes mellitus without complication ([place])   . Enlarged prostate   . Hyperlipidemia   . Hypertension   . UTI (lower urinary tract infection)     Past Surgical History:  Procedure Laterality Date  . CHOLECYSTECTOMY N/A 12/24/2012   Procedure: LAPAROSCOPIC CHOLECYSTECTOMY WITH INTRAOPERATIVE CHOLANGIOGRAM;  Surgeon: Shann Medal, MD;  Location: [facility];  Service: General;  Laterality: N/A;  . CYSTOSCOPY WITH BIOPSY N/A 03/22/2016   Procedure: CYSTOSCOPY WITH BLADDER  BIOPSY WITH FULGURATION;  Surgeon: Irine Seal, MD;  Location: [place];  Service: Urology;  Laterality: N/A;  . diverticulitis    . ERCP N/A 12/23/2012   Procedure: ENDOSCOPIC RETROGRADE CHOLANGIOPANCREATOGRAPHY (ERCP);  Surgeon: Jeryl Columbia, MD;  Location: [facility];  Service: Endoscopy;  Laterality: N/A;  . HEMORRHOID SURGERY  03/20/12   internal  . HERNIA REPAIR Bilateral    childhood    Social History:  reports that he quit smoking about 29 years ago. His smoking use included cigarettes. He has a 44.00 pack-year smoking history. He has never used smokeless tobacco. He  reports that he does not drink alcohol or use drugs.  Allergies: Allergies  Allergen Reactions  . Ace Inhibitors Cough and Itching  . Hydrochlorothiazide Cough and Itching    Family History:  Family History  Problem Relation Age of Onset  . Cancer Mother        breast  . Hypertension Father   . Diabetes Brother      Current Outpatient Medications:  .  alfuzosin (UROXATRAL) 10 MG 24 hr tablet, Take 10 mg by mouth every evening., Disp: , Rfl:  .  atorvastatin (LIPITOR) 40 MG tablet, Take 1 tablet (40 mg total) by mouth every evening., Disp: 90 tablet, Rfl: 5 .  calcium-vitamin D (OSCAL WITH D) 500-200 MG-UNIT per tablet, Take 1 tablet by mouth every morning., Disp: , Rfl:  .  divalproex (DEPAKOTE ER) 500 MG 24 hr tablet, 2 qam, Disp: 60 tablet, Rfl: 5 .  insulin glargine (LANTUS) 100 UNIT/ML injection, Inject 25 Units into the skin at bedtime. , Disp: , Rfl:  .  INVOKANA 300 MG TABS, Take 1 tablet by mouth every morning. , Disp: , Rfl:  .  metFORMIN (GLUCOPHAGE) 1000 MG tablet, Take 1,000 mg by mouth 2 (two) times daily with a meal., Disp: , Rfl:  .  olmesartan (BENICAR) 40 MG tablet, Take 1 tablet (40 mg total) by mouth daily., Disp: 90 tablet, Rfl: 4 .  ONETOUCH VERIO test strip, , Disp: , Rfl:  .  oxybutynin (DITROPAN-XL) 5 MG 24 hr tablet, Take  1 tablet (5 mg total) by mouth at bedtime., Disp: 60 tablet, Rfl: 4 .  pioglitazone (ACTOS) 45 MG tablet, Take 1 tablet (45 mg total) by mouth every morning., Disp: 90 tablet, Rfl: 5 .  sitaGLIPtin (JANUVIA) 100 MG tablet, Take 1 tablet (100 mg total) by mouth every evening., Disp: 90 tablet, Rfl: 5 .  TURMERIC PO, Take by mouth., Disp: , Rfl:  .  Vitamin D, Ergocalciferol, (DRISDOL) 50000 units CAPS capsule, Take 50,000 Units by mouth every 7 (seven) days., Disp: , Rfl:   Review of Systems:  Constitutional: Denies fever, chills, diaphoresis, appetite change and fatigue.  HEENT: Denies photophobia, eye pain, redness, hearing loss, ear  pain, congestion, sore throat, rhinorrhea, sneezing, mouth sores, trouble swallowing, neck pain, neck stiffness and tinnitus.   Respiratory: Denies SOB, DOE, cough, chest tightness,  and wheezing.   Cardiovascular: Denies chest pain, palpitations and leg swelling.  Gastrointestinal: Denies nausea, vomiting, abdominal pain, diarrhea, constipation, blood in stool and abdominal distention.  Musculoskeletal: Denies myalgias, back pain, joint swelling, arthralgias and gait problem.  Skin: Denies pallor, rash and wound.  Neurological: Denies dizziness, seizures, syncope, weakness, light-headedness, numbness and headaches.  Hematological: Denies adenopathy. Easy bruising, personal or family bleeding history  Psychiatric/Behavioral: Denies suicidal ideation, mood changes, confusion, nervousness, sleep disturbance and agitation    Physical Exam: Vitals:   12/01/17 1302  BP: 110/70  Pulse: 74  Temp: 97.9 F (36.6 C)  TempSrc: Oral  SpO2: 98%  Weight: 175 lb 6.4 oz (79.6 kg)    Body mass index is 27.47 kg/m.   Constitutional: NAD, calm, comfortable Eyes: PERRL, lids and conjunctivae normal ENMT: Mucous membranes are moist.  Neck: normal, supple, no masses, no thyromegaly Respiratory: clear to auscultation bilaterally, no wheezing, no crackles. Normal respiratory effort. No accessory muscle use.  Cardiovascular: Regular rate and rhythm, no murmurs / rubs / gallops. No extremity edema. 2+ pedal pulses. No carotid bruits.  Musculoskeletal: no clubbing / cyanosis. No joint deformity upper and lower extremities. Good ROM, no contractures. Normal muscle tone.  Psychiatric: Normal judgment and insight. Alert and oriented x 3. Normal mood.    Impression and Plan:  Type 2 diabetes mellitus without complication, with long-term current use of insulin (HCC)  Hypercholesterolemia  Essential hypertension  -He is doing well overall. -He prefers to continue his diabetic management through Dr.  Darryl Nestle office.  He remains on Lantus, Invokana and Actos. -His most recent A1c per his report was 6.6 in September, due for repeat visit in December. -His blood pressure is well controlled on current medications. -He does not need medication refills today.   Patient Instructions  -It was nice meeting you!  -I will see you back in August 2020 for your annual physical.  Please come fasting to that visit.  Of course you may see [place] sooner if you need to for any acute issues.     Lelon Frohlich, MD [facility]

## 2017-12-04 LAB — HEMOGLOBIN A1C: HEMOGLOBIN A1C: 6.4 — AB (ref 4.0–6.0)

## 2017-12-05 ENCOUNTER — Ambulatory Visit (INDEPENDENT_AMBULATORY_CARE_PROVIDER_SITE_OTHER): Payer: Medicare Other | Admitting: Psychology

## 2017-12-05 DIAGNOSIS — F4323 Adjustment disorder with mixed anxiety and depressed mood: Secondary | ICD-10-CM

## 2017-12-06 ENCOUNTER — Ambulatory Visit (HOSPITAL_COMMUNITY): Payer: Self-pay | Admitting: Psychiatry

## 2017-12-06 ENCOUNTER — Ambulatory Visit: Payer: Medicare Other | Admitting: Psychology

## 2017-12-20 ENCOUNTER — Encounter (HOSPITAL_COMMUNITY): Payer: Self-pay | Admitting: Psychiatry

## 2017-12-20 ENCOUNTER — Ambulatory Visit: Payer: Medicare Other | Admitting: Psychology

## 2017-12-20 ENCOUNTER — Encounter: Payer: Self-pay | Admitting: Internal Medicine

## 2017-12-20 ENCOUNTER — Ambulatory Visit (HOSPITAL_COMMUNITY): Payer: Medicare Other | Admitting: Psychiatry

## 2017-12-20 VITALS — BP 136/66 | HR 89 | Ht 66.5 in | Wt 175.0 lb

## 2017-12-20 DIAGNOSIS — F4323 Adjustment disorder with mixed anxiety and depressed mood: Secondary | ICD-10-CM | POA: Diagnosis not present

## 2017-12-20 MED ORDER — DIVALPROEX SODIUM ER 500 MG PO TB24: ORAL_TABLET | ORAL | 5 refills | Status: DC

## 2017-12-20 NOTE — Progress Notes (Signed)
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   Today the patient is at his baseline.  He continues to see Dr. Cheryln Little he continues to take Depakote.  Overall his mood is actually pretty good.  He is not depressed.  He is not anxious.  He is sleeping and eating well.  He does have a support group.  He plays mah-jongg regularly.  He no longer plays basketball.  At this time I have made contact with his son Kenneth Little.  Kenneth Little is taking care of his mother.  He is been doing it for about 5 to 6 weeks.  He is having a hard time taking care of her.  She is showing progressive dementia.  In my conversations with Kenneth Little he is interested in having his father see his mother.  He asked me if he should do it in a planned way or spontaneous approximately.  I recommended neither weight is good.  I think telling his mother that his father is good come to visit it might make her more agitated and anxious.  Apparently she does have some negative emotions towards her husband.  At this time Kenneth Little the daughter seems to be very resistant.  Once again it is evident that Kenneth Little has a just as much of a problem with her anger and her irritability as this patient does.  It should be noted the patient is not aggressive or agitated to anyone else.  In fact he really was never that physically violent with his wife.  At this time the patient is not suicidal nor she homicidal.  He will continue taking Depakote and continue going to support groups for dementia. PTSD Symptoms:   Past Psychiatric History:   Previous Psychotropic Medications: No   Substance Abuse History in the last 12 months:  No.  Consequences of Substance Abuse:   Past Medical History:  Past Medical History:  Diagnosis Date  . Diabetes mellitus  without complication ([place])   . Enlarged prostate   . Hyperlipidemia   . Hypertension   . UTI (lower urinary tract infection)     Past Surgical History:  Procedure Laterality Date  . CHOLECYSTECTOMY N/A 12/24/2012   Procedure: LAPAROSCOPIC CHOLECYSTECTOMY WITH INTRAOPERATIVE CHOLANGIOGRAM;  Surgeon: Kenneth Medal, MD;  [facility];  Service: General;  Laterality: N/A;  . CYSTOSCOPY WITH BIOPSY N/A 03/22/2016   Procedure: CYSTOSCOPY WITH BLADDER  BIOPSY WITH FULGURATION;  Surgeon: Kenneth Seal, MD;  Location: [place];  Service: Urology;  Laterality: N/A;  . diverticulitis    . ERCP N/A 12/23/2012   Procedure: ENDOSCOPIC RETROGRADE CHOLANGIOPANCREATOGRAPHY (ERCP);  Surgeon: Kenneth Columbia, MD;  [facility];  Service: Endoscopy;  Laterality: N/A;  . HEMORRHOID SURGERY  03/20/12   internal  . HERNIA REPAIR Bilateral    childhood    Family Psychiatric History:   Family History:  Family History  Problem Relation Age of Onset  . Cancer Mother        breast  . Hypertension Father   . Diabetes Brother     Social History:   Social History   Socioeconomic History  . Marital status: Married    Spouse name: Not on file  . Number of children: Not on file  . Years of education: Not on file  . Highest education level: Not  on file  Occupational History  . Not on file  Social Needs  . Financial resource strain: Not on file  . Food insecurity:    Worry: Not on file    Inability: Not on file  . Transportation needs:    Medical: Not on file    Non-medical: Not on file  Tobacco Use  . Smoking status: Former Smoker    Packs/day: 2.00    Years: 22.00    Pack years: 44.00    Types: Cigarettes    Last attempt to quit: 11/20/1988    Years since quitting: 29.1  . Smokeless tobacco: Never Used  . Tobacco comment: 2 packs winstons  Substance and Sexual Activity  . Alcohol use: No    Alcohol/week: 0.0 standard drinks  . Drug use: No  . Sexual activity: Yes     Partners: Female  Lifestyle  . Physical activity:    Days per week: Not on file    Minutes per session: Not on file  . Stress: Not on file  Relationships  . Social connections:    Talks on phone: Not on file    Gets together: Not on file    Attends religious service: Not on file    Active member of club or organization: Not on file    Attends meetings of clubs or organizations: Not on file    Relationship status: Not on file  Other Topics Concern  . Not on file  Social History Narrative  . Not on file    Additional Social History:   Allergies:   Allergies  Allergen Reactions  . Ace Inhibitors Cough and Itching  . Hydrochlorothiazide Cough and Itching    Metabolic Disorder Labs: Lab Results  Component Value Date   HGBA1C 6.4 (A) 12/04/2017   No results found for: PROLACTIN Lab Results  Component Value Date   CHOL 94 08/21/2015   TRIG 70.0 08/21/2015   HDL 42.60 08/21/2015   CHOLHDL 2 08/21/2015   VLDL 14.0 08/21/2015   LDLCALC 38 08/21/2015   LDLCALC 37 06/20/2014     Current Medications: Current Outpatient Medications  Medication Sig Dispense Refill  . alfuzosin (UROXATRAL) 10 MG 24 hr tablet Take 10 mg by mouth every evening.    Marland Kitchen atorvastatin (LIPITOR) 40 MG tablet Take 1 tablet (40 mg total) by mouth every evening. 90 tablet 5  . calcium-vitamin D (OSCAL WITH D) 500-200 MG-UNIT per tablet Take 1 tablet by mouth every morning.    . divalproex (DEPAKOTE ER) 500 MG 24 hr tablet 2 qam 60 tablet 5  . insulin glargine (LANTUS) 100 UNIT/ML injection Inject 25 Units into the skin at bedtime.     . INVOKANA 300 MG TABS Take 1 tablet by mouth every morning.     . metFORMIN (GLUCOPHAGE) 1000 MG tablet Take 1,000 mg by mouth 2 (two) times daily with a meal.    . olmesartan (BENICAR) 40 MG tablet Take 1 tablet (40 mg total) by mouth daily. 90 tablet 4  . ONETOUCH VERIO test strip     . oxybutynin (DITROPAN-XL) 5 MG 24 hr tablet Take 1 tablet (5 mg total) by mouth at  bedtime. 60 tablet 4  . pioglitazone (ACTOS) 45 MG tablet Take 1 tablet (45 mg total) by mouth every morning. 90 tablet 5  . sitaGLIPtin (JANUVIA) 100 MG tablet Take 1 tablet (100 mg total) by mouth every evening. 90 tablet 5  . TURMERIC PO Take by mouth.    . Vitamin  D, Ergocalciferol, (DRISDOL) 50000 units CAPS capsule Take 50,000 Units by mouth every 7 (seven) days.     No current facility-administered medications for this visit.     Neurologic: Headache: No Seizure: No Paresthesias:No  Musculoskeletal: Strength & Muscle Tone: within normal limits Gait & Station: normal Patient leans: N/A  Psychiatric Specialty Exam: ROS  Blood pressure 136/66, pulse 89, height 5' 6.5" (1.689 m), weight 175 lb (79.4 kg), SpO2 94 %.Body mass index is 27.82 kg/m.  General Appearance: Casual  Eye Contact:  Good  Speech:  Clear and Coherent  Volume:  Normal  Mood:  Euthymic  Affect:  Appropriate  Thought Process:  Coherent  Orientation:  NA  Thought Content:  Logical  Suicidal Thoughts:  No  Homicidal Thoughts:  No  Memory:  NA  Judgement:  Good  Insight:  Fair  Psychomotor Activity:  Normal  Concentration:    Recall:  Good  Fund of Knowledge:Fair  Language: Good  Akathisia:  No  Handed:  Right  AIMS (if indicated):    Assets:  Communication Skills  ADL's:  Intact  Cognition: WNL  Sleep:      Treatment Plan Summary: 12/11/20194:07 PM  At this time the patient will continue seeing Dr. Cheryln Little in therapy.  He will continue taking Depakote.  I will make an effort to call [name] one more time to assess for flexibility of allowing the patient to see his own wife.  The patient feels very lonely and disappointed that he is not living around or near his wife.  He also misses his grandchildren a great deal.  I do not think this patient is agitated.  This is a deep-seated issue within this family.  This patient she will return to see me in 3 months.

## 2017-12-28 ENCOUNTER — Ambulatory Visit (INDEPENDENT_AMBULATORY_CARE_PROVIDER_SITE_OTHER): Payer: Medicare Other | Admitting: Psychology

## 2017-12-28 DIAGNOSIS — F4323 Adjustment disorder with mixed anxiety and depressed mood: Secondary | ICD-10-CM | POA: Diagnosis not present

## 2018-01-17 ENCOUNTER — Ambulatory Visit: Payer: Medicare Other | Admitting: Psychology

## 2018-01-17 DIAGNOSIS — F4323 Adjustment disorder with mixed anxiety and depressed mood: Secondary | ICD-10-CM | POA: Diagnosis not present

## 2018-01-31 ENCOUNTER — Ambulatory Visit (INDEPENDENT_AMBULATORY_CARE_PROVIDER_SITE_OTHER): Payer: Medicare Other | Admitting: Psychology

## 2018-01-31 DIAGNOSIS — F4323 Adjustment disorder with mixed anxiety and depressed mood: Secondary | ICD-10-CM | POA: Diagnosis not present

## 2018-02-14 ENCOUNTER — Ambulatory Visit: Payer: Medicare Other | Admitting: Psychology

## 2018-02-14 DIAGNOSIS — F4323 Adjustment disorder with mixed anxiety and depressed mood: Secondary | ICD-10-CM

## 2018-02-28 ENCOUNTER — Ambulatory Visit: Payer: Medicare Other | Admitting: Psychology

## 2018-03-01 ENCOUNTER — Ambulatory Visit (INDEPENDENT_AMBULATORY_CARE_PROVIDER_SITE_OTHER): Payer: Medicare Other | Admitting: Psychology

## 2018-03-01 DIAGNOSIS — F4323 Adjustment disorder with mixed anxiety and depressed mood: Secondary | ICD-10-CM | POA: Diagnosis not present

## 2018-03-14 ENCOUNTER — Ambulatory Visit: Payer: Medicare Other | Admitting: Psychology

## 2018-03-14 DIAGNOSIS — F4323 Adjustment disorder with mixed anxiety and depressed mood: Secondary | ICD-10-CM | POA: Diagnosis not present

## 2018-03-21 ENCOUNTER — Ambulatory Visit (HOSPITAL_COMMUNITY): Payer: Medicare Other | Admitting: Psychiatry

## 2018-03-21 ENCOUNTER — Encounter (HOSPITAL_COMMUNITY): Payer: Self-pay | Admitting: Psychiatry

## 2018-03-21 ENCOUNTER — Other Ambulatory Visit: Payer: Self-pay

## 2018-03-21 VITALS — BP 128/70 | Ht 67.5 in | Wt 178.0 lb

## 2018-03-21 DIAGNOSIS — F4324 Adjustment disorder with disturbance of conduct: Secondary | ICD-10-CM | POA: Diagnosis not present

## 2018-03-21 MED ORDER — DIVALPROEX SODIUM ER 500 MG PO TB24: ORAL_TABLET | ORAL | 5 refills | Status: DC

## 2018-03-21 NOTE — Progress Notes (Signed)
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  Today I shared with him that I did have contact with his daughter [name].  She is very resistant to having allowing the patient have any contact with her mother his wife.  Interestingly the patient at this time is not with [name] but rather with [name] his son.  While he wants to avoid conflict.  It is possible that while he could allow his father contact with his mother in a brief supervised time..  It should be noted that the patient makes food every week for his wife.  He brings it over to her.  But he never gets to see her.  The patient is not depressed.  He is obviously frustrated.  Fortunately he has a fairly good support system.  All of his friends believe that it is [name] the need psychiatric care not him.  The patient is not agitated at all.  He continues in one-to-one therapy with Dr. Cheryln Manly.  He is sleeping and eating well.  He stays active.  He is obviously very frustrated.  I recommended that he speak to Dr. Cheryln Manly about the possibility of having a phone conference with [name].  The patient has an appointment with Dr. Cheryln Manly tomorrow.  I do not think this patient is dangerous or at risk.  He is not psychotic he drinks no alcohol and uses no drugs.  He is a very stable individual.     Past Psychiatric History:   Previous Psychotropic Medications: No   Substance Abuse History in the last 12 months:  No.  Consequences of Substance Abuse:   Past Medical History:  Past Medical History:  Diagnosis Date  . Diabetes mellitus without complication ([place])   . Enlarged prostate   . Hyperlipidemia   . Hypertension   . UTI (lower urinary tract infection)     Past Surgical History:  Procedure Laterality  Date  . CHOLECYSTECTOMY N/A 12/24/2012   Procedure: LAPAROSCOPIC CHOLECYSTECTOMY WITH INTRAOPERATIVE CHOLANGIOGRAM;  Surgeon: Shann Medal, MD;  [facility];  Service: General;  Laterality: N/A;  . CYSTOSCOPY WITH BIOPSY N/A 03/22/2016   Procedure: CYSTOSCOPY WITH BLADDER  BIOPSY WITH FULGURATION;  Surgeon: Irine Seal, MD;  Location: [place];  Service: Urology;  Laterality: N/A;  . diverticulitis    . ERCP N/A 12/23/2012   Procedure: ENDOSCOPIC RETROGRADE CHOLANGIOPANCREATOGRAPHY (ERCP);  Surgeon: Jeryl Columbia, MD;  [facility];  Service: Endoscopy;  Laterality: N/A;  . HEMORRHOID SURGERY  03/20/12   internal  . HERNIA REPAIR Bilateral    childhood    Family Psychiatric History:   Family History:  Family History  Problem Relation Age of Onset  . Cancer Mother        breast  . Hypertension Father   . Diabetes Brother     Social History:   Social History   Socioeconomic History  . Marital status: Married    Spouse name: Not on file  . Number of children: Not on file  . Years of education: Not on file  . Highest education level: Not on file  Occupational History  . Not on file  Social Needs  . Financial resource strain: Not on file  . Food insecurity:    Worry: Not on file  Inability: Not on file  . Transportation needs:    Medical: Not on file    Non-medical: Not on file  Tobacco Use  . Smoking status: Former Smoker    Packs/day: 2.00    Years: 22.00    Pack years: 44.00    Types: Cigarettes    Last attempt to quit: 11/20/1988    Years since quitting: 29.3  . Smokeless tobacco: Never Used  . Tobacco comment: 2 packs [employer]ons  Substance and Sexual Activity  . Alcohol use: No    Alcohol/week: 0.0 standard drinks  . Drug use: No  . Sexual activity: Yes    Partners: Female  Lifestyle  . Physical activity:    Days per week: Not on file    Minutes per session: Not on file  . Stress: Not on file  Relationships  . Social  connections:    Talks on phone: Not on file    Gets together: Not on file    Attends religious service: Not on file    Active member of club or organization: Not on file    Attends meetings of clubs or organizations: Not on file    Relationship status: Not on file  Other Topics Concern  . Not on file  Social History Narrative  . Not on file    Additional Social History:   Allergies:   Allergies  Allergen Reactions  . Ace Inhibitors Cough and Itching  . Hydrochlorothiazide Cough and Itching    Metabolic Disorder Labs: Lab Results  Component Value Date   HGBA1C 6.4 (A) 12/04/2017   No results found for: PROLACTIN Lab Results  Component Value Date   CHOL 94 08/21/2015   TRIG 70.0 08/21/2015   HDL 42.60 08/21/2015   CHOLHDL 2 08/21/2015   VLDL 14.0 08/21/2015   LDLCALC 38 08/21/2015   LDLCALC 37 06/20/2014     Current Medications: Current Outpatient Medications  Medication Sig Dispense Refill  . alfuzosin (UROXATRAL) 10 MG 24 hr tablet Take 10 mg by mouth every evening.    Marland Kitchen atorvastatin (LIPITOR) 40 MG tablet Take 1 tablet (40 mg total) by mouth every evening. 90 tablet 5  . calcium-vitamin D (OSCAL WITH D) 500-200 MG-UNIT per tablet Take 1 tablet by mouth every morning.    . divalproex (DEPAKOTE ER) 500 MG 24 hr tablet 2 qam 60 tablet 5  . insulin glargine (LANTUS) 100 UNIT/ML injection Inject 25 Units into the skin at bedtime.     . INVOKANA 300 MG TABS Take 1 tablet by mouth every morning.     . metFORMIN (GLUCOPHAGE) 1000 MG tablet Take 1,000 mg by mouth 2 (two) times daily with a meal.    . olmesartan (BENICAR) 40 MG tablet Take 1 tablet (40 mg total) by mouth daily. 90 tablet 4  . ONETOUCH VERIO test strip     . oxybutynin (DITROPAN-XL) 5 MG 24 hr tablet Take 1 tablet (5 mg total) by mouth at bedtime. 60 tablet 4  . pioglitazone (ACTOS) 45 MG tablet Take 1 tablet (45 mg total) by mouth every morning. 90 tablet 5  . sitaGLIPtin (JANUVIA) 100 MG tablet Take 1  tablet (100 mg total) by mouth every evening. 90 tablet 5  . TURMERIC PO Take by mouth.    . Vitamin D, Ergocalciferol, (DRISDOL) 50000 units CAPS capsule Take 50,000 Units by mouth every 7 (seven) days.     No current facility-administered medications for this visit.     Neurologic: Headache: No Seizure:  No Paresthesias:No  Musculoskeletal: Strength & Muscle Tone: within normal limits Gait & Station: normal Patient leans: N/A  Psychiatric Specialty Exam: ROS  Blood pressure 128/70, height 5' 7.5" (1.715 m), weight 178 lb (80.7 kg).Body mass index is 27.47 kg/m.  General Appearance: Casual  Eye Contact:  Good  Speech:  Clear and Coherent  Volume:  Normal  Mood:  Euthymic  Affect:  Appropriate  Thought Process:  Coherent  Orientation:  NA  Thought Content:  Logical  Suicidal Thoughts:  No  Homicidal Thoughts:  No  Memory:  NA  Judgement:  Good  Insight:  Fair  Psychomotor Activity:  Normal  Concentration:    Recall:  Good  Fund of Knowledge:Fair  Language: Good  Akathisia:  No  Handed:  Right  AIMS (if indicated):    Assets:  Communication Skills  ADL's:  Intact  Cognition: WNL  Sleep:      Treatment Plan Summary: 3/11/20203:07 PM  Today the patient has 1 problem and 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  The reality is that he is not showing any disturbances at all at this time.  He is trying to negotiate an opportunity to see his wife.  He will do it with supervision from his son or his daughter or anyone else.  He simply would like to start off by seeing her.  Ideally he like to get back into a relationship with his grandchildren but I do not think [name]'s daughter is going to allow this.  I think this is got to do with family dynamics.  The patient will continue taking the Depakote that I prescribed and he will return to see me in 3 months.  He will speak to Dr. Cheryln Manly about the possibility of talking to [facility] during the session ideally.

## 2018-03-28 ENCOUNTER — Ambulatory Visit: Payer: Medicare Other | Admitting: Psychology

## 2018-03-28 DIAGNOSIS — F4323 Adjustment disorder with mixed anxiety and depressed mood: Secondary | ICD-10-CM | POA: Diagnosis not present

## 2018-04-11 ENCOUNTER — Ambulatory Visit: Payer: Medicare Other | Admitting: Psychology

## 2018-04-25 ENCOUNTER — Ambulatory Visit: Payer: Medicare Other | Admitting: Psychology

## 2018-05-09 ENCOUNTER — Ambulatory Visit: Payer: Medicare Other | Admitting: Psychology

## 2018-05-23 ENCOUNTER — Ambulatory Visit: Payer: Medicare Other | Admitting: Psychology

## 2018-06-06 ENCOUNTER — Ambulatory Visit: Payer: Medicare Other | Admitting: Psychology

## 2018-06-20 ENCOUNTER — Ambulatory Visit: Payer: Medicare Other | Admitting: Psychology

## 2018-06-21 ENCOUNTER — Ambulatory Visit (HOSPITAL_COMMUNITY): Payer: Medicare Other | Admitting: Psychiatry

## 2018-07-04 ENCOUNTER — Ambulatory Visit: Payer: Medicare Other | Admitting: Psychology

## 2018-07-18 ENCOUNTER — Ambulatory Visit: Payer: Medicare Other | Admitting: Psychology

## 2018-08-01 ENCOUNTER — Ambulatory Visit: Payer: Medicare Other | Admitting: Psychology

## 2018-08-08 ENCOUNTER — Encounter (HOSPITAL_COMMUNITY): Payer: Self-pay | Admitting: Psychiatry

## 2018-08-08 ENCOUNTER — Ambulatory Visit (INDEPENDENT_AMBULATORY_CARE_PROVIDER_SITE_OTHER): Payer: Medicare Other | Admitting: Psychiatry

## 2018-08-08 ENCOUNTER — Other Ambulatory Visit: Payer: Self-pay

## 2018-08-08 DIAGNOSIS — F4323 Adjustment disorder with mixed anxiety and depressed mood: Secondary | ICD-10-CM

## 2018-08-08 MED ORDER — DIVALPROEX SODIUM ER 500 MG PO TB24: ORAL_TABLET | ORAL | 5 refills | Status: DC

## 2018-08-08 NOTE — Progress Notes (Signed)
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  He has some phone call contact with his wife.  I spoke for about 20 minutes.  Apparently [name] the daughter knew her mother was talking to the father.  Unfortunately according the patient his wife has a very short memory.  She probably does not remember that she spoke to him.  Somehow the patient seems to be somewhat better.  He is leading to talk to his wife and hear her voice.  Patient still makes meals for his wife and [name]'s family.  Patient is regular contact with his son [name].  While he seems to be doing fairly well.  The patient says she is sleeping and eating well.  He is very protective about avoiding any contact with the environment.  He has a lot of fears about the virus.  The patient spends a lot of time by himself in fact most the time.  He does do a lot of gardening and yard work.  He also exercises a lot.  He no longer spends much time with anybody.  He takes his Depakote as prescribed.  He denies the use of alcohol or drugs.  He denies chest pain shortness of breath or any physical complaints.  He really is quite physically stable.  Emotionally he seems to be tolerating things fairly well. +    Past Psychiatric History:   Previous Psychotropic Medications: No   Substance Abuse History in the last 12 months:  No.  Consequences of Substance Abuse:   Past Medical History:  Past Medical History:  Diagnosis Date  . Diabetes mellitus without complication ([place])   . Enlarged prostate   . Hyperlipidemia   . Hypertension   . UTI (lower urinary tract infection)     Past Surgical History:  Procedure Laterality Date  . CHOLECYSTECTOMY N/A 12/24/2012   Procedure: LAPAROSCOPIC  CHOLECYSTECTOMY WITH INTRAOPERATIVE CHOLANGIOGRAM;  Surgeon: Shann Medal, MD;  [facility]: [facility];  Service: General;  Laterality: N/A;  . CYSTOSCOPY WITH BIOPSY N/A 03/22/2016   Procedure: CYSTOSCOPY WITH BLADDER  BIOPSY WITH FULGURATION;  Surgeon: Irine Seal, MD;  Location: [facility];  Service: Urology;  Laterality: N/A;  . diverticulitis    . ERCP N/A 12/23/2012   Procedure: ENDOSCOPIC RETROGRADE CHOLANGIOPANCREATOGRAPHY (ERCP);  Surgeon: Jeryl Columbia, MD;  Location: [facility];  Service: Endoscopy;  Laterality: N/A;  . HEMORRHOID SURGERY  03/20/12   internal  . HERNIA REPAIR Bilateral    childhood    Family Psychiatric History:   Family History:  Family History  Problem Relation Age of Onset  . Cancer Mother        breast  . Hypertension Father   . Diabetes Brother     Social History:   Social History   Socioeconomic History  . Marital status: Married    Spouse name: Not on file  . Number of children: Not on file  . Years of education: Not on file  . Highest education level: Not on file  Occupational History  . Not on file  Social Needs  . Financial resource strain: Not on file  . Food insecurity    Worry: Not on file    Inability: Not on file  . Transportation needs    Medical: Not  on file    Non-medical: Not on file  Tobacco Use  . Smoking status: Former Smoker    Packs/day: 2.00    Years: 22.00    Pack years: 44.00    Types: Cigarettes    Quit date: 11/20/1988    Years since quitting: 29.7  . Smokeless tobacco: Never Used  . Tobacco comment: 2 packs winstons  Substance and Sexual Activity  . Alcohol use: No    Alcohol/week: 0.0 standard drinks  . Drug use: No  . Sexual activity: Yes    Partners: Female  Lifestyle  . Physical activity    Days per week: Not on file    Minutes per session: Not on file  . Stress: Not on file  Relationships  . Social Herbalist on phone: Not on file    Gets together: Not on file    Attends  religious service: Not on file    Active member of club or organization: Not on file    Attends meetings of clubs or organizations: Not on file    Relationship status: Not on file  Other Topics Concern  . Not on file  Social History Narrative  . Not on file    Additional Social History:   Allergies:   Allergies  Allergen Reactions  . Ace Inhibitors Cough and Itching  . Hydrochlorothiazide Cough and Itching    Metabolic Disorder Labs: Lab Results  Component Value Date   HGBA1C 6.4 (A) 12/04/2017   No results found for: PROLACTIN Lab Results  Component Value Date   CHOL 94 08/21/2015   TRIG 70.0 08/21/2015   HDL 42.60 08/21/2015   CHOLHDL 2 08/21/2015   VLDL 14.0 08/21/2015   LDLCALC 38 08/21/2015   LDLCALC 37 06/20/2014     Current Medications: Current Outpatient Medications  Medication Sig Dispense Refill  . alfuzosin (UROXATRAL) 10 MG 24 hr tablet Take 10 mg by mouth every evening.    Marland Kitchen atorvastatin (LIPITOR) 40 MG tablet Take 1 tablet (40 mg total) by mouth every evening. 90 tablet 5  . calcium-vitamin D (OSCAL WITH D) 500-200 MG-UNIT per tablet Take 1 tablet by mouth every morning.    . divalproex (DEPAKOTE ER) 500 MG 24 hr tablet 2 qam 60 tablet 5  . insulin glargine (LANTUS) 100 UNIT/ML injection Inject 25 Units into the skin at bedtime.     . INVOKANA 300 MG TABS Take 1 tablet by mouth every morning.     . metFORMIN (GLUCOPHAGE) 1000 MG tablet Take 1,000 mg by mouth 2 (two) times daily with a meal.    . olmesartan (BENICAR) 40 MG tablet Take 1 tablet (40 mg total) by mouth daily. 90 tablet 4  . ONETOUCH VERIO test strip     . oxybutynin (DITROPAN-XL) 5 MG 24 hr tablet Take 1 tablet (5 mg total) by mouth at bedtime. 60 tablet 4  . pioglitazone (ACTOS) 45 MG tablet Take 1 tablet (45 mg total) by mouth every morning. 90 tablet 5  . sitaGLIPtin (JANUVIA) 100 MG tablet Take 1 tablet (100 mg total) by mouth every evening. 90 tablet 5  . TURMERIC PO Take by mouth.     . Vitamin D, Ergocalciferol, (DRISDOL) 50000 units CAPS capsule Take 50,000 Units by mouth every 7 (seven) days.     No current facility-administered medications for this visit.     Neurologic: Headache: No Seizure: No Paresthesias:No  Musculoskeletal: Strength & Muscle Tone: within normal limits Gait & Station: normal  Patient leans: N/A  Psychiatric Specialty Exam: ROS  There were no vitals taken for this visit.There is no height or weight on file to calculate BMI.  General Appearance: Casual  Eye Contact:  Good  Speech:  Clear and Coherent  Volume:  Normal  Mood:  Euthymic  Affect:  Appropriate  Thought Process:  Coherent  Orientation:  NA  Thought Content:  Logical  Suicidal Thoughts:  No  Homicidal Thoughts:  No  Memory:  NA  Judgement:  Good  Insight:  Fair  Psychomotor Activity:  Normal  Concentration:    Recall:  Good  Fund of Knowledge:Fair  Language: Good  Akathisia:  No  Handed:  Right  AIMS (if indicated):    Assets:  Communication Skills  ADL's:  Intact  Cognition: WNL  Sleep:      Treatment Plan Summary: 7/29/20203:59 PM   This patient's first problem is an adjustment disorder with a disturbance in conduct.  The reality is the patient has not had a problem with behavior at all.  He takes Depakote which seems to reduce his irritability and his mood shifting.  The patient takes his Depakote as ordered.  He continues to try to be in therapy although his therapist is not quite available for him at this time.  The patient seems calm and comfortable.  He is functioning actually very well.  We will make contact again in 4 months.  He certainly is not suicidal nor is he homicidal.

## 2018-08-15 ENCOUNTER — Ambulatory Visit: Payer: Medicare Other | Admitting: Psychology

## 2018-08-29 ENCOUNTER — Ambulatory Visit (INDEPENDENT_AMBULATORY_CARE_PROVIDER_SITE_OTHER): Payer: Medicare Other | Admitting: Psychology

## 2018-08-29 ENCOUNTER — Ambulatory Visit: Payer: Medicare Other | Admitting: Psychology

## 2018-08-29 DIAGNOSIS — F4323 Adjustment disorder with mixed anxiety and depressed mood: Secondary | ICD-10-CM

## 2018-09-12 ENCOUNTER — Ambulatory Visit: Payer: Medicare Other | Admitting: Psychology

## 2018-09-14 LAB — HM DIABETES EYE EXAM

## 2018-09-26 ENCOUNTER — Ambulatory Visit: Payer: Medicare Other | Admitting: Psychology

## 2018-10-08 LAB — HM DIABETES EYE EXAM

## 2018-10-10 ENCOUNTER — Ambulatory Visit: Payer: Medicare Other | Admitting: Psychology

## 2018-10-24 ENCOUNTER — Ambulatory Visit: Payer: Medicare Other | Admitting: Psychology

## 2018-11-07 ENCOUNTER — Ambulatory Visit: Payer: Medicare Other | Admitting: Psychology

## 2018-11-21 ENCOUNTER — Ambulatory Visit: Payer: Medicare Other | Admitting: Psychology

## 2018-11-28 ENCOUNTER — Ambulatory Visit (INDEPENDENT_AMBULATORY_CARE_PROVIDER_SITE_OTHER): Payer: Medicare Other | Admitting: Psychiatry

## 2018-11-28 ENCOUNTER — Other Ambulatory Visit: Payer: Self-pay

## 2018-11-28 DIAGNOSIS — F4323 Adjustment disorder with mixed anxiety and depressed mood: Secondary | ICD-10-CM | POA: Diagnosis not present

## 2018-11-28 MED ORDER — DIVALPROEX SODIUM ER 500 MG PO TB24: ORAL_TABLET | ORAL | 5 refills | Status: DC

## 2018-11-28 NOTE — Progress Notes (Signed)
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  He is amazingly calm and comfortable.  He does not have periods of explosions at all.  He takes his Depakote just as prescribed.  The patient is trying to help another couple an elderly couple of which one of them is dying.  The patient tries to do for other people.  He has had some contact with his wife who is called him few months ago.  She does not remember when she calls.  His daughter still makes no contact with him with the other hand his son [name] has regular contact.  The patient continues to clock for while he had for other family members.  He seems in some ways very dedicated to his family.  And he clearly describes being underappreciated.  For long periods of time he worked very hard to be involved in the care of his grandchildren.  Seems that his efforts are now forgotten.  Amazingly the patient remains positive.  He continues to function very well.  He continues to cook continues to do everything he needs to do.  He is amazingly fit.  He exercises 30 minutes in the morning and at night.  Medically he is very stable.  He denies chest pain shortness of breath or any evidence of a viral infection.    Past Psychiatric History:   Previous Psychotropic Medications: No   Substance Abuse History in the last 12 months:  No.  Consequences of Substance Abuse:   Past Medical History:  Past Medical History:  Diagnosis Date  . Diabetes mellitus without complication ([place])   . Enlarged prostate   . Hyperlipidemia   . Hypertension   . UTI (lower urinary tract infection)     Past Surgical History:  Procedure Laterality Date  . CHOLECYSTECTOMY N/A 12/24/2012   Procedure: LAPAROSCOPIC CHOLECYSTECTOMY WITH  INTRAOPERATIVE CHOLANGIOGRAM;  Surgeon: Shann Medal, MD;  [facility];  Service: General;  Laterality: N/A;  . CYSTOSCOPY WITH BIOPSY N/A 03/22/2016   Procedure: CYSTOSCOPY WITH BLADDER  BIOPSY WITH FULGURATION;  Surgeon: Irine Seal, MD;  Location: [facility];  Service: Urology;  Laterality: N/A;  . diverticulitis    . ERCP N/A 12/23/2012   Procedure: ENDOSCOPIC RETROGRADE CHOLANGIOPANCREATOGRAPHY (ERCP);  Surgeon: Jeryl Columbia, MD;  [facility];  Service: Endoscopy;  Laterality: N/A;  . HEMORRHOID SURGERY  03/20/12   internal  . HERNIA REPAIR Bilateral    childhood    Family Psychiatric History:   Family History:  Family History  Problem Relation Age of Onset  . Cancer Mother        breast  . Hypertension Father   . Diabetes Brother     Social History:   Social History   Socioeconomic History  . Marital status: Married    Spouse name: Not on file  . Number of children: Not on file  . Years of education: Not on file  . Highest education level: Not on file  Occupational History  . Not on file  Social Needs  . Financial resource strain: Not on file  . Food insecurity    Worry: Not on file    Inability: Not on file  . Transportation needs    Medical: Not on file  Non-medical: Not on file  Tobacco Use  . Smoking status: Former Smoker    Packs/day: 2.00    Years: 22.00    Pack years: 44.00    Types: Cigarettes    Quit date: 11/20/1988    Years since quitting: 30.0  . Smokeless tobacco: Never Used  . Tobacco comment: 2 packs winstons  Substance and Sexual Activity  . Alcohol use: No    Alcohol/week: 0.0 standard drinks  . Drug use: No  . Sexual activity: Yes    Partners: Female  Lifestyle  . Physical activity    Days per week: Not on file    Minutes per session: Not on file  . Stress: Not on file  Relationships  . Social Herbalist on phone: Not on file    Gets together: Not on file    Attends religious service: Not  on file    Active member of club or organization: Not on file    Attends meetings of clubs or organizations: Not on file    Relationship status: Not on file  Other Topics Concern  . Not on file  Social History Narrative  . Not on file    Additional Social History:   Allergies:   Allergies  Allergen Reactions  . Ace Inhibitors Cough and Itching  . Hydrochlorothiazide Cough and Itching    Metabolic Disorder Labs: Lab Results  Component Value Date   HGBA1C 6.4 (A) 12/04/2017   No results found for: PROLACTIN Lab Results  Component Value Date   CHOL 94 08/21/2015   TRIG 70.0 08/21/2015   HDL 42.60 08/21/2015   CHOLHDL 2 08/21/2015   VLDL 14.0 08/21/2015   LDLCALC 38 08/21/2015   LDLCALC 37 06/20/2014     Current Medications: Current Outpatient Medications  Medication Sig Dispense Refill  . alfuzosin (UROXATRAL) 10 MG 24 hr tablet Take 10 mg by mouth every evening.    Marland Kitchen atorvastatin (LIPITOR) 40 MG tablet Take 1 tablet (40 mg total) by mouth every evening. 90 tablet 5  . calcium-vitamin D (OSCAL WITH D) 500-200 MG-UNIT per tablet Take 1 tablet by mouth every morning.    . divalproex (DEPAKOTE ER) 500 MG 24 hr tablet 2 qam 60 tablet 5  . insulin glargine (LANTUS) 100 UNIT/ML injection Inject 25 Units into the skin at bedtime.     . INVOKANA 300 MG TABS Take 1 tablet by mouth every morning.     . metFORMIN (GLUCOPHAGE) 1000 MG tablet Take 1,000 mg by mouth 2 (two) times daily with a meal.    . olmesartan (BENICAR) 40 MG tablet Take 1 tablet (40 mg total) by mouth daily. 90 tablet 4  . ONETOUCH VERIO test strip     . oxybutynin (DITROPAN-XL) 5 MG 24 hr tablet Take 1 tablet (5 mg total) by mouth at bedtime. 60 tablet 4  . pioglitazone (ACTOS) 45 MG tablet Take 1 tablet (45 mg total) by mouth every morning. 90 tablet 5  . sitaGLIPtin (JANUVIA) 100 MG tablet Take 1 tablet (100 mg total) by mouth every evening. 90 tablet 5  . TURMERIC PO Take by mouth.    . Vitamin D,  Ergocalciferol, (DRISDOL) 50000 units CAPS capsule Take 50,000 Units by mouth every 7 (seven) days.     No current facility-administered medications for this visit.     Neurologic: Headache: No Seizure: No Paresthesias:No  Musculoskeletal: Strength & Muscle Tone: within normal limits Gait & Station: normal Patient leans: N/A  Psychiatric  Specialty Exam: ROS  There were no vitals taken for this visit.There is no height or weight on file to calculate BMI.  General Appearance: Casual  Eye Contact:  Good  Speech:  Clear and Coherent  Volume:  Normal  Mood:  Euthymic  Affect:  Appropriate  Thought Process:  Coherent  Orientation:  NA  Thought Content:  Logical  Suicidal Thoughts:  No  Homicidal Thoughts:  No  Memory:  NA  Judgement:  Good  Insight:  Fair  Psychomotor Activity:  Normal  Concentration:    Recall:  Good  Fund of Knowledge:Fair  Language: Good  Akathisia:  No  Handed:  Right  AIMS (if indicated):    Assets:  Communication Skills  ADL's:  Intact  Cognition: WNL  Sleep:      Treatment Plan Summary: 11/18/20203:34 PM  This patient's first problem is an adjustment disorder with a disturbance of conduct.  I believe the Depakote has some benefits in making his mood more even and stable.  Medically the patient is very stable.  He is functioning very well.  He continues seeing his therapist Dr. Cheryln Manly on a regular basis.  This patient makes no aggressive efforts to make contact with his wife.  He is very compliant and demonstrates significant patients.  He will continue to see me continue taking Depakote.  Will be reevaluated in 5 months where hopefully he can come to see me.

## 2018-12-05 ENCOUNTER — Ambulatory Visit: Payer: Medicare Other | Admitting: Psychology

## 2018-12-19 ENCOUNTER — Ambulatory Visit (INDEPENDENT_AMBULATORY_CARE_PROVIDER_SITE_OTHER): Payer: Medicare Other | Admitting: Psychology

## 2018-12-19 DIAGNOSIS — F4323 Adjustment disorder with mixed anxiety and depressed mood: Secondary | ICD-10-CM

## 2019-01-02 ENCOUNTER — Ambulatory Visit: Payer: Medicare Other | Admitting: Psychology

## 2019-01-10 ENCOUNTER — Encounter: Payer: Self-pay | Admitting: Internal Medicine

## 2019-01-16 ENCOUNTER — Ambulatory Visit: Payer: Medicare Other | Admitting: Psychology

## 2019-01-30 ENCOUNTER — Ambulatory Visit: Payer: Medicare Other | Admitting: Psychology

## 2019-02-13 ENCOUNTER — Ambulatory Visit: Payer: Medicare Other | Admitting: Psychology

## 2019-02-27 ENCOUNTER — Ambulatory Visit: Payer: Medicare Other | Admitting: Psychology

## 2019-04-24 ENCOUNTER — Ambulatory Visit: Payer: Medicare Other | Admitting: Psychology

## 2019-04-24 ENCOUNTER — Ambulatory Visit (HOSPITAL_COMMUNITY): Payer: Medicare Other | Admitting: Psychiatry

## 2019-05-08 ENCOUNTER — Ambulatory Visit: Payer: Medicare Other | Admitting: Psychology

## 2019-06-20 ENCOUNTER — Other Ambulatory Visit: Payer: Self-pay | Admitting: Urology

## 2019-06-20 DIAGNOSIS — N403 Nodular prostate with lower urinary tract symptoms: Secondary | ICD-10-CM

## 2019-06-20 DIAGNOSIS — R972 Elevated prostate specific antigen [PSA]: Secondary | ICD-10-CM

## 2019-07-17 ENCOUNTER — Other Ambulatory Visit: Payer: Self-pay

## 2019-07-17 ENCOUNTER — Ambulatory Visit
Admission: RE | Admit: 2019-07-17 | Discharge: 2019-07-17 | Disposition: A | Discharge status: Home / Self Care | Payer: Medicare Other | Source: Ambulatory Visit | Attending: Urology | Admitting: Urology

## 2019-07-17 DIAGNOSIS — N403 Nodular prostate with lower urinary tract symptoms: Secondary | ICD-10-CM

## 2019-07-17 DIAGNOSIS — R972 Elevated prostate specific antigen [PSA]: Secondary | ICD-10-CM

## 2019-07-17 MED ORDER — GADOBENATE DIMEGLUMINE 529 MG/ML IV SOLN
16.0000 mL | Freq: Once | INTRAVENOUS | Status: AC | PRN
  Administered 2019-07-17: 16:00:00 16 mL via INTRAVENOUS

## 2020-01-09 ENCOUNTER — Other Ambulatory Visit (HOSPITAL_COMMUNITY): Payer: Self-pay | Admitting: Psychiatry

## 2020-02-16 ENCOUNTER — Other Ambulatory Visit (HOSPITAL_COMMUNITY): Payer: Self-pay | Admitting: Psychiatry

## 2020-09-04 ENCOUNTER — Ambulatory Visit (INDEPENDENT_AMBULATORY_CARE_PROVIDER_SITE_OTHER): Payer: Medicare Other | Admitting: Internal Medicine

## 2020-09-04 ENCOUNTER — Other Ambulatory Visit: Payer: Self-pay

## 2020-09-04 ENCOUNTER — Encounter: Payer: Self-pay | Admitting: Internal Medicine

## 2020-09-04 VITALS — BP 124/70 | HR 95 | Temp 98.2°F | Ht 66.5 in | Wt 174.7 lb

## 2020-09-04 DIAGNOSIS — E7800 Pure hypercholesterolemia, unspecified: Secondary | ICD-10-CM

## 2020-09-04 DIAGNOSIS — Z0000 Encounter for general adult medical examination without abnormal findings: Secondary | ICD-10-CM

## 2020-09-04 DIAGNOSIS — E119 Type 2 diabetes mellitus without complications: Secondary | ICD-10-CM | POA: Diagnosis not present

## 2020-09-04 DIAGNOSIS — I10 Essential (primary) hypertension: Secondary | ICD-10-CM | POA: Diagnosis not present

## 2020-09-04 DIAGNOSIS — Z794 Long term (current) use of insulin: Secondary | ICD-10-CM

## 2020-09-04 LAB — CBC WITH DIFFERENTIAL/PLATELET
Basophils Absolute: 0 10*3/uL (ref 0.0–0.1)
Basophils Relative: 0.6 % (ref 0.0–3.0)
Eosinophils Absolute: 0.2 10*3/uL (ref 0.0–0.7)
Eosinophils Relative: 2.8 % (ref 0.0–5.0)
HCT: 46.3 % (ref 39.0–52.0)
Hemoglobin: 15.6 g/dL (ref 13.0–17.0)
Lymphocytes Relative: 25.9 % (ref 12.0–46.0)
Lymphs Abs: 1.6 10*3/uL (ref 0.7–4.0)
MCHC: 33.7 g/dL (ref 30.0–36.0)
MCV: 92.1 fl (ref 78.0–100.0)
Monocytes Absolute: 0.6 10*3/uL (ref 0.1–1.0)
Monocytes Relative: 9.7 % (ref 3.0–12.0)
Neutro Abs: 3.9 10*3/uL (ref 1.4–7.7)
Neutrophils Relative %: 61 % (ref 43.0–77.0)
Platelets: 171 10*3/uL (ref 150.0–400.0)
RBC: 5.03 Mil/uL (ref 4.22–5.81)
RDW: 13.5 % (ref 11.5–15.5)
WBC: 6.3 10*3/uL (ref 4.0–10.5)

## 2020-09-04 LAB — COMPREHENSIVE METABOLIC PANEL
ALT: 22 U/L (ref 0–53)
AST: 18 U/L (ref 0–37)
Albumin: 4 g/dL (ref 3.5–5.2)
Alkaline Phosphatase: 52 U/L (ref 39–117)
BUN: 18 mg/dL (ref 6–23)
CO2: 26 mEq/L (ref 19–32)
Calcium: 9.7 mg/dL (ref 8.4–10.5)
Chloride: 103 mEq/L (ref 96–112)
Creatinine, Ser: 0.88 mg/dL (ref 0.40–1.50)
GFR: 85.74 mL/min (ref >60.00)
Glucose, Bld: 100 mg/dL — ABNORMAL HIGH (ref 70–99)
Potassium: 3.7 mEq/L (ref 3.5–5.1)
Sodium: 139 mEq/L (ref 135–145)
Total Bilirubin: 1.2 mg/dL (ref 0.2–1.2)
Total Protein: 6.5 g/dL (ref 6.0–8.3)

## 2020-09-04 LAB — LIPID PANEL
Cholesterol: 98 mg/dL (ref 0–200)
HDL: 42.1 mg/dL (ref >39.00)
LDL Cholesterol: 43 mg/dL (ref 0–99)
NonHDL: 55.49
Total CHOL/HDL Ratio: 2
Triglycerides: 63 mg/dL (ref 0.0–149.0)
VLDL: 12.6 mg/dL (ref 0.0–40.0)

## 2020-09-04 LAB — VITAMIN B12: Vitamin B-12: 1081 pg/mL — ABNORMAL HIGH (ref 211–911)

## 2020-09-04 LAB — VITAMIN D 25 HYDROXY (VIT D DEFICIENCY, FRACTURES): VITD: 93.36 ng/mL (ref 30.00–100.00)

## 2020-09-04 LAB — TSH: TSH: 2.34 u[IU]/mL (ref 0.35–5.50)

## 2020-09-04 LAB — HEMOGLOBIN A1C: Hgb A1c MFr Bld: 7.6 % — ABNORMAL HIGH (ref 4.6–6.5)

## 2020-09-04 NOTE — Progress Notes (Signed)
Established Patient Office Visit     This visit occurred during the SARS-CoV-2 public health emergency.  Safety protocols were in place, including screening questions prior to the visit, additional usage of staff PPE, and extensive cleaning of exam room while observing appropriate contact time as indicated for disinfecting solutions.    CC/Reason for Visit: Annual preventive exam and subsequent Medicare wellness visit  HPI: Kenneth Little is a 75 y.o. male who is coming in today for the above mentioned reasons. Past Medical History is significant for: Hypertension, type 2 diabetes hyperlipidemia.  He sees Dr. Grandville Silos for his diabetic care.  I have not seen him since November 2019.  He has routine eye and dental care.  No perceived hearing issues, he golfs and does strength training on a daily basis, he is quite physically active.  He has had all 4 COVID vaccines, shingles and pneumonia, is overdue for flu and Tdap.  He had a colonoscopy in 2015 and is a 10-year callback.  He has no acute concerns or complaints today.   Past Medical/Surgical History: Past Medical History:  Diagnosis Date   Diabetes mellitus without complication ([place])    Enlarged prostate    Hyperlipidemia    Hypertension    UTI (lower urinary tract infection)     Past Surgical History:  Procedure Laterality Date   CHOLECYSTECTOMY N/A 12/24/2012   Procedure: LAPAROSCOPIC CHOLECYSTECTOMY WITH INTRAOPERATIVE CHOLANGIOGRAM;  Surgeon: Shann Medal, MD;  [facility];  Service: General;  Laterality: N/A;   CYSTOSCOPY WITH BIOPSY N/A 03/22/2016   Procedure: CYSTOSCOPY WITH BLADDER  BIOPSY WITH FULGURATION;  Surgeon: Irine Seal, MD;  Location: [facility];  Service: Urology;  Laterality: N/A;   diverticulitis     ERCP N/A 12/23/2012   Procedure: ENDOSCOPIC RETROGRADE CHOLANGIOPANCREATOGRAPHY (ERCP);  Surgeon: Jeryl Columbia, MD;  [facility];  Service: Endoscopy;  Laterality: N/A;   HEMORRHOID  SURGERY  03/20/12   internal   HERNIA REPAIR Bilateral    childhood    Social History:  reports that he quit smoking about 31 years ago. His smoking use included cigarettes. He has a 44.00 pack-year smoking history. He has never used smokeless tobacco. He reports that he does not drink alcohol and does not use drugs.  Allergies: Allergies  Allergen Reactions   Ace Inhibitors Cough and Itching   Hydrochlorothiazide Cough and Itching    Family History:  Family History  Problem Relation Age of Onset   Cancer Mother        breast   Hypertension Father    Diabetes Brother      Current Outpatient Medications:    alfuzosin (UROXATRAL) 10 MG 24 hr tablet, Take 10 mg by mouth every evening., Disp: , Rfl:    atorvastatin (LIPITOR) 40 MG tablet, Take 1 tablet (40 mg total) by mouth every evening., Disp: 90 tablet, Rfl: 5   calcium-vitamin D (OSCAL WITH D) 500-200 MG-UNIT per tablet, Take 1 tablet by mouth every morning., Disp: , Rfl:    insulin glargine (LANTUS) 100 UNIT/ML injection, Inject 25 Units into the skin at bedtime. , Disp: , Rfl:    INVOKANA 300 MG TABS, Take 1 tablet by mouth every morning. , Disp: , Rfl:    metFORMIN (GLUCOPHAGE) 1000 MG tablet, Take 1,000 mg by mouth 2 (two) times daily with a meal., Disp: , Rfl:    olmesartan (BENICAR) 40 MG tablet, Take 1 tablet (40 mg total) by mouth daily., Disp:  90 tablet, Rfl: 4   ONETOUCH VERIO test strip, , Disp: , Rfl:    oxybutynin (DITROPAN-XL) 5 MG 24 hr tablet, Take 1 tablet (5 mg total) by mouth at bedtime., Disp: 60 tablet, Rfl: 4   pioglitazone (ACTOS) 45 MG tablet, Take 1 tablet (45 mg total) by mouth every morning., Disp: 90 tablet, Rfl: 5   sitaGLIPtin (JANUVIA) 100 MG tablet, Take 1 tablet (100 mg total) by mouth every evening., Disp: 90 tablet, Rfl: 5   TURMERIC PO, Take by mouth., Disp: , Rfl:    Vitamin D, Ergocalciferol, (DRISDOL) 50000 units CAPS capsule, Take 50,000 Units by mouth every 7 (seven) days., Disp: , Rfl:    Review of Systems:  Constitutional: Denies fever, chills, diaphoresis, appetite change and fatigue.  HEENT: Denies photophobia, eye pain, redness, hearing loss, ear pain, congestion, sore throat, rhinorrhea, sneezing, mouth sores, trouble swallowing, neck pain, neck stiffness and tinnitus.   Respiratory: Denies SOB, DOE, cough, chest tightness,  and wheezing.   Cardiovascular: Denies chest pain, palpitations and leg swelling.  Gastrointestinal: Denies nausea, vomiting, abdominal pain, diarrhea, constipation, blood in stool and abdominal distention.  Genitourinary: Denies dysuria, urgency, frequency, hematuria, flank pain and difficulty urinating.  Endocrine: Denies: hot or cold intolerance, sweats, changes in hair or nails, polyuria, polydipsia. Musculoskeletal: Denies myalgias, back pain, joint swelling, arthralgias and gait problem.  Skin: Denies pallor, rash and wound.  Neurological: Denies dizziness, seizures, syncope, weakness, light-headedness, numbness and headaches.  Hematological: Denies adenopathy. Easy bruising, personal or family bleeding history  Psychiatric/Behavioral: Denies suicidal ideation, mood changes, confusion, nervousness, sleep disturbance and agitation    Physical Exam: Vitals:   09/04/20 0821  BP: 124/70  Pulse: 95  Temp: 98.2 F (36.8 C)  TempSrc: Oral  SpO2: (!) 82%  Weight: 174 lb 11.2 oz (79.2 kg)  Height: 5' 6.5" (1.689 m)    Body mass index is 27.77 kg/m.   Constitutional: NAD, calm, comfortable Eyes: PERRL, lids and conjunctivae normal ENMT: Mucous membranes are moist. Posterior pharynx clear of any exudate or lesions. Normal dentition. Tympanic membrane is pearly white, no erythema or bulging. Neck: normal, supple, no masses, no thyromegaly Respiratory: clear to auscultation bilaterally, no wheezing, no crackles. Normal respiratory effort. No accessory muscle use.  Cardiovascular: Regular rate and rhythm, no murmurs / rubs / gallops. No  extremity edema. 2+ pedal pulses. No carotid bruits.  Abdomen: no tenderness, no masses palpated. No hepatosplenomegaly. Bowel sounds positive.  Musculoskeletal: no clubbing / cyanosis. No joint deformity upper and lower extremities. Good ROM, no contractures. Normal muscle tone.  Skin: no rashes, lesions, ulcers. No induration Neurologic: CN 2-12 grossly intact. Sensation intact, DTR normal. Strength 5/5 in all 4.  Psychiatric: Normal judgment and insight. Alert and oriented x 3. Normal mood.    Subsequent Medicare wellness visit   1. Risk factors, based on past  M,S,F -cardiovascular disease risk factors include age, gender, history of hypertension, hyperlipidemia and diabetes   2.  Physical activities: He is very physically active, he golfs on a daily basis, he does at least an hour of strength training every day   3.  Depression/mood: Stable, not depressed   4.  Hearing: No perceived issues   5.  ADL's: Independent in all ADLs   6.  Fall risk: Low fall risk   7.  Home safety: No problems identified   8.  Height weight, and visual acuity: height and weight as above, vision:  Vision Screening   Right eye Left eye  Both eyes  Without correction patient declined patient declined patient declined  With correction     Comments: Patient has eye exam 09/21/20    9.  Counseling: Advised he update his Tdap vaccine and flu vaccine when available   10. Lab orders based on risk factors: Laboratory update will be reviewed   11. Referral : None today   12. Care plan: Follow-up with me in 1 year   13. Cognitive assessment: No cognitive impairment   14. Screening: Patient provided with a written and personalized 5-10 year screening schedule in the AVS. yes   15. Provider List Update: PCP, endocrinology Dr. Grandville Silos  16. Advance Directives: Full code   17. Opioids: Patient is not on any opioid prescriptions and has no risk factors for a substance use disorder.     Fall Risk   09/04/2020 12/01/2017 10/31/2017 10/30/2017 10/27/2016  Falls in the past year? 0 0 No No No  Number falls in past yr: 0 0 - - -  Injury with Fall? 0 0 - - -     Impression and Plan:  Encounter for preventive health examination -He has routine eye and dental care. -He will get Tdap at pharmacy and flu once available. -Screening labs today. -Healthy lifestyle discussed in detail. -He had a colonoscopy in 2015 and is a 10-year callback. -He follows semiannually with urology, his last PSA in March was 4.630.  Type 2 diabetes mellitus without complication, with long-term current use of insulin ([place])  - Plan: Hemoglobin A1c  Hypercholesterolemia  -Check lipids today. -He is on atorvastatin 40 mg daily. -Last lipid panel on file is from 2017: Total cholesterol 113, triglycerides 54 and LDL 48.  Primary hypertension  -Well-controlled today.  On olmesartan 40 mg daily.    Patient Instructions  -Nice seeing you today!!  -Lab work today; will notify you once results are available.  -Remember to get your tetanus vaccine (Tdap) at the pharmacy.  -Schedule follow up in 1 year or sooner as needed.      Lelon Frohlich, MD [facility]

## 2020-09-04 NOTE — Addendum Note (Signed)
Addended by: Amanda Cockayne on: 09/04/2020 09:01 AM   Modules accepted: Orders

## 2020-09-04 NOTE — Patient Instructions (Signed)
-  Nice seeing you today!!  -Lab work today; will notify you once results are available.  -Remember to get your tetanus vaccine (Tdap) at the pharmacy.  -Schedule follow up in 1 year or sooner as needed.

## 2020-09-24 ENCOUNTER — Telehealth: Payer: Self-pay | Admitting: Internal Medicine

## 2020-09-24 NOTE — Telephone Encounter (Signed)
Copy ready for pick up and patient is aware.

## 2020-09-24 NOTE — Telephone Encounter (Signed)
PT called to see if he can get a copy of the lab results from August 26 when he came in for his physical. Please contact PT when they are ready.

## 2020-09-28 LAB — PSA
PSA: 5.25
PSA: 5.25
PSA: 5.25

## 2020-10-15 ENCOUNTER — Encounter: Payer: Self-pay | Admitting: Internal Medicine

## 2020-12-14 ENCOUNTER — Encounter: Payer: Self-pay | Admitting: Urology

## 2020-12-17 ENCOUNTER — Encounter: Payer: Self-pay | Admitting: Internal Medicine

## 2020-12-18 ENCOUNTER — Encounter: Payer: Self-pay | Admitting: Internal Medicine

## 2021-03-01 ENCOUNTER — Telehealth: Payer: Self-pay | Admitting: Internal Medicine

## 2021-03-01 ENCOUNTER — Telehealth (INDEPENDENT_AMBULATORY_CARE_PROVIDER_SITE_OTHER): Payer: Medicare Other | Admitting: Internal Medicine

## 2021-03-01 ENCOUNTER — Encounter: Payer: Self-pay | Admitting: Internal Medicine

## 2021-03-01 VITALS — BP 133/81 | HR 92 | Wt 176.0 lb

## 2021-03-01 DIAGNOSIS — U071 COVID-19: Secondary | ICD-10-CM | POA: Diagnosis not present

## 2021-03-01 DIAGNOSIS — R059 Cough, unspecified: Secondary | ICD-10-CM | POA: Diagnosis not present

## 2021-03-01 DIAGNOSIS — R6883 Chills (without fever): Secondary | ICD-10-CM

## 2021-03-01 DIAGNOSIS — R051 Acute cough: Secondary | ICD-10-CM | POA: Diagnosis not present

## 2021-03-01 LAB — POCT INFLUENZA A/B
Influenza A, POC: NEGATIVE
Influenza B, POC: NEGATIVE

## 2021-03-01 LAB — POC COVID19 BINAXNOW: SARS Coronavirus 2 Ag: POSITIVE — AB

## 2021-03-01 MED ORDER — MOLNUPIRAVIR EUA 200MG CAPSULE: 4.0000 | ORAL_CAPSULE | Freq: Two times a day (BID) | ORAL | 0 refills | Status: AC

## 2021-03-01 NOTE — Telephone Encounter (Signed)
Patient called in stating that his son ordered him some vit c, vit d, and zinc. Patient is asking if he should take them or not.  Patient could be contacted at [phone].  Please advise.

## 2021-03-01 NOTE — Progress Notes (Signed)
Virtual Visit via Video Note  I connected with Kenneth Little on 03/01/21 at  3:30 PM EST by a video enabled telemedicine application and verified that I am speaking with the correct person using two identifiers.  Location patient: home Location provider: work office Persons participating in the virtual visit: patient, provider  I discussed the limitations of evaluation and management by telemedicine and the availability of in person appointments. The patient expressed understanding and agreed to proceed.   HPI: He scheduled this visit to discuss exposure to [place].  His niece was visiting from [place] over the weekend.  She had a cough.  While there she took a COVID test that resulted positive.  Yesterday he noticed cough and chills.  This morning it progressed to having a sore throat and fatigue.  He came in the office where flu testing has resulted negative, however COVID test was positive.  He has not had any fevers or shortness of breath.   ROS: Constitutional: Denies fever, diaphoresis, appetite change. HEENT: Denies photophobia, eye pain, redness,  mouth sores, trouble swallowing, neck pain, neck stiffness and tinnitus.   Respiratory: Denies SOB, DOE, c chest tightness,  and wheezing.   Cardiovascular: Denies chest pain, palpitations and leg swelling.  Gastrointestinal: Denies nausea, vomiting, abdominal pain, diarrhea, constipation, blood in stool and abdominal distention.  Genitourinary: Denies dysuria, urgency, frequency, hematuria, flank pain and difficulty urinating.  Endocrine: Denies: hot or cold intolerance, sweats, changes in hair or nails, polyuria, polydipsia. Musculoskeletal: Denies myalgias, back pain, joint swelling, arthralgias and gait problem.  Skin: Denies pallor, rash and wound.  Neurological: Denies dizziness, seizures, syncope,  light-headedness, numbness and headaches.  Hematological: Denies adenopathy. Easy bruising, personal or family bleeding history   Psychiatric/Behavioral: Denies suicidal ideation, mood changes, confusion, nervousness, sleep disturbance and agitation   Past Medical History:  Diagnosis Date   Diabetes mellitus without complication ([place])    Enlarged prostate    Hyperlipidemia    Hypertension    UTI (lower urinary tract infection)     Past Surgical History:  Procedure Laterality Date   CHOLECYSTECTOMY N/A 12/24/2012   Procedure: LAPAROSCOPIC CHOLECYSTECTOMY WITH INTRAOPERATIVE CHOLANGIOGRAM;  Surgeon: Shann Medal, MD;  Location: [facility];  Service: General;  Laterality: N/A;   CYSTOSCOPY WITH BIOPSY N/A 03/22/2016   Procedure: CYSTOSCOPY WITH BLADDER  BIOPSY WITH FULGURATION;  Surgeon: Irine Seal, MD;  Location: [place];  Service: Urology;  Laterality: N/A;   diverticulitis     ERCP N/A 12/23/2012   Procedure: ENDOSCOPIC RETROGRADE CHOLANGIOPANCREATOGRAPHY (ERCP);  Surgeon: Jeryl Columbia, MD;  Location: [facility];  Service: Endoscopy;  Laterality: N/A;   HEMORRHOID SURGERY  03/20/12   internal   HERNIA REPAIR Bilateral    childhood    Family History  Problem Relation Age of Onset   Cancer Mother        breast   Hypertension Father    Diabetes Brother     SOCIAL HX:   reports that he quit smoking about 32 years ago. His smoking use included cigarettes. He has a 44.00 pack-year smoking history. He has never used smokeless tobacco. He reports that he does not drink alcohol and does not use drugs.   Current Outpatient Medications:    molnupiravir EUA (LAGEVRIO) 200 mg CAPS capsule, Take 4 capsules (800 mg total) by mouth 2 (two) times daily for 5 days., Disp: 40 capsule, Rfl: 0   alfuzosin (UROXATRAL) 10 MG 24 hr tablet, Take 10 mg by  mouth every evening., Disp: , Rfl:    atorvastatin (LIPITOR) 40 MG tablet, Take 1 tablet (40 mg total) by mouth every evening., Disp: 90 tablet, Rfl: 5   calcium-vitamin D (OSCAL WITH D) 500-200 MG-UNIT per tablet, Take 1 tablet by mouth every morning., Disp: ,  Rfl:    insulin glargine (LANTUS) 100 UNIT/ML injection, Inject 25 Units into the skin at bedtime. , Disp: , Rfl:    INVOKANA 300 MG TABS, Take 1 tablet by mouth every morning. , Disp: , Rfl:    metFORMIN (GLUCOPHAGE) 1000 MG tablet, Take 1,000 mg by mouth 2 (two) times daily with a meal., Disp: , Rfl:    olmesartan (BENICAR) 40 MG tablet, Take 1 tablet (40 mg total) by mouth daily., Disp: 90 tablet, Rfl: 4   ONETOUCH VERIO test strip, , Disp: , Rfl:    oxybutynin (DITROPAN-XL) 5 MG 24 hr tablet, Take 1 tablet (5 mg total) by mouth at bedtime., Disp: 60 tablet, Rfl: 4   pioglitazone (ACTOS) 45 MG tablet, Take 1 tablet (45 mg total) by mouth every morning., Disp: 90 tablet, Rfl: 5   sitaGLIPtin (JANUVIA) 100 MG tablet, Take 1 tablet (100 mg total) by mouth every evening., Disp: 90 tablet, Rfl: 5   TURMERIC PO, Take by mouth., Disp: , Rfl:    Vitamin D, Ergocalciferol, (DRISDOL) 50000 units CAPS capsule, Take 50,000 Units by mouth every 7 (seven) days., Disp: , Rfl:   EXAM:   VITALS per patient if applicable: None reported  GENERAL: alert, oriented, appears well and in no acute distress  HEENT: atraumatic, conjunttiva clear, no obvious abnormalities on inspection of external nose and ears  NECK: normal movements of the head and neck  LUNGS: on inspection no signs of respiratory distress, breathing rate appears normal, no obvious gross increased work of breathing, gasping or wheezing  CV: no obvious cyanosis  MS: moves all visible extremities without noticeable abnormality  PSYCH/NEURO: pleasant and cooperative, no obvious depression or anxiety, speech and thought processing grossly intact  ASSESSMENT AND PLAN:   Chills - Plan: POCT Influenza A/B  Acute cough - Plan: POCT Influenza A/B, POC COVID-19  COVID-19 - Plan: molnupiravir EUA (LAGEVRIO) 200 mg CAPS capsule  -In office COVID test has resulted negative, flu a and B-. -He may also use OTC medications such as antihistamines,  decongestants, pain relievers, guaifenesin. -We have reviewed quarantine period of 5 days. -We have discussed symptoms that would promote ED evaluation. -He knows to follow with [place] if symptoms fail to resolve.      I discussed the assessment and treatment plan with the patient. The patient was provided an opportunity to ask questions and all were answered. The patient agreed with the plan and demonstrated an understanding of the instructions.   The patient was advised to call back or seek an in-person evaluation if the symptoms worsen or if the condition fails to improve as anticipated.    Lelon Frohlich, MD  [facility] at [facility]

## 2021-03-01 NOTE — Telephone Encounter (Signed)
Left a detailed message with the information below at the patient's home number. 

## 2021-03-22 LAB — PSA
PSA: 4.48
PSA: 4.48

## 2021-03-31 ENCOUNTER — Encounter: Payer: Self-pay | Admitting: Internal Medicine

## 2021-06-28 ENCOUNTER — Encounter: Payer: Self-pay | Admitting: Internal Medicine

## 2021-08-23 LAB — PSA: PSA: 4.48

## 2021-08-24 ENCOUNTER — Encounter: Payer: Self-pay | Admitting: Internal Medicine

## 2021-08-24 ENCOUNTER — Other Ambulatory Visit: Payer: Self-pay | Admitting: Urology

## 2021-09-21 ENCOUNTER — Other Ambulatory Visit: Payer: Self-pay | Admitting: Urology

## 2021-09-28 ENCOUNTER — Encounter: Payer: Self-pay | Admitting: Internal Medicine

## 2021-09-28 ENCOUNTER — Ambulatory Visit (INDEPENDENT_AMBULATORY_CARE_PROVIDER_SITE_OTHER): Payer: Medicare Other | Admitting: Internal Medicine

## 2021-09-28 VITALS — BP 122/80 | HR 82 | Temp 97.7°F | Ht 65.5 in | Wt 170.9 lb

## 2021-09-28 DIAGNOSIS — E119 Type 2 diabetes mellitus without complications: Secondary | ICD-10-CM | POA: Diagnosis not present

## 2021-09-28 DIAGNOSIS — N400 Enlarged prostate without lower urinary tract symptoms: Secondary | ICD-10-CM

## 2021-09-28 DIAGNOSIS — R972 Elevated prostate specific antigen [PSA]: Secondary | ICD-10-CM

## 2021-09-28 DIAGNOSIS — I10 Essential (primary) hypertension: Secondary | ICD-10-CM | POA: Diagnosis not present

## 2021-09-28 DIAGNOSIS — E7800 Pure hypercholesterolemia, unspecified: Secondary | ICD-10-CM

## 2021-09-28 DIAGNOSIS — Z794 Long term (current) use of insulin: Secondary | ICD-10-CM | POA: Diagnosis not present

## 2021-09-28 DIAGNOSIS — Z0000 Encounter for general adult medical examination without abnormal findings: Secondary | ICD-10-CM | POA: Diagnosis not present

## 2021-09-28 LAB — LIPID PANEL
Cholesterol: 115 mg/dL (ref 0–200)
HDL: 50.9 mg/dL (ref >39.00)
LDL Cholesterol: 52 mg/dL (ref 0–99)
NonHDL: 64.08
Total CHOL/HDL Ratio: 2
Triglycerides: 62 mg/dL (ref 0.0–149.0)
VLDL: 12.4 mg/dL (ref 0.0–40.0)

## 2021-09-28 LAB — CBC WITH DIFFERENTIAL/PLATELET
Basophils Absolute: 0 10*3/uL (ref 0.0–0.1)
Basophils Relative: 0.7 % (ref 0.0–3.0)
Eosinophils Absolute: 0.2 10*3/uL (ref 0.0–0.7)
Eosinophils Relative: 3.5 % (ref 0.0–5.0)
HCT: 47.4 % (ref 39.0–52.0)
Hemoglobin: 15.8 g/dL (ref 13.0–17.0)
Lymphocytes Relative: 36.8 % (ref 12.0–46.0)
Lymphs Abs: 2.2 10*3/uL (ref 0.7–4.0)
MCHC: 33.3 g/dL (ref 30.0–36.0)
MCV: 93.9 fl (ref 78.0–100.0)
Monocytes Absolute: 0.5 10*3/uL (ref 0.1–1.0)
Monocytes Relative: 9.2 % (ref 3.0–12.0)
Neutro Abs: 2.9 10*3/uL (ref 1.4–7.7)
Neutrophils Relative %: 49.8 % (ref 43.0–77.0)
Platelets: 161 10*3/uL (ref 150.0–400.0)
RBC: 5.05 Mil/uL (ref 4.22–5.81)
RDW: 13.5 % (ref 11.5–15.5)
WBC: 5.9 10*3/uL (ref 4.0–10.5)

## 2021-09-28 LAB — COMPREHENSIVE METABOLIC PANEL
ALT: 23 U/L (ref 0–53)
AST: 18 U/L (ref 0–37)
Albumin: 3.9 g/dL (ref 3.5–5.2)
Alkaline Phosphatase: 62 U/L (ref 39–117)
BUN: 24 mg/dL — ABNORMAL HIGH (ref 6–23)
CO2: 28 mEq/L (ref 19–32)
Calcium: 9.4 mg/dL (ref 8.4–10.5)
Chloride: 102 mEq/L (ref 96–112)
Creatinine, Ser: 0.86 mg/dL (ref 0.40–1.50)
GFR: 85.7 mL/min (ref >60.00)
Glucose, Bld: 117 mg/dL — ABNORMAL HIGH (ref 70–99)
Potassium: 3.7 mEq/L (ref 3.5–5.1)
Sodium: 138 mEq/L (ref 135–145)
Total Bilirubin: 1.3 mg/dL — ABNORMAL HIGH (ref 0.2–1.2)
Total Protein: 6.8 g/dL (ref 6.0–8.3)

## 2021-09-28 LAB — POCT GLYCOSYLATED HEMOGLOBIN (HGB A1C): Hemoglobin A1C: 7.9 % — AB (ref 4.0–5.6)

## 2021-09-28 LAB — MICROALBUMIN / CREATININE URINE RATIO
Creatinine,U: 55 mg/dL
Microalb Creat Ratio: 3.2 mg/g (ref 0.0–30.0)
Microalb, Ur: 1.7 mg/dL (ref 0.0–1.9)

## 2021-09-28 NOTE — Progress Notes (Signed)
Established Patient Office Visit     CC/Reason for Visit: Annual preventive exam and subsequent Medicare wellness visit  HPI: Kenneth Little is a 76 y.o. male who is coming in today for the above mentioned reasons. Past Medical History is significant for: Hypertension, hyperlipidemia, type 2 diabetes, symptomatic BPH.  He tells me he is scheduled for a prostatectomy on October 20 with Dr. Tresa Moore.  He follows with an outside endocrinologist, Dr. Grandville Silos with [facility] for his diabetes.  All immunizations are up-to-date and chart has been updated.  He has no acute concerns or complaints.   Past Medical/Surgical History: Past Medical History:  Diagnosis Date   Diabetes mellitus without complication ([place])    Enlarged prostate    Hyperlipidemia    Hypertension    UTI (lower urinary tract infection)     Past Surgical History:  Procedure Laterality Date   CHOLECYSTECTOMY N/A 12/24/2012   Procedure: LAPAROSCOPIC CHOLECYSTECTOMY WITH INTRAOPERATIVE CHOLANGIOGRAM;  Surgeon: Shann Medal, MD;  [facility];  Service: General;  Laterality: N/A;   CYSTOSCOPY WITH BIOPSY N/A 03/22/2016   Procedure: CYSTOSCOPY WITH BLADDER  BIOPSY WITH FULGURATION;  Surgeon: Irine Seal, MD;  Location: [place];  Service: Urology;  Laterality: N/A;   diverticulitis     ERCP N/A 12/23/2012   Procedure: ENDOSCOPIC RETROGRADE CHOLANGIOPANCREATOGRAPHY (ERCP);  Surgeon: Jeryl Columbia, MD;  [facility];  Service: Endoscopy;  Laterality: N/A;   HEMORRHOID SURGERY  03/20/12   internal   HERNIA REPAIR Bilateral    childhood    Social History:  reports that he quit smoking about 32 years ago. His smoking use included cigarettes. He has a 44.00 pack-year smoking history. He has never used smokeless tobacco. He reports that he does not drink alcohol and does not use drugs.  Allergies: Allergies  Allergen Reactions   Ace Inhibitors Cough and Itching   Hydrochlorothiazide Cough and  Itching    Family History:  Family History  Problem Relation Age of Onset   Cancer Mother        breast   Hypertension Father    Diabetes Brother      Current Outpatient Medications:    alfuzosin (UROXATRAL) 10 MG 24 hr tablet, Take 10 mg by mouth every evening., Disp: , Rfl:    aspirin EC 81 MG tablet, Take 81 mg by mouth daily. Swallow whole., Disp: , Rfl:    atorvastatin (LIPITOR) 40 MG tablet, Take 1 tablet (40 mg total) by mouth every evening., Disp: 90 tablet, Rfl: 5   calcium-vitamin D (OSCAL WITH D) 500-200 MG-UNIT per tablet, Take 1 tablet by mouth every morning., Disp: , Rfl:    finasteride (PROSCAR) 5 MG tablet, Take 5 mg by mouth daily., Disp: , Rfl:    insulin glargine (LANTUS) 100 UNIT/ML injection, Inject 25 Units into the skin at bedtime. , Disp: , Rfl:    JARDIANCE 25 MG TABS tablet, Take 25 mg by mouth daily., Disp: , Rfl:    metFORMIN (GLUCOPHAGE) 1000 MG tablet, Take 1,000 mg by mouth 2 (two) times daily with a meal., Disp: , Rfl:    olmesartan (BENICAR) 40 MG tablet, Take 1 tablet (40 mg total) by mouth daily., Disp: 90 tablet, Rfl: 4   ONETOUCH VERIO test strip, , Disp: , Rfl:    pioglitazone (ACTOS) 45 MG tablet, Take 1 tablet (45 mg total) by mouth every morning., Disp: 90 tablet, Rfl: 5   sitaGLIPtin (JANUVIA) 100 MG tablet,  Take 1 tablet (100 mg total) by mouth every evening., Disp: 90 tablet, Rfl: 5   TURMERIC PO, Take by mouth., Disp: , Rfl:    Vitamin D, Ergocalciferol, (DRISDOL) 50000 units CAPS capsule, Take 50,000 Units by mouth every 7 (seven) days., Disp: , Rfl:    INVOKANA 300 MG TABS, Take 1 tablet by mouth every morning.  (Patient not taking: Reported on 09/28/2021), Disp: , Rfl:    oxybutynin (DITROPAN-XL) 5 MG 24 hr tablet, Take 1 tablet (5 mg total) by mouth at bedtime. (Patient not taking: Reported on 09/28/2021), Disp: 60 tablet, Rfl: 4  Review of Systems:  Constitutional: Denies fever, chills, diaphoresis, appetite change and fatigue.  HEENT:  Denies photophobia, eye pain, redness, hearing loss, ear pain, congestion, sore throat, rhinorrhea, sneezing, mouth sores, trouble swallowing, neck pain, neck stiffness and tinnitus.   Respiratory: Denies SOB, DOE, cough, chest tightness,  and wheezing.   Cardiovascular: Denies chest pain, palpitations and leg swelling.  Gastrointestinal: Denies nausea, vomiting, abdominal pain, diarrhea, constipation, blood in stool and abdominal distention.  Genitourinary: Denies dysuria, urgency, frequency, hematuria, flank pain and difficulty urinating.  Endocrine: Denies: hot or cold intolerance, sweats, changes in hair or nails, polyuria, polydipsia. Musculoskeletal: Denies myalgias, back pain, joint swelling, arthralgias and gait problem.  Skin: Denies pallor, rash and wound.  Neurological: Denies dizziness, seizures, syncope, weakness, light-headedness, numbness and headaches.  Hematological: Denies adenopathy. Easy bruising, personal or family bleeding history  Psychiatric/Behavioral: Denies suicidal ideation, mood changes, confusion, nervousness, sleep disturbance and agitation    Physical Exam: Vitals:   09/28/21 0759 09/28/21 0828  BP: (!) 150/84 122/80  Pulse: 82   Temp: 97.7 F (36.5 C)   TempSrc: Oral   SpO2: 95%   Weight: 170 lb 14.4 oz (77.5 kg)   Height: 5' 5.5" (1.664 m)     Body mass index is 28.01 kg/m.   Constitutional: NAD, calm, comfortable Eyes: PERRL, lids and conjunctivae normal ENMT: Mucous membranes are moist. Posterior pharynx clear of any exudate or lesions. Normal dentition. Tympanic membrane is pearly white, no erythema or bulging. Neck: normal, supple, no masses, no thyromegaly Respiratory: clear to auscultation bilaterally, no wheezing, no crackles. Normal respiratory effort. No accessory muscle use.  Cardiovascular: Regular rate and rhythm, no murmurs / rubs / gallops. No extremity edema. 2+ pedal pulses. No carotid bruits.  Abdomen: no tenderness, no masses  palpated. No hepatosplenomegaly. Bowel sounds positive.  Musculoskeletal: no clubbing / cyanosis. No joint deformity upper and lower extremities. Good ROM, no contractures. Normal muscle tone.  Skin: no rashes, lesions, ulcers. No induration Neurologic: CN 2-12 grossly intact. Sensation intact, DTR normal. Strength 5/5 in all 4.  Psychiatric: Normal judgment and insight. Alert and oriented x 3. Normal mood.    Subsequent Medicare wellness visit   1. Risk factors, based on past  M,S,F -cardiovascular disease risk factors include age, gender, history of hypertension, diabetes and hyperlipidemia   2.  Physical activities: He works out every morning a combination of cardio and strength training   3.  Depression/mood: Stable, not depressed   4.  Hearing: No perceived issues   5.  ADL's: Independent in all ADLs   6.  Fall risk: Low fall risk   7.  Home safety: No problems identified   8.  Height weight, and visual acuity: height and weight as above, vision:  Vision Screening   Right eye Left eye Both eyes  Without correction     With correction 20/70 20/50 20/50  9.  Counseling: Discussed healthy lifestyle in detail   10. Lab orders based on risk factors: Laboratory update will be reviewed   11. Referral : None today   12. Care plan: Follow-up with me in 1 year or sooner as needed   13. Cognitive assessment: No Active impairment   14. Screening: Patient provided with a written and personalized 5-10 year screening schedule in the AVS. yes   15. Provider List Update: PCP, urologist, endocrinologist  24. Advance Directives: Full code   17. Opioids: Patient is not on any opioid prescriptions and has no risk factors for a substance use disorder.   [place] Office Visit from 09/28/2021 in [place] at [facility]  PHQ-9 Total Score 0          10/30/2017    1:19 PM 10/31/2017    3:36 PM 12/01/2017    1:01 PM 09/04/2020    8:23 AM 09/28/2021    7:55 AM   [place] in the past year? No No 0 0 0  Was there an injury with Fall?   0 0 0  Fall Risk Category Calculator   0 0 0  Fall Risk Category   Low Low Low  Patient Fall Risk Level     Low fall risk  Patient at Risk for Falls Due to     No Fall Risks  Fall risk Follow up     Falls evaluation completed     Impression and Plan:  Type 2 diabetes mellitus without complication, with long-term current use of insulin ([place]) - Plan: POCT glycosylated hemoglobin (Hb A1C), CBC with Differential/Platelet, Comprehensive metabolic panel, Lipid panel, Microalbumin / creatinine urine ratio, Microalbumin / creatinine urine ratio, Lipid panel, Comprehensive metabolic panel, CBC with Differential/Platelet  Encounter for preventive health examination  Primary hypertension  Hypercholesterolemia  BPH with elevated PSA   -Recommend routine eye and dental care. -Immunizations: All immunizations are up-to-date -Healthy lifestyle discussed in detail. -Labs to be updated today. -Colon cancer screening: 03/2013, 10-year follow-up if needed -Breast cancer screening: Not applicable -Cervical cancer screening: Not applicable -Lung cancer screening: Not applicable -Prostate cancer screening: Scheduled for prostatectomy in October, followed by urology -DEXA: Not applicable    Raenell Mensing Isaac Bliss, MD [facility]

## 2021-10-18 NOTE — Patient Instructions (Addendum)
SURGICAL WAITING ROOM VISITATION Patients having surgery or a procedure may have no more than 2 support people in the waiting area - these visitors may rotate.   Children under the age of 77 must have an adult with them who is not the patient. If the patient needs to stay at the hospital during part of their recovery, the visitor guidelines for inpatient rooms apply. Pre-op nurse will coordinate an appropriate time for 1 support person to accompany patient in pre-op.  This support person may not rotate.    Please refer to the [facility] website for the visitor guidelines for Inpatients (after your surgery is over and you are in a regular room).      Your procedure is scheduled on: 10-29-21   Report to [facility] Main Entrance    Report to admitting at 5:15 AM   Call this number if you have problems the morning of surgery [phone]   Follow a clear liquid diet the day before surgery   After Midnight you may have the following liquids until 4:30 AM DAY OF SURGERY  Water Non-Citrus Juices (without pulp, NO RED) Carbonated Beverages Black Coffee (NO MILK/CREAM OR CREAMERS, sugar ok)  Clear Tea (NO MILK/CREAM OR CREAMERS, sugar ok) regular and decaf                             Plain Jell-O (NO RED)                                           Fruit ices (not with fruit pulp, NO RED)                                     Popsicles (NO RED)                                                               Sports drinks like Gatorade (NO RED)                   If you have questions, please contact your surgeon's office.   FOLLOW BOWEL PREP AND ANY ADDITIONAL PRE OP INSTRUCTIONS YOU RECEIVED FROM YOUR SURGEON'S OFFICE!!!   -Clear liquids day before surgery  -Magnesium citrate, drink by noon the day before surgery    Oral Hygiene is also important to reduce your risk of infection.                                    Remember - BRUSH YOUR TEETH THE MORNING OF SURGERY WITH YOUR REGULAR  TOOTHPASTE   Do NOT smoke after Midnight   Take these medicines the morning of surgery with A SIP OF WATER:   Finasteride  Solifenacin  How to Manage Your Diabetes Before and After Surgery  Why is it important to control my blood sugar before and after surgery? Improving blood sugar levels before and after surgery helps healing and can limit problems. A way of improving blood sugar  control is eating a healthy diet by:  Eating less sugar and carbohydrates  Increasing activity/exercise  Talking with your doctor about reaching your blood sugar goals High blood sugars (greater than 180 mg/dL) can raise your risk of infections and slow your recovery, so you will need to focus on controlling your diabetes during the weeks before surgery. Make sure that the doctor who takes care of your diabetes knows about your planned surgery including the date and location.  How do I manage my blood sugar before surgery? Check your blood sugar at least 4 times a day, starting 2 days before surgery, to make sure that the level is not too high or low. Check your blood sugar the morning of your surgery when you wake up and every 2 hours until you get to the Short Stay unit. If your blood sugar is less than 70 mg/dL, you will need to treat for low blood sugar: Do not take insulin. Treat a low blood sugar (less than 70 mg/dL) with  cup of clear juice (cranberry or apple), 4 glucose tablets, OR glucose gel. Recheck blood sugar in 15 minutes after treatment (to make sure it is greater than 70 mg/dL). If your blood sugar is not greater than 70 mg/dL on recheck, call [phone] for further instructions. Report your blood sugar to the short stay nurse when you get to Short Stay.  If you are admitted to the hospital after surgery: Your blood sugar will be checked by the staff and you will probably be given insulin after surgery (instead of oral diabetes medicines) to make sure you have good blood sugar levels. The  goal for blood sugar control after surgery is 80-180 mg/dL.   WHAT DO I DO ABOUT MY DIABETES MEDICATION?  Do not take oral diabetes medicines (pills) the morning of surgery.        Hold Jardiance 3 days before surgery  THE NIGHT BEFORE SURGERY:  Take 14 units of Insulin Glargine.      DO NOT TAKE THE FOLLOWING 7 DAYS PRIOR TO SURGERY: Ozempic, Wegovy, Rybelsus (Semaglutide), Byetta (exenatide), Bydureon (exenatide ER), Victoza, Saxenda (liraglutide), or Trulicity (dulaglutide) Mounjaro (Tirzepatide) Adlyxin (Lixisenatide), Polyethylene Glycol Loxenatide.  Reviewed and Endorsed by [facility] Patient Education Committee, August 2015                               You may not have any metal on your body including  jewelry, and body piercing             Do not wear lotions, powders, cologne, or deodorant              Men may shave face and neck.   Do not bring valuables to the hospital. [place].   Contacts, dentures or bridgework may not be worn into surgery.   Bring small overnight bag day of surgery.   DO NOT [place]. PHARMACY WILL DISPENSE MEDICATIONS LISTED ON YOUR MEDICATION LIST TO YOU DURING YOUR ADMISSION [place]!    Special Instructions: Bring a copy of your healthcare power of attorney and living will documents the day of surgery if you haven't scanned them before.  Please read over the following fact sheets you were given: IF YOU HAVE QUESTIONS ABOUT YOUR PRE-OP INSTRUCTIONS PLEASE CALL [place]  If you received a COVID test during your pre-op visit  it is requested that you wear a mask when out in public, stay away from anyone that may not be feeling well and notify your surgeon if you develop symptoms. If you test positive for Covid or have been in contact with anyone that has tested positive in the last 10 days please notify you surgeon.  [place] - Preparing for  Surgery Before surgery, you can play an important role.  Because skin is not sterile, your skin needs to be as free of germs as possible.  You can reduce the number of germs on your skin by washing with CHG (chlorahexidine gluconate) soap before surgery.  CHG is an antiseptic cleaner which kills germs and bonds with the skin to continue killing germs even after washing. Please DO NOT use if you have an allergy to CHG or antibacterial soaps.  If your skin becomes reddened/irritated stop using the CHG and inform your nurse when you arrive at Short Stay. Do not shave (including legs and underarms) for at least 48 hours prior to the first CHG shower.  You may shave your face/neck.  Please follow these instructions carefully:  1.  Shower with CHG Soap the night before surgery and the  morning of surgery.  2.  If you choose to wash your hair, wash your hair first as usual with your normal  shampoo.  3.  After you shampoo, rinse your hair and body thoroughly to remove the shampoo.                             4.  Use CHG as you would any other liquid soap.  You can apply chg directly to the skin and wash.  Gently with a scrungie or clean washcloth.  5.  Apply the CHG Soap to your body ONLY FROM THE NECK DOWN.   Do   not use on face/ open                           Wound or open sores. Avoid contact with eyes, ears mouth and   genitals (private parts).                       Wash face,  Genitals (private parts) with your normal soap.             6.  Wash thoroughly, paying special attention to the area where your    surgery  will be performed.  7.  Thoroughly rinse your body with warm water from the neck down.  8.  DO NOT shower/wash with your normal soap after using and rinsing off the CHG Soap.                9.  Pat yourself dry with a clean towel.            10.  Wear clean pajamas.            11.  Place clean sheets on your bed the night of your first shower and do not  sleep with pets. Day of Surgery  : Do not apply any lotions/deodorants the morning of surgery.  Please wear clean clothes to the hospital/surgery center.  FAILURE TO FOLLOW THESE INSTRUCTIONS MAY RESULT IN THE CANCELLATION OF YOUR SURGERY  PATIENT SIGNATURE_________________________________  NURSE SIGNATURE__________________________________  ________________________________________________________________________

## 2021-10-19 ENCOUNTER — Encounter (HOSPITAL_COMMUNITY): Payer: Self-pay

## 2021-10-19 ENCOUNTER — Encounter (HOSPITAL_COMMUNITY)
Admission: RE | Admit: 2021-10-19 | Discharge: 2021-10-19 | Disposition: A | Discharge status: Home / Self Care | Payer: Medicare Other | Source: Ambulatory Visit | Attending: Urology | Admitting: Urology

## 2021-10-19 ENCOUNTER — Other Ambulatory Visit: Payer: Self-pay

## 2021-10-19 VITALS — BP 128/77 | HR 85 | Temp 98.2°F | Resp 16 | Ht 65.0 in | Wt 173.0 lb

## 2021-10-19 DIAGNOSIS — E119 Type 2 diabetes mellitus without complications: Secondary | ICD-10-CM | POA: Insufficient documentation

## 2021-10-19 DIAGNOSIS — Z794 Long term (current) use of insulin: Secondary | ICD-10-CM | POA: Diagnosis not present

## 2021-10-19 DIAGNOSIS — I2510 Atherosclerotic heart disease of native coronary artery without angina pectoris: Secondary | ICD-10-CM | POA: Insufficient documentation

## 2021-10-19 DIAGNOSIS — Z01818 Encounter for other preprocedural examination: Secondary | ICD-10-CM | POA: Diagnosis present

## 2021-10-19 DIAGNOSIS — R9431 Abnormal electrocardiogram [ECG] [EKG]: Secondary | ICD-10-CM | POA: Insufficient documentation

## 2021-10-19 LAB — CBC
HCT: 49 % (ref 39.0–52.0)
Hemoglobin: 16.4 g/dL (ref 13.0–17.0)
MCH: 31.5 pg (ref 26.0–34.0)
MCHC: 33.5 g/dL (ref 30.0–36.0)
MCV: 94.2 fL (ref 80.0–100.0)
Platelets: 168 10*3/uL (ref 150–400)
RBC: 5.2 MIL/uL (ref 4.22–5.81)
RDW: 13.1 % (ref 11.5–15.5)
WBC: 6 10*3/uL (ref 4.0–10.5)
nRBC: 0 % (ref 0.0–0.2)

## 2021-10-19 LAB — BASIC METABOLIC PANEL
Anion gap: 8 (ref 5–15)
BUN: 18 mg/dL (ref 8–23)
CO2: 25 mmol/L (ref 22–32)
Calcium: 9.3 mg/dL (ref 8.9–10.3)
Chloride: 106 mmol/L (ref 98–111)
Creatinine, Ser: 0.71 mg/dL (ref 0.61–1.24)
GFR, Estimated: >60 mL/min (ref >60)
Glucose, Bld: 160 mg/dL — ABNORMAL HIGH (ref 70–99)
Potassium: 4.2 mmol/L (ref 3.5–5.1)
Sodium: 139 mmol/L (ref 135–145)

## 2021-10-19 LAB — GLUCOSE, CAPILLARY: Glucose-Capillary: 155 mg/dL — ABNORMAL HIGH (ref 70–99)

## 2021-10-19 NOTE — Progress Notes (Addendum)
Anesthesia Review:  PCP: 09/28/21- Kenneth Little LOV  Cardiologist  none  Endocrinologist- DR Kenneth Little  Chest x-ray : EKG : 10/19/21  Echo : Stress test: Cardiac Cath :  Activity level:  can do a flgiht of stairs without difficulty  Sleep Study/ CPAP : none  Fasting Blood Sugar :      / Checks Blood Sugar -- times a day:   Blood Thinner/ Instructions /Last Dose: ASA / Instructions/ Last Dose :   81 mg aspirin  DM- type2- checks glucose twice daily at home  Hgba1c- 09/28/21- 7.9 - routed to DR [facility] on 10/19/21

## 2021-10-28 NOTE — Anesthesia Preprocedure Evaluation (Addendum)
Anesthesia Evaluation  Patient identified by MRN, date of birth, ID band Patient awake    Reviewed: Allergy & Precautions, NPO status , Patient's Chart, lab work & pertinent test results  Airway Mallampati: III  TM Distance: >3 FB Neck ROM: Full    Dental no notable dental hx. (+) Teeth Intact, Dental Advisory Given   Pulmonary former smoker,  Quit smoking 1990, 67 pack year history    Pulmonary exam normal breath sounds clear to auscultation       Cardiovascular hypertension (149/70 in preop), Pt. on medications Normal cardiovascular exam Rhythm:Regular Rate:Normal     Neuro/Psych negative neurological ROS  negative psych ROS   GI/Hepatic negative GI ROS, Neg liver ROS,   Endo/Other  diabetes, Poorly Controlled, Type 2, Insulin Dependent, Oral Hypoglycemic Agentsa1c 7.9 FS 125 in preop  Renal/GU negative Renal ROS  negative genitourinary   Musculoskeletal negative musculoskeletal ROS (+)   Abdominal   Peds  Hematology negative hematology ROS (+) Hb 16.4, plt 168   Anesthesia Other Findings   Reproductive/Obstetrics negative OB ROS                            Anesthesia Physical Anesthesia Plan  ASA: 2  Anesthesia Plan: General   Post-op Pain Management: Tylenol PO (pre-op)*   Induction: Intravenous  PONV Risk Score and Plan: 3 and Ondansetron, Dexamethasone, Midazolam and Treatment may vary due to age or medical condition  Airway Management Planned: Oral ETT  Additional Equipment: None  Intra-op Plan:   Post-operative Plan: Extubation in OR  Informed Consent: I have reviewed the patients History and Physical, chart, labs and discussed the procedure including the risks, benefits and alternatives for the proposed anesthesia with the patient or authorized representative who has indicated his/her understanding and acceptance.     Dental advisory given  Plan Discussed with:  CRNA  Anesthesia Plan Comments:        Anesthesia Quick Evaluation

## 2021-10-29 ENCOUNTER — Other Ambulatory Visit: Payer: Self-pay

## 2021-10-29 ENCOUNTER — Encounter (HOSPITAL_COMMUNITY): Payer: Self-pay | Admitting: Urology

## 2021-10-29 ENCOUNTER — Ambulatory Visit (HOSPITAL_COMMUNITY): Payer: Medicare Other | Admitting: Anesthesiology

## 2021-10-29 ENCOUNTER — Observation Stay (HOSPITAL_COMMUNITY)
Admission: RE | Admit: 2021-10-29 | Discharge: 2021-10-31 | Disposition: A | Discharge status: Home / Self Care | Payer: Medicare Other | Source: Ambulatory Visit | Attending: Urology | Admitting: Urology

## 2021-10-29 ENCOUNTER — Ambulatory Visit (HOSPITAL_COMMUNITY): Payer: Medicare Other | Admitting: Physician Assistant

## 2021-10-29 ENCOUNTER — Encounter (HOSPITAL_COMMUNITY)
Admission: RE | Disposition: A | Discharge status: Home / Self Care | Payer: Self-pay | Source: Ambulatory Visit | Attending: Urology

## 2021-10-29 DIAGNOSIS — K660 Peritoneal adhesions (postprocedural) (postinfection): Secondary | ICD-10-CM

## 2021-10-29 DIAGNOSIS — D291 Benign neoplasm of prostate: Secondary | ICD-10-CM | POA: Diagnosis not present

## 2021-10-29 DIAGNOSIS — E119 Type 2 diabetes mellitus without complications: Secondary | ICD-10-CM | POA: Diagnosis not present

## 2021-10-29 DIAGNOSIS — Z7984 Long term (current) use of oral hypoglycemic drugs: Secondary | ICD-10-CM | POA: Diagnosis not present

## 2021-10-29 DIAGNOSIS — K4020 Bilateral inguinal hernia, without obstruction or gangrene, not specified as recurrent: Secondary | ICD-10-CM | POA: Insufficient documentation

## 2021-10-29 DIAGNOSIS — I10 Essential (primary) hypertension: Secondary | ICD-10-CM | POA: Insufficient documentation

## 2021-10-29 DIAGNOSIS — Z87891 Personal history of nicotine dependence: Secondary | ICD-10-CM | POA: Insufficient documentation

## 2021-10-29 DIAGNOSIS — N400 Enlarged prostate without lower urinary tract symptoms: Secondary | ICD-10-CM | POA: Diagnosis present

## 2021-10-29 DIAGNOSIS — Z79899 Other long term (current) drug therapy: Secondary | ICD-10-CM | POA: Diagnosis not present

## 2021-10-29 DIAGNOSIS — Z794 Long term (current) use of insulin: Secondary | ICD-10-CM | POA: Insufficient documentation

## 2021-10-29 DIAGNOSIS — N401 Enlarged prostate with lower urinary tract symptoms: Secondary | ICD-10-CM

## 2021-10-29 DIAGNOSIS — Z7982 Long term (current) use of aspirin: Secondary | ICD-10-CM | POA: Diagnosis not present

## 2021-10-29 HISTORY — PX: XI ROBOTIC ASSISTED SIMPLE PROSTATECTOMY: SHX6713

## 2021-10-29 LAB — TYPE AND SCREEN
ABO/RH(D): B POS
Antibody Screen: NEGATIVE

## 2021-10-29 LAB — GLUCOSE, CAPILLARY
Glucose-Capillary: 125 mg/dL — ABNORMAL HIGH (ref 70–99)
Glucose-Capillary: 175 mg/dL — ABNORMAL HIGH (ref 70–99)
Glucose-Capillary: 151 mg/dL — ABNORMAL HIGH (ref 70–99)
Glucose-Capillary: 129 mg/dL — ABNORMAL HIGH (ref 70–99)

## 2021-10-29 LAB — BASIC METABOLIC PANEL
Anion gap: 7 (ref 5–15)
BUN: 13 mg/dL (ref 8–23)
CO2: 23 mmol/L (ref 22–32)
Calcium: 8 mg/dL — ABNORMAL LOW (ref 8.9–10.3)
Chloride: 104 mmol/L (ref 98–111)
Creatinine, Ser: 0.74 mg/dL (ref 0.61–1.24)
GFR, Estimated: >60 mL/min (ref >60)
Glucose, Bld: 209 mg/dL — ABNORMAL HIGH (ref 70–99)
Potassium: 3.8 mmol/L (ref 3.5–5.1)
Sodium: 134 mmol/L — ABNORMAL LOW (ref 135–145)

## 2021-10-29 LAB — HEMOGLOBIN AND HEMATOCRIT, BLOOD
HCT: 45.6 % (ref 39.0–52.0)
Hemoglobin: 15.1 g/dL (ref 13.0–17.0)

## 2021-10-29 SURGERY — PROSTATECTOMY, SIMPLE, ROBOT-ASSISTED
Anesthesia: General

## 2021-10-29 MED ORDER — ONDANSETRON HCL 4 MG/2ML IJ SOLN: 4.0000 mg | INTRAMUSCULAR | Status: DC | PRN

## 2021-10-29 MED ORDER — AMISULPRIDE (ANTIEMETIC) 5 MG/2ML IV SOLN: 10.0000 mg | Freq: Once | INTRAVENOUS | Status: DC | PRN

## 2021-10-29 MED ORDER — ACETAMINOPHEN 500 MG PO TABS: 1000.0000 mg | ORAL_TABLET | Freq: Four times a day (QID) | ORAL | Status: DC

## 2021-10-29 MED ORDER — LACTATED RINGERS IV SOLN: INTRAVENOUS | Status: DC

## 2021-10-29 MED ORDER — CHLORHEXIDINE GLUCONATE 0.12 % MT SOLN
15.0000 mL | Freq: Once | OROMUCOSAL | Status: AC
  Administered 2021-10-29: 15 mL via OROMUCOSAL

## 2021-10-29 MED ORDER — LIDOCAINE HCL (PF) 2 % IJ SOLN
INTRAMUSCULAR | Status: AC
  Filled 2021-10-29: qty 5

## 2021-10-29 MED ORDER — INSULIN ASPART 100 UNIT/ML IJ SOLN: 0.0000 [IU] | Freq: Every day | INTRAMUSCULAR | Status: DC

## 2021-10-29 MED ORDER — HYDROCODONE-ACETAMINOPHEN 5-325 MG PO TABS: 1.0000 | ORAL_TABLET | Freq: Four times a day (QID) | ORAL | 0 refills | Status: DC | PRN

## 2021-10-29 MED ORDER — ROCURONIUM BROMIDE 10 MG/ML (PF) SYRINGE
PREFILLED_SYRINGE | INTRAVENOUS | Status: DC | PRN
  Administered 2021-10-29: 100 mg via INTRAVENOUS

## 2021-10-29 MED ORDER — ONDANSETRON HCL 4 MG/2ML IJ SOLN: 4.0000 mg | Freq: Once | INTRAMUSCULAR | Status: DC | PRN

## 2021-10-29 MED ORDER — SUGAMMADEX SODIUM 200 MG/2ML IV SOLN
INTRAVENOUS | Status: DC | PRN
  Administered 2021-10-29: 200 mg via INTRAVENOUS

## 2021-10-29 MED ORDER — MORPHINE SULFATE (PF) 2 MG/ML IV SOLN: 1.0000 mg | INTRAVENOUS | Status: DC | PRN

## 2021-10-29 MED ORDER — OXYCODONE HCL 5 MG/5ML PO SOLN: 5.0000 mg | Freq: Once | ORAL | Status: DC | PRN

## 2021-10-29 MED ORDER — PHENYLEPHRINE 80 MCG/ML (10ML) SYRINGE FOR IV PUSH (FOR BLOOD PRESSURE SUPPORT)
PREFILLED_SYRINGE | INTRAVENOUS | Status: AC
  Filled 2021-10-29: qty 10

## 2021-10-29 MED ORDER — PROPOFOL 10 MG/ML IV BOLUS
INTRAVENOUS | Status: DC | PRN
  Administered 2021-10-29: 100 mg via INTRAVENOUS
  Administered 2021-10-29: 30 mg via INTRAVENOUS

## 2021-10-29 MED ORDER — FENTANYL CITRATE (PF) 100 MCG/2ML IJ SOLN
INTRAMUSCULAR | Status: AC
  Filled 2021-10-29: qty 2

## 2021-10-29 MED ORDER — CEFAZOLIN SODIUM-DEXTROSE 2-4 GM/100ML-% IV SOLN
2.0000 g | INTRAVENOUS | Status: AC
  Administered 2021-10-29: 2 g via INTRAVENOUS
  Filled 2021-10-29: qty 100

## 2021-10-29 MED ORDER — DEXAMETHASONE SODIUM PHOSPHATE 10 MG/ML IJ SOLN
INTRAMUSCULAR | Status: DC | PRN
  Administered 2021-10-29: 5 mg via INTRAVENOUS

## 2021-10-29 MED ORDER — LACTATED RINGERS IR SOLN
Status: DC | PRN
  Administered 2021-10-29: 1000 mL

## 2021-10-29 MED ORDER — LIDOCAINE 2% (20 MG/ML) 5 ML SYRINGE
INTRAMUSCULAR | Status: DC | PRN
  Administered 2021-10-29: 60 mg via INTRAVENOUS

## 2021-10-29 MED ORDER — ROCURONIUM BROMIDE 10 MG/ML (PF) SYRINGE
PREFILLED_SYRINGE | INTRAVENOUS | Status: AC
  Filled 2021-10-29: qty 10

## 2021-10-29 MED ORDER — ONDANSETRON HCL 4 MG/2ML IJ SOLN
INTRAMUSCULAR | Status: AC
  Filled 2021-10-29: qty 2

## 2021-10-29 MED ORDER — DIPHENHYDRAMINE HCL 50 MG/ML IJ SOLN: 12.5000 mg | Freq: Four times a day (QID) | INTRAMUSCULAR | Status: DC | PRN

## 2021-10-29 MED ORDER — LACTATED RINGERS IV SOLN: INTRAVENOUS | Status: DC | PRN

## 2021-10-29 MED ORDER — SODIUM CHLORIDE (PF) 0.9 % IJ SOLN
INTRAMUSCULAR | Status: AC
  Filled 2021-10-29: qty 20

## 2021-10-29 MED ORDER — BUPIVACAINE LIPOSOME 1.3 % IJ SUSP
INTRAMUSCULAR | Status: DC | PRN
  Administered 2021-10-29: 20 mL

## 2021-10-29 MED ORDER — OXYCODONE HCL 5 MG PO TABS
5.0000 mg | ORAL_TABLET | ORAL | Status: DC | PRN
  Administered 2021-10-29: 5 mg via ORAL
  Filled 2021-10-29: qty 1

## 2021-10-29 MED ORDER — BUPIVACAINE LIPOSOME 1.3 % IJ SUSP
INTRAMUSCULAR | Status: AC
  Filled 2021-10-29: qty 20

## 2021-10-29 MED ORDER — INSULIN ASPART 100 UNIT/ML IJ SOLN
0.0000 [IU] | Freq: Three times a day (TID) | INTRAMUSCULAR | Status: DC
  Administered 2021-10-29 – 2021-10-31 (×5): 3 [IU] via SUBCUTANEOUS

## 2021-10-29 MED ORDER — ATORVASTATIN CALCIUM 40 MG PO TABS
40.0000 mg | ORAL_TABLET | Freq: Every evening | ORAL | Status: DC
  Administered 2021-10-29 – 2021-10-30 (×2): 40 mg via ORAL
  Filled 2021-10-29 (×3): qty 1

## 2021-10-29 MED ORDER — DEXAMETHASONE SODIUM PHOSPHATE 10 MG/ML IJ SOLN
INTRAMUSCULAR | Status: AC
  Filled 2021-10-29: qty 1

## 2021-10-29 MED ORDER — DOCUSATE SODIUM 100 MG PO CAPS: 100.0000 mg | ORAL_CAPSULE | Freq: Two times a day (BID) | ORAL | Status: DC

## 2021-10-29 MED ORDER — HYDROMORPHONE HCL 1 MG/ML IJ SOLN: 0.2500 mg | INTRAMUSCULAR | Status: DC | PRN

## 2021-10-29 MED ORDER — ACETAMINOPHEN 500 MG PO TABS
1000.0000 mg | ORAL_TABLET | Freq: Once | ORAL | Status: AC
  Administered 2021-10-29: 1000 mg via ORAL
  Filled 2021-10-29: qty 2

## 2021-10-29 MED ORDER — DIPHENHYDRAMINE HCL 12.5 MG/5ML PO ELIX: 12.5000 mg | ORAL_SOLUTION | Freq: Four times a day (QID) | ORAL | Status: DC | PRN

## 2021-10-29 MED ORDER — FENTANYL CITRATE (PF) 100 MCG/2ML IJ SOLN
INTRAMUSCULAR | Status: DC | PRN
  Administered 2021-10-29 (×4): 50 ug via INTRAVENOUS

## 2021-10-29 MED ORDER — MAGNESIUM CITRATE PO SOLN: 1.0000 | Freq: Once | ORAL | Status: DC

## 2021-10-29 MED ORDER — PROPOFOL 10 MG/ML IV BOLUS
INTRAVENOUS | Status: AC
  Filled 2021-10-29: qty 20

## 2021-10-29 MED ORDER — ORAL CARE MOUTH RINSE: 15.0000 mL | Freq: Once | OROMUCOSAL | Status: AC

## 2021-10-29 MED ORDER — OXYCODONE HCL 5 MG PO TABS: 10.0000 mg | ORAL_TABLET | ORAL | Status: DC | PRN

## 2021-10-29 MED ORDER — SULFAMETHOXAZOLE-TRIMETHOPRIM 800-160 MG PO TABS: 1.0000 | ORAL_TABLET | Freq: Two times a day (BID) | ORAL | 0 refills | Status: DC

## 2021-10-29 MED ORDER — ACETAMINOPHEN 500 MG PO TABS
1000.0000 mg | ORAL_TABLET | Freq: Four times a day (QID) | ORAL | Status: AC
  Administered 2021-10-29 – 2021-10-30 (×4): 1000 mg via ORAL
  Filled 2021-10-29 (×4): qty 2

## 2021-10-29 MED ORDER — OXYBUTYNIN CHLORIDE 5 MG PO TABS: 5.0000 mg | ORAL_TABLET | Freq: Three times a day (TID) | ORAL | Status: DC | PRN

## 2021-10-29 MED ORDER — SODIUM CHLORIDE (PF) 0.9 % IJ SOLN
INTRAMUSCULAR | Status: DC | PRN
  Administered 2021-10-29: 20 mL

## 2021-10-29 MED ORDER — PHENYLEPHRINE 80 MCG/ML (10ML) SYRINGE FOR IV PUSH (FOR BLOOD PRESSURE SUPPORT)
PREFILLED_SYRINGE | INTRAVENOUS | Status: DC | PRN
  Administered 2021-10-29 (×4): 80 ug via INTRAVENOUS

## 2021-10-29 MED ORDER — ONDANSETRON HCL 4 MG/2ML IJ SOLN
INTRAMUSCULAR | Status: DC | PRN
  Administered 2021-10-29: 4 mg via INTRAVENOUS

## 2021-10-29 MED ORDER — OXYCODONE HCL 5 MG PO TABS: 5.0000 mg | ORAL_TABLET | Freq: Once | ORAL | Status: DC | PRN

## 2021-10-29 SURGICAL SUPPLY — 68 items
APPLICATOR COTTON TIP 6 STRL (MISCELLANEOUS) ×2 IMPLANT
APPLICATOR COTTON TIP 6IN STRL (MISCELLANEOUS) ×2
BAG COUNTER SPONGE SURGICOUNT (BAG) IMPLANT
CATH FOLEY 2WAY SLVR 18FR 30CC (CATHETERS) ×2 IMPLANT
CATH FOLEY 3WAY 30CC 24FR (CATHETERS) ×2
CATH TIEMANN FOLEY 18FR 5CC (CATHETERS) IMPLANT
CATH URTH STD 24FR FL 3W 2 (CATHETERS) ×2 IMPLANT
CHLORAPREP W/TINT 26 (MISCELLANEOUS) ×2 IMPLANT
CLIP LIGATING HEM O LOK PURPLE (MISCELLANEOUS) IMPLANT
CLOTH BEACON ORANGE TIMEOUT ST (SAFETY) ×2 IMPLANT
COVER SURGICAL LIGHT HANDLE (MISCELLANEOUS) ×2 IMPLANT
COVER TIP SHEARS 8 DVNC (MISCELLANEOUS) ×2 IMPLANT
COVER TIP SHEARS 8MM DA VINCI (MISCELLANEOUS) ×2
CUTTER ECHEON FLEX ENDO 45 340 (ENDOMECHANICALS) IMPLANT
DERMABOND ADVANCED .7 DNX12 (GAUZE/BANDAGES/DRESSINGS) ×2 IMPLANT
DRAIN CHANNEL RND F F (WOUND CARE) IMPLANT
DRAPE ARM DVNC X/XI (DISPOSABLE) ×8 IMPLANT
DRAPE COLUMN DVNC XI (DISPOSABLE) ×2 IMPLANT
DRAPE DA VINCI XI ARM (DISPOSABLE) ×8
DRAPE DA VINCI XI COLUMN (DISPOSABLE) ×2
DRAPE SURG IRRIG POUCH 19X23 (DRAPES) ×2 IMPLANT
DRSG TEGADERM 4X4.75 (GAUZE/BANDAGES/DRESSINGS) ×2 IMPLANT
ELECT PENCIL ROCKER SW 15FT (MISCELLANEOUS) ×2 IMPLANT
ELECT REM PT RETURN 15FT ADLT (MISCELLANEOUS) ×2 IMPLANT
GAUZE SPONGE 4X4 12PLY STRL (GAUZE/BANDAGES/DRESSINGS) IMPLANT
GLOVE BIO SURGEON STRL SZ 6.5 (GLOVE) ×2 IMPLANT
GLOVE BIOGEL PI IND STRL 7.5 (GLOVE) ×2 IMPLANT
GLOVE SURG LX STRL 7.5 STRW (GLOVE) ×4 IMPLANT
GOWN SRG XL LVL 4 BRTHBL STRL (GOWNS) ×2 IMPLANT
GOWN STRL NON-REIN XL LVL4 (GOWNS) ×2
GOWN STRL REUS W/ TWL XL LVL3 (GOWN DISPOSABLE) ×4 IMPLANT
GOWN STRL REUS W/TWL XL LVL3 (GOWN DISPOSABLE) ×4
HOLDER FOLEY CATH W/STRAP (MISCELLANEOUS) ×2 IMPLANT
IRRIG SUCT STRYKERFLOW 2 WTIP (MISCELLANEOUS) ×2
IRRIGATION SUCT STRKRFLW 2 WTP (MISCELLANEOUS) ×2 IMPLANT
IV LACTATED RINGERS 1000ML (IV SOLUTION) ×2 IMPLANT
KIT TURNOVER KIT A (KITS) IMPLANT
NDL INSUFFLATION 14GA 120MM (NEEDLE) ×2 IMPLANT
NEEDLE INSUFFLATION 14GA 120MM (NEEDLE) ×2 IMPLANT
PACK ROBOT UROLOGY CUSTOM (CUSTOM PROCEDURE TRAY) ×2 IMPLANT
PAD POSITIONING PINK XL (MISCELLANEOUS) ×2 IMPLANT
PORT ACCESS TROCAR AIRSEAL 12 (TROCAR) ×2 IMPLANT
PORT ACCESS TROCAR AIRSEAL 5M (TROCAR) ×2
RELOAD STAPLE 45 4.1 GRN THCK (STAPLE) IMPLANT
SEAL CANN UNIV 5-8 DVNC XI (MISCELLANEOUS) ×8 IMPLANT
SEAL XI 5MM-8MM UNIVERSAL (MISCELLANEOUS) ×8
SET TRI-LUMEN FLTR TB AIRSEAL (TUBING) ×2 IMPLANT
SOLUTION ELECTROLUBE (MISCELLANEOUS) ×2 IMPLANT
SPIKE FLUID TRANSFER (MISCELLANEOUS) ×2 IMPLANT
SPONGE T-LAP 4X18 ~~LOC~~+RFID (SPONGE) ×2 IMPLANT
STAPLE RELOAD 45 GRN (STAPLE) IMPLANT
STAPLE RELOAD 45MM GREEN (STAPLE)
SUT ETHIBOND 0 (SUTURE) IMPLANT
SUT ETHILON 3 0 PS 1 (SUTURE) ×2 IMPLANT
SUT MNCRL AB 4-0 PS2 18 (SUTURE) ×4 IMPLANT
SUT PDS AB 1 CT1 27 (SUTURE) ×4 IMPLANT
SUT VIC AB 0 CT1 27 (SUTURE) ×8
SUT VIC AB 0 CT1 27XBRD ANTBC (SUTURE) ×6 IMPLANT
SUT VIC AB 2-0 SH 27 (SUTURE) ×2
SUT VIC AB 2-0 SH 27X BRD (SUTURE) ×2 IMPLANT
SUT VICRYL 0 UR6 27IN ABS (SUTURE) ×2 IMPLANT
SUT VLOC 180 2-0 9IN GS21 (SUTURE) IMPLANT
SUT VLOC 3-0 9IN GRN (SUTURE) IMPLANT
SYS BAG RETRIEVAL 10MM (BASKET) ×2
SYSTEM BAG RETRIEVAL 10MM (BASKET) ×2 IMPLANT
TROCAR Z-THREAD FIOS 5X100MM (TROCAR) IMPLANT
TROCAR Z-THREAD OPTICAL 5X100M (TROCAR) IMPLANT
WATER STERILE IRR 1000ML POUR (IV SOLUTION) ×2 IMPLANT

## 2021-10-29 NOTE — Brief Op Note (Signed)
10/29/2021  10:14 AM  PATIENT:  Kenneth Little  76 y.o. male  PRE-OPERATIVE DIAGNOSIS:  MASSIVE PROSTATE  POST-OPERATIVE DIAGNOSIS:  MASSIVE PROSTATE  PROCEDURE:  Procedure(s) with comments: XI ROBOTIC ASSISTED SIMPLE PROSTATECTOMY (N/A) - 3 HRS XI ROBOTIC ASSISTED BILATERAL INGUINAL HERNIA REPAIR (Bilateral)  SURGEON:  Surgeon(s) and Role:    * Alexis Frock, MD - Primary  PHYSICIAN ASSISTANT:   ASSISTANTS: Debbrah Alar PA  ANESTHESIA:   local and general  EBL:  minimal   BLOOD ADMINISTERED:none  DRAINS:  1 - JP to bulb; 2 - Foley to gravity (irrigation port plugged)    LOCAL MEDICATIONS USED:  MARCAINE     SPECIMEN:  Source of Specimen:  prostate adenoma  DISPOSITION OF SPECIMEN:  PATHOLOGY  COUNTS:  YES  TOURNIQUET:  * No tourniquets in log *  DICTATION: .Other Dictation: Dictation Number [id number]  PLAN OF CARE: Admit for overnight observation  PATIENT DISPOSITION:  PACU - hemodynamically stable.   Delay start of Pharmacological VTE agent (>24hrs) due to surgical blood loss or risk of bleeding: yes

## 2021-10-29 NOTE — H&P (Signed)
Kenneth Little is an 76 y.o. male.    Chief Complaint: Pre-Op Simple Prostatectomy  HPI:   1- Massive Prostate - Long h/o obstructive and irritative symptoms on alpha blocker + vesicare + finasteide. Prostate Vol 195gm by CT ellipsoid calculation 2023, mostly lateral lobe hypertrophy, minimal medain. PVR <169m. Recent cysto w/o strictures. Also gets occasional gross hematurai liekl prostate source.   PMH sig for IDDM2 (follows Dr. [facility]), open appy, open Rt inguinal hernia as child. Mostly retired from [employer] from [employer] [place] with advanced ALZ and does not live at home. He enjoys backetball, yardwork, and his grandchildren who live nearby. His PCP is Dr. ADeniece Ree   Today "Kenneth Little " (pronounced Chee) is seen to proceed with simple prostatectomy for med-refradotry lower urinary tract symptoms. No interva fevers. Hgb 16. Most recent UA without infectious parameters.   Past Medical History:  Diagnosis Date   Diabetes mellitus without complication ([place]    Enlarged prostate    Hyperlipidemia    Hypertension    UTI (lower urinary tract infection)     Past Surgical History:  Procedure Laterality Date   CHOLECYSTECTOMY N/A 12/24/2012   Procedure: LAPAROSCOPIC CHOLECYSTECTOMY WITH INTRAOPERATIVE CHOLANGIOGRAM;  Surgeon: DShann Medal MD;  Location: [facility];  Service: General;  Laterality: N/A;   CYSTOSCOPY WITH BIOPSY N/A 03/22/2016   Procedure: CYSTOSCOPY WITH BLADDER  BIOPSY WITH FULGURATION;  Surgeon: JIrine Seal MD;  [place] [facility]  Service: Urology;  Laterality: N/A;   diverticulitis     ERCP N/A 12/23/2012   Procedure: ENDOSCOPIC RETROGRADE CHOLANGIOPANCREATOGRAPHY (ERCP);  Surgeon: MJeryl Columbia MD;  Location: [facility];  Service: Endoscopy;  Laterality: N/A;   HEMORRHOID SURGERY  03/20/12   internal   HERNIA REPAIR Bilateral    childhood    Family History  Problem Relation Age of Onset   Cancer Mother         breast   Hypertension Father    Diabetes Brother    Social History:  reports that he quit smoking about 32 years ago. His smoking use included cigarettes. He has a 44.00 pack-year smoking history. He has never used smokeless tobacco. He reports that he does not drink alcohol and does not use drugs.  Allergies:  Allergies  Allergen Reactions   Ace Inhibitors Cough and Itching   Hydrochlorothiazide Cough and Itching    Medications Prior to Admission  Medication Sig Dispense Refill   alfuzosin (UROXATRAL) 10 MG 24 hr tablet Take 10 mg by mouth every evening.     aspirin EC 81 MG tablet Take 81 mg by mouth daily. Swallow whole.     atorvastatin (LIPITOR) 40 MG tablet Take 1 tablet (40 mg total) by mouth every evening. 90 tablet 5   Calcium Carbonate-Vitamin D 600-5 MG-MCG TABS Take 1 tablet by mouth every morning.     Cyanocobalamin (B-12 PO) Take 1 tablet by mouth daily.     finasteride (PROSCAR) 5 MG tablet Take 5 mg by mouth daily.     insulin glargine (LANTUS) 100 UNIT/ML injection Inject 28 Units into the skin daily before supper.     JARDIANCE 25 MG TABS tablet Take 25 mg by mouth daily.     metFORMIN (GLUCOPHAGE) 1000 MG tablet Take 1,000 mg by mouth 2 (two) times daily before a meal.     olmesartan (BENICAR) 40 MG tablet Take 1 tablet (40 mg total) by mouth daily. 90 tablet  4   oxybutynin (DITROPAN-XL) 5 MG 24 hr tablet Take 1 tablet (5 mg total) by mouth at bedtime. 60 tablet 4   pioglitazone (ACTOS) 45 MG tablet Take 1 tablet (45 mg total) by mouth every morning. (Patient taking differently: Take 22.5 mg by mouth every morning.) 90 tablet 5   sitaGLIPtin (JANUVIA) 100 MG tablet Take 1 tablet (100 mg total) by mouth every evening. 90 tablet 5   solifenacin (VESICARE) 5 MG tablet Take 5 mg by mouth daily.     TURMERIC PO Take 1 capsule by mouth daily.     Vitamin D, Ergocalciferol, (DRISDOL) 50000 units CAPS capsule Take 50,000 Units by mouth every 7 (seven) days.      ONETOUCH VERIO test strip       No results found for this or any previous visit (from the past 48 hour(s)). No results found.  Review of Systems  Constitutional:  Negative for chills and fever.  Genitourinary:  Positive for urgency.  All other systems reviewed and are negative.   Height '5\' 5"'$  (1.651 m), weight 78.5 kg. Physical Exam Vitals reviewed.  Constitutional:      Comments: Very spry for age  HENT:     Mouth/Throat:     Mouth: Mucous membranes are moist.  Eyes:     Pupils: Pupils are equal, round, and reactive to light.  Cardiovascular:     Rate and Rhythm: Normal rate.  Pulmonary:     Effort: Pulmonary effort is normal.  Abdominal:     General: Abdomen is flat.  Genitourinary:    Comments: No CVAT at present Musculoskeletal:        General: Normal range of motion.  Skin:    General: Skin is warm.  Neurological:     General: No focal deficit present.     Mental Status: He is alert.  Psychiatric:        Mood and Affect: Mood normal.      Assessment/Plan  Proceed as planned with robotic simple prostatectomy. Risks, benefits, alternatives, expected peri-op course discussed previously and reiterated today.   Alexis Frock, MD 10/29/2021, 5:46 AM

## 2021-10-29 NOTE — Op Note (Unsigned)
NAME: Warmuth, Lexx W. 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  PROCEDURES:   1.  Robotic-assisted laparoscopic simple prostatectomy. 2.  Laparoscopic bilateral inguinal hernia repair.  ESTIMATED BLOOD LOSS:  562 mL  COMPLICATIONS:  None.  SPECIMEN:  Prostate adenoma for permanent pathology.  ASSISTANT:  Debbrah Alar, PA  FINDINGS:   1.  Large bilobar prostatic hypertrophy as anticipated. 2.  Significant bilateral indirect inguinal hernias fascial defect approximately 2.5 cm pre-repair. 3.  Omental adhesions in the pelvis, these were simple.  DRAINS:   1.  Jackson-Pratt drain to bulb suction. 2.  Foley catheter to straight drain 3-way type irrigation port plugged.  INDICATIONS:  The patient is a very pleasant 76 year old [place] American man with longstanding history of obstructive voiding symptoms secondary to biopsy proven benign prostatic hypertrophy.  He has been on medical therapy for years and done quite  well; however, his symptoms have been slowly progressing.  He is now on maximum medical therapy with alpha blockers and 5-alpha reductase inhibitors and anticholinergics, but his voiding symptoms are becoming more bothersome.  Fortunately, he has not had  frank retention.  Options were discussed for further management including continued care versus outlet procedures and his prostate volume is nearly 200 grams and we discussed how the procedure of choice in this setting would be simple prostatectomy and  he adamantly wished to proceed.  He presents for this today.  Informed consent was obtained and  placed in medical record.  PROCEDURE IN DETAIL:  The patient is being identified and verified, procedure being robotic simple prostatectomy was confirmed, timeout was performed.  Intravenous antibiotics were administered.  General endotracheal anesthesia induced.  The patient was  placed into a low lithotomy position.  Sterile field was created, prepped and draped the patient's penis, perineum, and proximal thighs using iodine and his infra-xiphoid abdomen using chlorhexidine gluconate.  He was further fastened to operating table  using 3-inch tape over foam padding across supraxiphoid chest.  His arms were tucked to the side with gel rolls.  Test of steep Trendelenburg positioning was performed and found to be suitably positioned.  Foley catheter was placed free to straight  drain.  Next, a high flow, low pressure, pneumoperitoneum was obtained using Veress technique in the supraumbilical midline having passed the aspiration and drop test.  An 8 mm robotic camera port was then placed in same location.  Laparoscopic  examination of peritoneal cavity revealed some loose adhesions mostly omental in the right lower quadrant and some loose sigmoid adhesions, left lower quadrant.  Additional ports were placed as follows:  Right paramedian 8 mm robotic port, right far  lateral 12 mm AirSeal assist port, right paramedian 5 mm suction port, left paramedian 8 mm robotic port, left far lateral 8 mm robotic port.  Robot was docked and passed the electronic checks.  Initial attention was directed at limited adhesiolysis,  mostly loose adhesions in the area of the right lower quadrant. Omentum were taken down via the anterior abdominal wall, mostly towards the area of the internal rings.  It was felt that these likely represented prior adhesions from prior known right  lower quadrant surgery.  He also had some loose  sigmoid adhesions in left lower quadrant, which were taken down, allowing the sigmoid to fall more  medially and giving a much less obstructed due to the pelvis.  Attention was directed at development of  space of Retzius.  Incision was made lateral to the right median umbilical ligament from the midline towards the area of the internal ring coursing along the iliac vessels towards the area of the right ureter.  The vas deferens was encountered.  It was  not ligated.  A mirror image dissection was performed in the left side.  Anterior attachment was taken down with cautery scissors to expose the anterior base of the prostate, which was defatted to better denote the bladder neck prostate junction.  With  development of space of Retzius patient was clearly noted to have significant bilateral indirect inguinal hernias on each side, each fascial defect was approximately 2.5 cm.  I was quite concerned about the possibility of future bowel obstruction.  It  was clearly felt that a simple repair would be indicated concomitantly later in the surgery.  Attention was redirected back at the bladder neck, which was then identified and an inverted U cystotomy was made approximately 2 cm proximal to the area of the  true bladder neck for approximately 50% of the bladder circumference.  This allowed excellent visualization of the large prostate adenoma and trigone ridge.  Ureteral orifices were clearly visualized.  Next, 4 stay sutures of 0 Vicryl were placed, two  in each lobe to provide traction on the adenoma. Posterior sutures were grasped with superior, anterior and the posterior adenoma plane was entered approximately 1.5 cm distal to the trigone ridge.  The true adenoma plane was entered posteriorly at the 6  o'clock position and then carried towards the area of the apex of the prostate.  This was then carefully spread laterally to the 3 o'clock and 9 o'clock positions respectively.  Next, the lateral periprostatic mucosal incision was made circumferentially  keeping an excellent amount of prostate capsule intact  and the adenoma was further manipulated allowing access to the anterior and lateral aspects, which were carefully dissected away from the prostate capsule towards the area of the apex of the  prostate.  Progressively, the prostate needle was placed on more superior traction.  Final apical dissection was performed in the anterior plane by performing a butterfly incision into the anterior commissure of the adenoma.  This allowed excellent  visualization of the true apex, which was then released from the membranous urethra.  This completely freed up the large. Adenoma specimen was placed in EndoCatch bag for later retrieval.  Digital rectal exam was performed using indicator glove and no  evidence of rectal violation was noted.  There was no obvious capsular interruptions.  Additional hemostasis was achieved with point coagulation current resulted in excellent hemostasis of the prostate fossa. A mucosal advancement was then performed  using double-arm 3-0 V-Loc suture, essentially performing a 50% circumference. Anastomosis of the membranous urethra into the bladder neck mucosa on his posterior aspect from the 3 o'clock and 9 o'clock positions.  This resulted in excellent mucosal  bridge across the prostate fossa and also resulted in excellent hemostasis of the posterior plane.  Ureteral orifices were again visualized and were widely patent of copious urine.  The cystotomy was then reapproximated and closed using two separate  running suture lines of 2-0 V-Loc meeting in the 12 o'clock position and tied to each other.  A new 3-way a large bore catheter  was then placed per urethra, 20 mL water in the balloon.  This irrigated quantitatively and it was connected to straight drain irrigation port  plugged.  Pelvis was once again inspected and the area of the indirect hernia defect was visualized again and was concerned about the future, possible bowel strangulation especially of the small bowel given the diameter of  the defects and was felt that a  simple interrupted repair would be warranted.  As such, 0-Ethibond was used in figure-of-eight fashion from the pectinate line of the pubic ramus to the true fascia at the internal ring.  This was performed in figure-of-eight fashion on each side and  this completely resolved the patient's indirect hernia defect.  Sponge and needle counts were correct.  Hemostasis was excellent.  We achieved the goals of the surgery today.  Close suction drain was brought out the previous left lateral most robotic  site into the peritoneal cavity.  Robot was then undocked.  The previous right lateral most assistant port site was closed with fascia using Carter-Thomason suture passer and 0 Vicryl.  Specimen was retrieved by extending the previous camera port site  superiorly and inferiorly for a total distance of approximately 5 cm, removing the large adenoma specimen setting aside for permanent pathology.  Extraction site was closed with fascia using figure-of-eight PDS x4 followed by reapproximation of Scarpa's  with running Vicryl.  All incision sites were infiltrated with dilute lipolyzed Marcaine and closed at the level of the skin using subcuticular Monocryl and Dermabond.  Procedure was terminated.  Please note, first assistant, Debbrah Alar, was crucial for all portions of the surgery today.  She provided invaluable retraction, suctioning, specimen manipulation, robotic instrument exchange and general first assistance.   PUS D: 10/29/2021 10:24:54 am T: 10/29/2021 1:36:00 pm  JOB: [id number]/ [id number]

## 2021-10-29 NOTE — Discharge Instructions (Addendum)

## 2021-10-29 NOTE — Transfer of Care (Signed)
Immediate Anesthesia Transfer of Care Note  Patient: Kenneth Little  Procedure(s) Performed: XI ROBOTIC ASSISTED SIMPLE PROSTATECTOMY XI ROBOTIC ASSISTED BILATERAL INGUINAL HERNIA REPAIR (Bilateral)  Patient Location: PACU  Anesthesia Type:General  Level of Consciousness: sedated  Airway & Oxygen Therapy: Patient Spontanous Breathing and Patient connected to face mask oxygen  Post-op Assessment: Report given to RN and Post -op Vital signs reviewed and stable  Post vital signs: Reviewed and stable  Last Vitals:  Vitals Value Taken Time  BP    Temp    Pulse 81 10/29/21 1025  Resp 15 10/29/21 1025  SpO2 99 % 10/29/21 1025  Vitals shown include unvalidated device data.  Last Pain:  Vitals:   10/29/21 0547  TempSrc: Oral         Complications: No notable events documented.

## 2021-10-29 NOTE — Anesthesia Procedure Notes (Signed)
Procedure Name: Intubation Date/Time: 10/29/2021 7:24 AM  Performed by: Lind Covert, CRNAPre-anesthesia Checklist: Patient identified, Emergency Drugs available, Suction available, Patient being monitored and Timeout performed Patient Re-evaluated:Patient Re-evaluated prior to induction Oxygen Delivery Method: Circle system utilized Preoxygenation: Pre-oxygenation with 100% oxygen Induction Type: IV induction Ventilation: Mask ventilation without difficulty Laryngoscope Size: Mac and 4 Grade View: Grade I Tube type: Oral Tube size: 7.5 mm Number of attempts: 1 Airway Equipment and Method: Stylet Placement Confirmation: ETT inserted through vocal cords under direct vision and positive ETCO2 Secured at: 21 cm Tube secured with: Tape Dental Injury: Teeth and Oropharynx as per pre-operative assessment

## 2021-10-29 NOTE — Anesthesia Postprocedure Evaluation (Signed)
Anesthesia Post Note  Patient: Kenneth Little  Procedure(s) Performed: XI ROBOTIC ASSISTED SIMPLE PROSTATECTOMY XI ROBOTIC ASSISTED BILATERAL INGUINAL HERNIA REPAIR (Bilateral)     Patient location during evaluation: PACU Anesthesia Type: General Level of consciousness: awake and alert, oriented and patient cooperative Pain management: pain level controlled Vital Signs Assessment: post-procedure vital signs reviewed and stable Respiratory status: spontaneous breathing, nonlabored ventilation and respiratory function stable Cardiovascular status: blood pressure returned to baseline and stable Postop Assessment: no apparent nausea or vomiting Anesthetic complications: no   No notable events documented.  Last Vitals:  Vitals:   10/29/21 1100 10/29/21 1115  BP: (!) 157/80 (!) 157/81  Pulse: 84 82  Resp: 15 17  Temp:    SpO2: 93% 90%    Last Pain:  Vitals:   10/29/21 1100  TempSrc:   PainSc: 0-No pain                 Pervis Hocking

## 2021-10-30 ENCOUNTER — Encounter (HOSPITAL_COMMUNITY): Payer: Self-pay | Admitting: Urology

## 2021-10-30 DIAGNOSIS — N401 Enlarged prostate with lower urinary tract symptoms: Secondary | ICD-10-CM | POA: Diagnosis not present

## 2021-10-30 DIAGNOSIS — N400 Enlarged prostate without lower urinary tract symptoms: Secondary | ICD-10-CM | POA: Diagnosis present

## 2021-10-30 LAB — BASIC METABOLIC PANEL
Anion gap: 7 (ref 5–15)
BUN: 12 mg/dL (ref 8–23)
CO2: 24 mmol/L (ref 22–32)
Calcium: 8 mg/dL — ABNORMAL LOW (ref 8.9–10.3)
Chloride: 105 mmol/L (ref 98–111)
Creatinine, Ser: 0.85 mg/dL (ref 0.61–1.24)
GFR, Estimated: >60 mL/min (ref >60)
Glucose, Bld: 132 mg/dL — ABNORMAL HIGH (ref 70–99)
Potassium: 3.7 mmol/L (ref 3.5–5.1)
Sodium: 136 mmol/L (ref 135–145)

## 2021-10-30 LAB — HEMOGLOBIN AND HEMATOCRIT, BLOOD
HCT: 39.2 % (ref 39.0–52.0)
Hemoglobin: 13.2 g/dL (ref 13.0–17.0)

## 2021-10-30 LAB — GLUCOSE, CAPILLARY
Glucose-Capillary: 114 mg/dL — ABNORMAL HIGH (ref 70–99)
Glucose-Capillary: 185 mg/dL — ABNORMAL HIGH (ref 70–99)
Glucose-Capillary: 186 mg/dL — ABNORMAL HIGH (ref 70–99)
Glucose-Capillary: 186 mg/dL — ABNORMAL HIGH (ref 70–99)

## 2021-10-30 LAB — CREATININE, FLUID (PLEURAL, PERITONEAL, JP DRAINAGE): Creat, Fluid: 0.8 mg/dL

## 2021-10-30 NOTE — Care Management CC44 (Signed)
Condition Code 44 Documentation Completed  Patient Details  Name: Kenneth Little MRN: [id number] Date of Birth: 1945-09-20   Condition Code 44 given:  Yes Patient signature on Condition Code 44 notice:  Yes Documentation of 2 MD's agreement:  Yes Code 44 added to claim:  Yes    Ross Ludwig, LCSW 10/30/2021, 3:52 PM

## 2021-10-30 NOTE — Progress Notes (Signed)
1 Day Post-Op  Subjective: Kenneth Little is doing well POD #1 from a robotic simple prostatectomy.  He has no pain or nausea.  He has had some flatus.  He has some urgency.  The urine remains bloody without clots.  His Hgb is down to 13.2 from 15.1 yesterday postop.  ROS:  Review of Systems  Constitutional:  Negative for chills and fever.  Gastrointestinal:  Negative for abdominal pain, nausea and vomiting.  Genitourinary:  Positive for urgency.  All other systems reviewed and are negative.   Anti-infectives: Anti-infectives (From admission, onward)    Start     Dose/Rate Route Frequency Ordered Stop   10/29/21 0529  ceFAZolin (ANCEF) IVPB 2g/100 mL premix        2 g 200 mL/hr over 30 Minutes Intravenous 30 min pre-op 10/29/21 0529 10/29/21 0725   10/29/21 0000  sulfamethoxazole-trimethoprim (BACTRIM DS) 800-160 MG tablet        1 tablet Oral 2 times daily 10/29/21 1019         Current Facility-Administered Medications  Medication Dose Route Frequency Provider Last Rate Last Admin   atorvastatin (LIPITOR) tablet 40 mg  40 mg Oral QPM Abimbola, Obafunbi, MD   40 mg at 10/29/21 1724   diphenhydrAMINE (BENADRYL) injection 12.5 mg  12.5 mg Intravenous Q6H PRN Josph Macho, MD       Or   diphenhydrAMINE (BENADRYL) 12.5 MG/5ML elixir 12.5 mg  12.5 mg Oral Q6H PRN Abimbola, Obafunbi, MD       insulin aspart (novoLOG) injection 0-15 Units  0-15 Units Subcutaneous TID WC Josph Macho, MD   3 Units at 10/29/21 1724   insulin aspart (novoLOG) injection 0-5 Units  0-5 Units Subcutaneous QHS Josph Macho, MD       lactated ringers infusion   Intravenous Continuous Josph Macho, MD 100 mL/hr at 10/30/21 0318 New Bag at 10/30/21 0318   morphine (PF) 2 MG/ML injection 1 mg  1 mg Intravenous Q2H PRN Abimbola, Obafunbi, MD       ondansetron (ZOFRAN) injection 4 mg  4 mg Intravenous Q4H PRN Kathrynn Ducking, Obafunbi, MD       oxybutynin (DITROPAN) tablet 5 mg  5 mg Oral Q8H PRN Kathrynn Ducking,  Obafunbi, MD       oxyCODONE (Oxy IR/ROXICODONE) immediate release tablet 5 mg  5 mg Oral Q4H PRN Josph Macho, MD   5 mg at 10/29/21 1417   Or   oxyCODONE (Oxy IR/ROXICODONE) immediate release tablet 10 mg  10 mg Oral Q4H PRN Josph Macho, MD         Objective: Vital signs in last 24 hours: Temp:  [97.9 F (36.6 C)-98.9 F (37.2 C)] 98.5 F (36.9 C) (10/21 0747) Pulse Rate:  [79-91] 88 (10/21 0747) Resp:  [14-20] 18 (10/21 0747) BP: (130-171)/(69-83) 130/79 (10/21 0747) SpO2:  [87 %-100 %] 93 % (10/21 0747) Weight:  [81.4 kg] 81.4 kg (10/21 0529)  Intake/Output from previous day: 10/20 0701 - 10/21 0700 In: 5376.7 [P.O.:1800; I.V.:3576.7] Out: 3570 [Urine:3250; Drains:320] Intake/Output this shift: No intake/output data recorded.   Physical Exam Vitals reviewed.  Constitutional:      Appearance: Normal appearance.  Cardiovascular:     Rate and Rhythm: Normal rate and regular rhythm.     Heart sounds: Normal heart sounds.  Pulmonary:     Effort: Pulmonary effort is normal. No respiratory distress.     Breath sounds: Normal breath sounds.  Abdominal:     General: Bowel sounds are normal.  Palpations: Abdomen is soft.     Tenderness: There is no abdominal tenderness.     Comments: Incisions intact without erythema.  JP with moderate bloody drainage.   Neurological:     Mental Status: He is alert.     Lab Results:  Recent Labs    10/29/21 1105 10/30/21 0454  HGB 15.1 13.2  HCT 45.6 39.2   BMET Recent Labs    10/29/21 1105 10/30/21 0454  NA 134* 136  K 3.8 3.7  CL 104 105  CO2 23 24  GLUCOSE 209* 132*  BUN 13 12  CREATININE 0.74 0.85  CALCIUM 8.0* 8.0*   PT/INR No results for input(s): "LABPROT", "INR" in the last 72 hours. ABG No results for input(s): "PHART", "HCO3" in the last 72 hours.  Invalid input(s): "PCO2", "PO2"  Studies/Results: No results found.   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    His urine remains bloody but he is otherwise doing well.  Check drain fluid Cr today and d/c if serum.  Repeat labs in AM.  Will plan for D/C when urine is more clear.       LOS: 1 day    Irine Seal 10/21/2023Patient ID: Kenneth Little, male   DOB: Jun 30, 1945, 76 y.o.   MRN: [id number]

## 2021-10-30 NOTE — Care Management Obs Status (Signed)
[facility] NOTIFICATION   Patient Details  Name: Kenneth Little MRN: [id number] Date of Birth: [birth year]   Medicare Observation Status Notification Given:  Yes    Cecil Cobbs 10/30/2021, 3:52 PM

## 2021-10-30 NOTE — Clinical Social Work Note (Signed)
  [facility]) Screening Note   Patient Details  Name: Kenneth Little Date of Birth: 1945-04-01   [facility]) CM/SW Contact:    Ross Ludwig, LCSW Phone Number: 10/30/2021, 3:56 PM    Transition of Care Department [facility]) has reviewed patient and no TOC needs have been identified at this time. We will continue to monitor patient advancement through interdisciplinary progression rounds. If new patient transition needs arise, please place a TOC consult.

## 2021-10-30 NOTE — Plan of Care (Signed)
  Problem: Education: Goal: Knowledge of General Education information will improve Description: Including pain rating scale, medication(s)/side effects and non-pharmacologic comfort measures Outcome: Progressing   Problem: Activity: Goal: Risk for activity intolerance will decrease Outcome: Progressing   Problem: Coping: Goal: Level of anxiety will decrease Outcome: Progressing   Problem: Elimination: Goal: Will not experience complications related to urinary retention Outcome: Progressing   Problem: Pain Managment: Goal: General experience of comfort will improve Outcome: Progressing   Problem: Safety: Goal: Ability to remain free from injury will improve Outcome: Progressing   Problem: Skin Integrity: Goal: Risk for impaired skin integrity will decrease Outcome: Progressing   Problem: Pain Management: Goal: General experience of comfort will improve Outcome: Progressing

## 2021-10-31 DIAGNOSIS — N401 Enlarged prostate with lower urinary tract symptoms: Secondary | ICD-10-CM | POA: Diagnosis not present

## 2021-10-31 LAB — BASIC METABOLIC PANEL
Anion gap: 6 (ref 5–15)
BUN: 8 mg/dL (ref 8–23)
CO2: 25 mmol/L (ref 22–32)
Calcium: 7.7 mg/dL — ABNORMAL LOW (ref 8.9–10.3)
Chloride: 106 mmol/L (ref 98–111)
Creatinine, Ser: 0.7 mg/dL (ref 0.61–1.24)
GFR, Estimated: >60 mL/min (ref >60)
Glucose, Bld: 160 mg/dL — ABNORMAL HIGH (ref 70–99)
Potassium: 3.5 mmol/L (ref 3.5–5.1)
Sodium: 137 mmol/L (ref 135–145)

## 2021-10-31 LAB — HEMOGLOBIN AND HEMATOCRIT, BLOOD
HCT: 35.8 % — ABNORMAL LOW (ref 39.0–52.0)
Hemoglobin: 12.1 g/dL — ABNORMAL LOW (ref 13.0–17.0)

## 2021-10-31 LAB — GLUCOSE, CAPILLARY: Glucose-Capillary: 174 mg/dL — ABNORMAL HIGH (ref 70–99)

## 2021-10-31 MED ORDER — CHLORHEXIDINE GLUCONATE CLOTH 2 % EX PADS
6.0000 | MEDICATED_PAD | Freq: Every day | CUTANEOUS | Status: DC
  Administered 2021-10-31: 6 via TOPICAL

## 2021-10-31 MED ORDER — FINASTERIDE 5 MG PO TABS
5.0000 mg | ORAL_TABLET | Freq: Every day | ORAL | Status: DC
  Administered 2021-10-31: 5 mg via ORAL
  Filled 2021-10-31: qty 1

## 2021-10-31 MED ORDER — IRBESARTAN 300 MG PO TABS
300.0000 mg | ORAL_TABLET | Freq: Every day | ORAL | Status: DC
  Administered 2021-10-31: 300 mg via ORAL
  Filled 2021-10-31: qty 1

## 2021-10-31 NOTE — Discharge Summary (Signed)
Physician Discharge Summary  Patient ID: Kenneth Little MRN: [id number] DOB/AGE: 04-08-1945 76 y.o.  Admit date: 10/29/2021 Discharge date: 10/31/2021  Admission Diagnoses:  Benign prostatic hyperplasia  Discharge Diagnoses:  Principal Problem:   Benign prostatic hyperplasia Active Problems:   Hypertension   Type 2 diabetes mellitus without complications ([place])   BPH (benign prostatic hyperplasia)   Past Medical History:  Diagnosis Date   Diabetes mellitus without complication ([place])    Enlarged prostate    Hyperlipidemia    Hypertension    UTI (lower urinary tract infection)     Surgeries: Procedure(s): XI ROBOTIC ASSISTED SIMPLE PROSTATECTOMY XI ROBOTIC ASSISTED BILATERAL INGUINAL HERNIA REPAIR on 10/29/2021   Consultants (if any):   Discharged Condition: Improved  Hospital Course: Kenneth Little is an 76 y.o. male who was admitted 10/29/2021 with a diagnosis of Benign prostatic hyperplasia and went to the operating room on 10/29/2021 and underwent the above named procedures.  He had bloody urine on POD#1 with a decline in the Hgb to 12.1 at D/C.  The urine remained bloody on POD#2 with a few clots.  I irrigated his catheter with return of about 52m of old clots but there was no active bleeding so he was felt to be ready for D/C.  His BP was up so his BP meds were resumed.     He was given perioperative antibiotics:  Anti-infectives (From admission, onward)    Start     Dose/Rate Route Frequency Ordered Stop   10/29/21 0529  ceFAZolin (ANCEF) IVPB 2g/100 mL premix        2 g 200 mL/hr over 30 Minutes Intravenous 30 min pre-op 10/29/21 0529 10/29/21 0725   10/29/21 0000  sulfamethoxazole-trimethoprim (BACTRIM DS) 800-160 MG tablet        1 tablet Oral 2 times daily 10/29/21 1019       .  He was given sequential compression devices and early ambulation, for DVT prophylaxis.  He benefited maximally from the hospital stay and there were no complications.    Recent vital  signs:  Vitals:   10/31/21 0445 10/31/21 0835  BP: (!) 155/71 (!) 183/93  Pulse: 93 (!) 101  Resp: 19 20  Temp: 98.8 F (37.1 C) 99.3 F (37.4 C)  SpO2: 91% 93%    Recent laboratory studies:  Lab Results  Component Value Date   HGB 12.1 (L) 10/31/2021   HGB 13.2 10/30/2021   HGB 15.1 10/29/2021   Lab Results  Component Value Date   WBC 6.0 10/19/2021   PLT 168 10/19/2021   Lab Results  Component Value Date   INR 0.84 12/26/2012   Lab Results  Component Value Date   NA 137 10/31/2021   K 3.5 10/31/2021   CL 106 10/31/2021   CO2 25 10/31/2021   BUN 8 10/31/2021   CREATININE 0.70 10/31/2021   GLUCOSE 160 (H) 10/31/2021    Discharge Medications:   Allergies as of 10/31/2021       Reactions   Ace Inhibitors Cough, Itching   Hydrochlorothiazide Cough, Itching        Medication List     STOP taking these medications    alfuzosin 10 MG 24 hr tablet Commonly known as: UROXATRAL   aspirin EC 81 MG tablet   B-12 PO   Calcium Carbonate-Vitamin D 600-5 MG-MCG Tabs   oxybutynin 5 MG 24 hr tablet Commonly known as: DITROPAN-XL   TURMERIC PO   Vitamin D (Ergocalciferol) 1.25 MG (50000 UNIT) Caps  capsule Commonly known as: DRISDOL       TAKE these medications    atorvastatin 40 MG tablet Commonly known as: LIPITOR Take 1 tablet (40 mg total) by mouth every evening.   docusate sodium 100 MG capsule Commonly known as: COLACE Take 1 capsule (100 mg total) by mouth 2 (two) times daily.   finasteride 5 MG tablet Commonly known as: PROSCAR Take 5 mg by mouth daily.   HYDROcodone-acetaminophen 5-325 MG tablet Commonly known as: Norco Take 1-2 tablets by mouth every 6 (six) hours as needed for moderate pain or severe pain.   insulin glargine 100 UNIT/ML injection Commonly known as: LANTUS Inject 28 Units into the skin daily before supper.   Jardiance 25 MG Tabs tablet Generic drug: empagliflozin Take 25 mg by mouth daily.   metFORMIN 1000 MG  tablet Commonly known as: GLUCOPHAGE Take 1,000 mg by mouth 2 (two) times daily before a meal.   olmesartan 40 MG tablet Commonly known as: BENICAR Take 1 tablet (40 mg total) by mouth daily.   OneTouch Verio test strip Generic drug: glucose blood   pioglitazone 45 MG tablet Commonly known as: ACTOS Take 1 tablet (45 mg total) by mouth every morning. What changed: how much to take   sitaGLIPtin 100 MG tablet Commonly known as: JANUVIA Take 1 tablet (100 mg total) by mouth every evening.   solifenacin 5 MG tablet Commonly known as: VESICARE Take 5 mg by mouth daily.   sulfamethoxazole-trimethoprim 800-160 MG tablet Commonly known as: BACTRIM DS Take 1 tablet by mouth 2 (two) times daily. Start the day prior to foley removal appointment        Diagnostic Studies: No results found.  Disposition: Discharge disposition: 01-Home or Self Care       Discharge Instructions     Discontinue IV   Complete by: As directed         Follow-up Information     Alexis Frock, MD Follow up on 11/08/2021.   Specialty: Urology Why: at 8:45 for MD visit and catheter removal Contact information: [place] [address] [phone]                  Signed: Irine Seal 10/31/2021, 9:33 AM

## 2021-11-01 LAB — SURGICAL PATHOLOGY

## 2021-11-01 LAB — GLUCOSE, CAPILLARY: Glucose-Capillary: 197 mg/dL — ABNORMAL HIGH (ref 70–99)

## 2021-11-09 ENCOUNTER — Encounter: Payer: Self-pay | Admitting: Internal Medicine

## 2022-02-22 ENCOUNTER — Ambulatory Visit: Payer: Medicare Other | Admitting: Internal Medicine

## 2022-02-22 ENCOUNTER — Encounter: Payer: Self-pay | Admitting: Internal Medicine

## 2022-02-22 VITALS — BP 137/74 | HR 87 | Temp 98.1°F | Wt 175.0 lb

## 2022-02-22 DIAGNOSIS — R519 Headache, unspecified: Secondary | ICD-10-CM

## 2022-02-22 NOTE — Progress Notes (Signed)
Established Patient Office Visit     CC/Reason for Visit: Headache  HPI: Kenneth Little is a 77 y.o. male who is coming in today for the above mentioned reasons.  He had a left-sided headache for around 24 hours last week.  It has since resolved.  It felt like a tingly sensation of his skin.  He has not noticed any rashes, he has completed his shingles vaccine.  He has had no recurrence of the headache.  He rated it as a 1-2 out of 10 on the pain scale.  Did not take any over-the-counter pain relievers.   Past Medical/Surgical History: Past Medical History:  Diagnosis Date   Diabetes mellitus without complication ([place])    Enlarged prostate    Hyperlipidemia    Hypertension    UTI (lower urinary tract infection)     Past Surgical History:  Procedure Laterality Date   CHOLECYSTECTOMY N/A 12/24/2012   Procedure: LAPAROSCOPIC CHOLECYSTECTOMY WITH INTRAOPERATIVE CHOLANGIOGRAM;  Surgeon: Shann Medal, MD;  [facility];  Service: General;  Laterality: N/A;   CYSTOSCOPY WITH BIOPSY N/A 03/22/2016   Procedure: CYSTOSCOPY WITH BLADDER  BIOPSY WITH FULGURATION;  Surgeon: Irine Seal, MD;  Location: [place];  Service: Urology;  Laterality: N/A;   diverticulitis     ERCP N/A 12/23/2012   Procedure: ENDOSCOPIC RETROGRADE CHOLANGIOPANCREATOGRAPHY (ERCP);  Surgeon: Jeryl Columbia, MD;  [facility];  Service: Endoscopy;  Laterality: N/A;   HEMORRHOID SURGERY  03/20/12   internal   HERNIA REPAIR Bilateral    childhood   XI ROBOTIC ASSISTED SIMPLE PROSTATECTOMY N/A 10/29/2021   Procedure: XI ROBOTIC ASSISTED SIMPLE PROSTATECTOMY;  Surgeon: Alexis Frock, MD;  [facility];  Service: Urology;  Laterality: N/A;  3 HRS    Social History:  reports that he quit smoking about 33 years ago. His smoking use included cigarettes. He has a 44.00 pack-year smoking history. He has never used smokeless tobacco. He reports that he does not drink alcohol and does not use  drugs.  Allergies: Allergies  Allergen Reactions   Ace Inhibitors Cough and Itching   Hydrochlorothiazide Cough and Itching    Family History:  Family History  Problem Relation Age of Onset   Cancer Mother        breast   Hypertension Father    Diabetes Brother      Current Outpatient Medications:    atorvastatin (LIPITOR) 40 MG tablet, Take 1 tablet (40 mg total) by mouth every evening., Disp: 90 tablet, Rfl: 5   docusate sodium (COLACE) 100 MG capsule, Take 1 capsule (100 mg total) by mouth 2 (two) times daily., Disp: , Rfl:    finasteride (PROSCAR) 5 MG tablet, Take 5 mg by mouth daily., Disp: , Rfl:    HYDROcodone-acetaminophen (NORCO) 5-325 MG tablet, Take 1-2 tablets by mouth every 6 (six) hours as needed for moderate pain or severe pain., Disp: 20 tablet, Rfl: 0   insulin glargine (LANTUS) 100 UNIT/ML injection, Inject 28 Units into the skin daily before supper., Disp: , Rfl:    JARDIANCE 25 MG TABS tablet, Take 25 mg by mouth daily., Disp: , Rfl:    metFORMIN (GLUCOPHAGE) 1000 MG tablet, Take 1,000 mg by mouth 2 (two) times daily before a meal., Disp: , Rfl:    olmesartan (BENICAR) 40 MG tablet, Take 1 tablet (40 mg total) by mouth daily., Disp: 90 tablet, Rfl: 4   ONETOUCH VERIO test strip, , Disp: , Rfl:  pioglitazone (ACTOS) 45 MG tablet, Take 1 tablet (45 mg total) by mouth every morning. (Patient taking differently: Take 22.5 mg by mouth every morning.), Disp: 90 tablet, Rfl: 5   sitaGLIPtin (JANUVIA) 100 MG tablet, Take 1 tablet (100 mg total) by mouth every evening., Disp: 90 tablet, Rfl: 5   solifenacin (VESICARE) 5 MG tablet, Take 5 mg by mouth daily., Disp: , Rfl:    sulfamethoxazole-trimethoprim (BACTRIM DS) 800-160 MG tablet, Take 1 tablet by mouth 2 (two) times daily. Start the day prior to foley removal appointment, Disp: 6 tablet, Rfl: 0  Review of Systems:  Negative unless indicated in HPI.   Physical Exam: Vitals:   02/22/22 1402 02/22/22 1405  BP:  (!) 140/80 137/74  Pulse: 87   Temp: 98.1 F (36.7 C)   TempSrc: Oral   SpO2: 94%   Weight: 175 lb (79.4 kg)     Body mass index is 29.12 kg/m.   Physical Exam Vitals reviewed.  Constitutional:      Appearance: Normal appearance.  HENT:     Head: Normocephalic and atraumatic.  Eyes:     Conjunctiva/sclera: Conjunctivae normal.     Pupils: Pupils are equal, round, and reactive to light.  Cardiovascular:     Rate and Rhythm: Normal rate and regular rhythm.  Pulmonary:     Effort: Pulmonary effort is normal.     Breath sounds: Normal breath sounds.  Skin:    General: Skin is warm and dry.  Neurological:     General: No focal deficit present.     Mental Status: He is alert and oriented to person, place, and time.  Psychiatric:        Mood and Affect: Mood normal.        Behavior: Behavior normal.        Thought Content: Thought content normal.        Judgment: Judgment normal.      Impression and Plan:  Acute nonintractable headache, unspecified headache type  -Observation for now, nothing further indicated at this time.  Time spent:22 minutes reviewing chart, interviewing and examining patient and formulating plan of care.     Lelon Frohlich, MD [facility]

## 2022-03-09 ENCOUNTER — Ambulatory Visit: Payer: Medicare Other | Admitting: Internal Medicine

## 2022-03-09 ENCOUNTER — Encounter: Payer: Self-pay | Admitting: Internal Medicine

## 2022-03-09 VITALS — BP 130/80 | HR 74 | Temp 98.0°F | Wt 177.6 lb

## 2022-03-09 DIAGNOSIS — J302 Other seasonal allergic rhinitis: Secondary | ICD-10-CM | POA: Diagnosis not present

## 2022-03-09 MED ORDER — TACROLIMUS 0.1 % EX OINT: TOPICAL_OINTMENT | Freq: Two times a day (BID) | CUTANEOUS | 0 refills | Status: AC

## 2022-03-09 NOTE — Progress Notes (Signed)
Established Patient Office Visit     CC/Reason for Visit: Redness and itching of face  HPI: Kenneth Little is a 77 y.o. male who is coming in today for the above mentioned reasons.  For the past 2 days he has had intense itching and flakiness of skin mainly around his eyes but also around his face and cheeks.  No new products.   Past Medical/Surgical History: Past Medical History:  Diagnosis Date   Diabetes mellitus without complication ([place])    Enlarged prostate    Hyperlipidemia    Hypertension    UTI (lower urinary tract infection)     Past Surgical History:  Procedure Laterality Date   CHOLECYSTECTOMY N/A 12/24/2012   Procedure: LAPAROSCOPIC CHOLECYSTECTOMY WITH INTRAOPERATIVE CHOLANGIOGRAM;  Surgeon: Shann Medal, MD;  [facility];  Service: General;  Laterality: N/A;   CYSTOSCOPY WITH BIOPSY N/A 03/22/2016   Procedure: CYSTOSCOPY WITH BLADDER  BIOPSY WITH FULGURATION;  Surgeon: Irine Seal, MD;  Location: [place];  Service: Urology;  Laterality: N/A;   diverticulitis     ERCP N/A 12/23/2012   Procedure: ENDOSCOPIC RETROGRADE CHOLANGIOPANCREATOGRAPHY (ERCP);  Surgeon: Jeryl Columbia, MD;  [facility];  Service: Endoscopy;  Laterality: N/A;   HEMORRHOID SURGERY  03/20/12   internal   HERNIA REPAIR Bilateral    childhood   XI ROBOTIC ASSISTED SIMPLE PROSTATECTOMY N/A 10/29/2021   Procedure: XI ROBOTIC ASSISTED SIMPLE PROSTATECTOMY;  Surgeon: Alexis Frock, MD;  [facility];  Service: Urology;  Laterality: N/A;  3 HRS    Social History:  reports that he quit smoking about 33 years ago. His smoking use included cigarettes. He has a 44.00 pack-year smoking history. He has never used smokeless tobacco. He reports that he does not drink alcohol and does not use drugs.  Allergies: Allergies  Allergen Reactions   Ace Inhibitors Cough and Itching   Hydrochlorothiazide Cough and Itching    Family History:  Family History  Problem Relation  Age of Onset   Cancer Mother        breast   Hypertension Father    Diabetes Brother      Current Outpatient Medications:    atorvastatin (LIPITOR) 40 MG tablet, Take 1 tablet (40 mg total) by mouth every evening., Disp: 90 tablet, Rfl: 5   docusate sodium (COLACE) 100 MG capsule, Take 1 capsule (100 mg total) by mouth 2 (two) times daily., Disp: , Rfl:    finasteride (PROSCAR) 5 MG tablet, Take 5 mg by mouth daily., Disp: , Rfl:    insulin glargine (LANTUS) 100 UNIT/ML injection, Inject 28 Units into the skin daily before supper., Disp: , Rfl:    JARDIANCE 25 MG TABS tablet, Take 25 mg by mouth daily., Disp: , Rfl:    metFORMIN (GLUCOPHAGE) 1000 MG tablet, Take 1,000 mg by mouth 2 (two) times daily before a meal., Disp: , Rfl:    olmesartan (BENICAR) 40 MG tablet, Take 1 tablet (40 mg total) by mouth daily., Disp: 90 tablet, Rfl: 4   ONETOUCH VERIO test strip, , Disp: , Rfl:    pioglitazone (ACTOS) 45 MG tablet, Take 1 tablet (45 mg total) by mouth every morning. (Patient taking differently: Take 22.5 mg by mouth every morning.), Disp: 90 tablet, Rfl: 5   sitaGLIPtin (JANUVIA) 100 MG tablet, Take 1 tablet (100 mg total) by mouth every evening., Disp: 90 tablet, Rfl: 5   tacrolimus (PROTOPIC) 0.1 % ointment, Apply topically 2 (two) times  daily., Disp: 100 g, Rfl: 0  Review of Systems:  Negative unless indicated in HPI.   Physical Exam: Vitals:   03/09/22 1321  BP: 130/80  Pulse: 74  Temp: 98 F (36.7 C)  TempSrc: Oral  SpO2: 94%  Weight: 177 lb 9.6 oz (80.6 kg)    Body mass index is 29.55 kg/m.   Physical Exam Vitals reviewed.  Constitutional:      Appearance: Normal appearance.  HENT:     Head: Normocephalic and atraumatic.  Eyes:     Conjunctiva/sclera: Conjunctivae normal.     Pupils: Pupils are equal, round, and reactive to light.  Skin:    Comments: Area around eyes and around mouth and cheeks is erythematous and flaky.  Neurological:     General: No focal  deficit present.     Mental Status: He is alert and oriented to person, place, and time.  Psychiatric:        Mood and Affect: Mood normal.        Behavior: Behavior normal.        Thought Content: Thought content normal.        Judgment: Judgment normal.      Impression and Plan:  Seasonal allergies - Plan: tacrolimus (PROTOPIC) 0.1 % ointment, Ambulatory referral to Allergy  -Have advised referral to allergist as this appears to be some form of allergic reaction. -Have advised daily Zyrtec and will prescribe some tacrolimus ointment for the skin surrounding his eyes.  Time spent:31 minutes reviewing chart, interviewing and examining patient and formulating plan of care.     Lelon Frohlich, MD [facility]

## 2022-03-09 NOTE — Patient Instructions (Signed)
-  Nice seeing you today!!  -Zyrtec 1 tablet daily.  -tacrolimus ointment once to twice daily.  -Referral to an allergist has been placed.

## 2022-11-08 ENCOUNTER — Telehealth: Payer: Self-pay | Admitting: Internal Medicine

## 2022-11-08 ENCOUNTER — Encounter: Payer: Self-pay | Admitting: Internal Medicine

## 2022-11-08 ENCOUNTER — Ambulatory Visit (INDEPENDENT_AMBULATORY_CARE_PROVIDER_SITE_OTHER): Payer: Medicare Other | Admitting: Internal Medicine

## 2022-11-08 ENCOUNTER — Encounter: Payer: Medicare Other | Admitting: Internal Medicine

## 2022-11-08 VITALS — BP 128/78 | HR 70 | Temp 97.4°F | Ht 66.5 in | Wt 176.3 lb

## 2022-11-08 DIAGNOSIS — I10 Essential (primary) hypertension: Secondary | ICD-10-CM | POA: Diagnosis not present

## 2022-11-08 DIAGNOSIS — E7800 Pure hypercholesterolemia, unspecified: Secondary | ICD-10-CM | POA: Diagnosis not present

## 2022-11-08 DIAGNOSIS — Z794 Long term (current) use of insulin: Secondary | ICD-10-CM

## 2022-11-08 DIAGNOSIS — E119 Type 2 diabetes mellitus without complications: Secondary | ICD-10-CM | POA: Diagnosis not present

## 2022-11-08 DIAGNOSIS — R972 Elevated prostate specific antigen [PSA]: Secondary | ICD-10-CM | POA: Diagnosis not present

## 2022-11-08 DIAGNOSIS — N400 Enlarged prostate without lower urinary tract symptoms: Secondary | ICD-10-CM

## 2022-11-08 DIAGNOSIS — Z0000 Encounter for general adult medical examination without abnormal findings: Secondary | ICD-10-CM

## 2022-11-08 LAB — COMPREHENSIVE METABOLIC PANEL
ALT: 28 U/L (ref 0–53)
AST: 21 U/L (ref 0–37)
Albumin: 4 g/dL (ref 3.5–5.2)
Alkaline Phosphatase: 62 U/L (ref 39–117)
BUN: 18 mg/dL (ref 6–23)
CO2: 27 meq/L (ref 19–32)
Calcium: 9.9 mg/dL (ref 8.4–10.5)
Chloride: 103 meq/L (ref 96–112)
Creatinine, Ser: 0.76 mg/dL (ref 0.40–1.50)
GFR: 88.26 mL/min (ref >60.00)
Glucose, Bld: 102 mg/dL — ABNORMAL HIGH (ref 70–99)
Potassium: 3.6 meq/L (ref 3.5–5.1)
Sodium: 138 meq/L (ref 135–145)
Total Bilirubin: 1.1 mg/dL (ref 0.2–1.2)
Total Protein: 6.5 g/dL (ref 6.0–8.3)

## 2022-11-08 LAB — CBC WITH DIFFERENTIAL/PLATELET
Basophils Absolute: 0 10*3/uL (ref 0.0–0.1)
Basophils Relative: 0.4 % (ref 0.0–3.0)
Eosinophils Absolute: 0.2 10*3/uL (ref 0.0–0.7)
Eosinophils Relative: 3.1 % (ref 0.0–5.0)
HCT: 49.5 % (ref 39.0–52.0)
Hemoglobin: 16.1 g/dL (ref 13.0–17.0)
Lymphocytes Relative: 30.7 % (ref 12.0–46.0)
Lymphs Abs: 1.9 10*3/uL (ref 0.7–4.0)
MCHC: 32.6 g/dL (ref 30.0–36.0)
MCV: 95.8 fL (ref 78.0–100.0)
Monocytes Absolute: 0.7 10*3/uL (ref 0.1–1.0)
Monocytes Relative: 10.6 % (ref 3.0–12.0)
Neutro Abs: 3.4 10*3/uL (ref 1.4–7.7)
Neutrophils Relative %: 55.2 % (ref 43.0–77.0)
Platelets: 197 10*3/uL (ref 150.0–400.0)
RBC: 5.17 Mil/uL (ref 4.22–5.81)
RDW: 13.7 % (ref 11.5–15.5)
WBC: 6.2 10*3/uL (ref 4.0–10.5)

## 2022-11-08 LAB — VITAMIN B12: Vitamin B-12: 267 pg/mL (ref 211–911)

## 2022-11-08 LAB — VITAMIN D 25 HYDROXY (VIT D DEFICIENCY, FRACTURES): VITD: 80.95 ng/mL (ref 30.00–100.00)

## 2022-11-08 LAB — MICROALBUMIN / CREATININE URINE RATIO
Creatinine,U: 65 mg/dL
Microalb Creat Ratio: 16.7 mg/g (ref 0.0–30.0)
Microalb, Ur: 10.9 mg/dL — ABNORMAL HIGH (ref 0.0–1.9)

## 2022-11-08 LAB — LIPID PANEL
Cholesterol: 134 mg/dL (ref 0–200)
HDL: 55.5 mg/dL (ref >39.00)
LDL Cholesterol: 64 mg/dL (ref 0–99)
NonHDL: 78.85
Total CHOL/HDL Ratio: 2
Triglycerides: 75 mg/dL (ref 0.0–149.0)
VLDL: 15 mg/dL (ref 0.0–40.0)

## 2022-11-08 LAB — HEMOGLOBIN A1C: Hgb A1c MFr Bld: 8.5 % — ABNORMAL HIGH (ref 4.6–6.5)

## 2022-11-08 LAB — TSH: TSH: 2.95 u[IU]/mL (ref 0.35–5.50)

## 2022-11-08 LAB — PSA: PSA: 0.34 ng/mL (ref 0.10–4.00)

## 2022-11-08 NOTE — Progress Notes (Signed)
Established Patient Office Visit     CC/Reason for Visit: Annual preventive exam and subsequent Medicare wellness visit  HPI: Kenneth Little is a 77 y.o. male who is coming in today for the above mentioned reasons. Past Medical History is significant for: Hypertension, hyperlipidemia, type 2 diabetes, BPH status post prostatectomy.  Has routine eye and dental care.  Feeling well without acute concerns or complaints.  He follows with an endocrinologist.  Saw them in September and his A1c was elevated to 9.1.  He admits to some dietary indiscretions but has been working on it since then.   Past Medical/Surgical History: Past Medical History:  Diagnosis Date   Diabetes mellitus without complication (HCC)    Enlarged prostate    Hyperlipidemia    Hypertension    UTI (lower urinary tract infection)     Past Surgical History:  Procedure Laterality Date   CHOLECYSTECTOMY N/A 12/24/2012   Procedure: LAPAROSCOPIC CHOLECYSTECTOMY WITH INTRAOPERATIVE CHOLANGIOGRAM;  Surgeon: Kandis Cocking, MD;  [facility];  Service: General;  Laterality: N/A;   CYSTOSCOPY WITH BIOPSY N/A 03/22/2016   Procedure: CYSTOSCOPY WITH BLADDER  BIOPSY WITH FULGURATION;  Surgeon: Bjorn Pippin, MD;  Location: [facility] [place];  Service: Urology;  Laterality: N/A;   diverticulitis     ERCP N/A 12/23/2012   Procedure: ENDOSCOPIC RETROGRADE CHOLANGIOPANCREATOGRAPHY (ERCP);  Surgeon: Petra Kuba, MD;  [facility];  Service: Endoscopy;  Laterality: N/A;   HEMORRHOID SURGERY  03/20/12   internal   HERNIA REPAIR Bilateral    childhood   XI ROBOTIC ASSISTED SIMPLE PROSTATECTOMY N/A 10/29/2021   Procedure: XI ROBOTIC ASSISTED SIMPLE PROSTATECTOMY;  Surgeon: Sebastian Ache, MD;  [facility];  Service: Urology;  Laterality: N/A;  3 HRS    Social History:  reports that he quit smoking about 33 years ago. His smoking use included cigarettes. He started smoking about 56 years ago. He has a 44 pack-year  smoking history. He has never used smokeless tobacco. He reports that he does not drink alcohol and does not use drugs.  Allergies: Allergies  Allergen Reactions   Ace Inhibitors Cough and Itching   Hydrochlorothiazide Cough and Itching    Family History:  Family History  Problem Relation Age of Onset   Cancer Mother        breast   Hypertension Father    Diabetes Brother      Current Outpatient Medications:    aspirin EC 81 MG tablet, Take 81 mg by mouth daily. Swallow whole., Disp: , Rfl:    atorvastatin (LIPITOR) 40 MG tablet, Take 1 tablet (40 mg total) by mouth every evening., Disp: 90 tablet, Rfl: 5   calcium carbonate (OSCAL) 1500 (600 Ca) MG TABS tablet, Take by mouth 2 (two) times daily with a meal., Disp: , Rfl:    Cholecalciferol (D3 5000 PO), Take by mouth., Disp: , Rfl:    finasteride (PROSCAR) 5 MG tablet, Take 5 mg by mouth daily., Disp: , Rfl:    insulin glargine (LANTUS) 100 UNIT/ML injection, Inject 28 Units into the skin daily before supper., Disp: , Rfl:    JARDIANCE 25 MG TABS tablet, Take 25 mg by mouth daily., Disp: , Rfl:    metFORMIN (GLUCOPHAGE) 1000 MG tablet, Take 1,000 mg by mouth 2 (two) times daily before a meal., Disp: , Rfl:    olmesartan (BENICAR) 40 MG tablet, Take 1 tablet (40 mg total) by mouth daily., Disp: 90 tablet, Rfl: 4  ONETOUCH VERIO test strip, , Disp: , Rfl:    pioglitazone (ACTOS) 45 MG tablet, Take 1 tablet (45 mg total) by mouth every morning. (Patient taking differently: Take 22.5 mg by mouth every morning.), Disp: 90 tablet, Rfl: 5   sitaGLIPtin (JANUVIA) 100 MG tablet, Take 1 tablet (100 mg total) by mouth every evening., Disp: 90 tablet, Rfl: 5   tacrolimus (PROTOPIC) 0.1 % ointment, Apply topically 2 (two) times daily., Disp: 100 g, Rfl: 0   Turmeric (QC TUMERIC COMPLEX PO), Take by mouth., Disp: , Rfl:   Review of Systems:  Negative unless indicated in HPI.   Physical Exam: Vitals:   11/08/22 0805 11/08/22 0809  BP:  130/80 128/78  Pulse: 70   Temp: (!) 97.4 F (36.3 C)   TempSrc: Oral   SpO2: 97%   Weight: 176 lb 4.8 oz (80 kg)   Height: 5' 6.5" (1.689 m)     Body mass index is 28.03 kg/m.   Physical Exam Vitals reviewed.  Constitutional:      General: He is not in acute distress.    Appearance: Normal appearance. He is not ill-appearing, toxic-appearing or diaphoretic.  HENT:     Head: Normocephalic.     Right Ear: Tympanic membrane, ear canal and external ear normal. There is no impacted cerumen.     Left Ear: Tympanic membrane, ear canal and external ear normal. There is no impacted cerumen.     Nose: Nose normal.     Mouth/Throat:     Mouth: Mucous membranes are moist.     Pharynx: Oropharynx is clear. No oropharyngeal exudate or posterior oropharyngeal erythema.  Eyes:     General: No scleral icterus.       Right eye: No discharge.        Left eye: No discharge.     Conjunctiva/sclera: Conjunctivae normal.     Pupils: Pupils are equal, round, and reactive to light.  Neck:     Vascular: No carotid bruit.  Cardiovascular:     Rate and Rhythm: Normal rate and regular rhythm.     Pulses: Normal pulses.     Heart sounds: Normal heart sounds.  Pulmonary:     Effort: Pulmonary effort is normal. No respiratory distress.     Breath sounds: Normal breath sounds.  Abdominal:     General: Abdomen is flat. Bowel sounds are normal.     Palpations: Abdomen is soft.  Musculoskeletal:        General: Normal range of motion.     Cervical back: Normal range of motion.  Skin:    General: Skin is warm and dry.  Neurological:     General: No focal deficit present.     Mental Status: He is alert and oriented to person, place, and time. Mental status is at baseline.  Psychiatric:        Mood and Affect: Mood normal.        Behavior: Behavior normal.        Thought Content: Thought content normal.        Judgment: Judgment normal.    Subsequent Medicare wellness visit   1. Risk factors,  based on past  M,S,F - Cardiac Risk Factors include: advanced age (>14men, >63 women);diabetes mellitus;male gender   2.  Physical activities: Dietary issues and exercise activities discussed:      3.  Depression/mood:  Garment/textile technologist Visit from 11/08/2022 in [facility] Total Score 0  4.  ADL's:    11/07/2022    4:55 PM  In your present state of health, do you have any difficulty performing the following activities:  Hearing? 0  Vision? 1  Difficulty concentrating or making decisions? 0  Walking or climbing stairs? 0  Dressing or bathing? 0  Doing errands, shopping? 0  Preparing Food and eating ? N  Using the Toilet? N  In the past six months, have you accidently leaked urine? N  Do you have problems with loss of bowel control? N  Managing your Medications? N  Managing your Finances? N  Housekeeping or managing your Housekeeping? N     5.  Fall risk:     10/30/2021    7:47 AM 10/30/2021   11:00 PM 10/31/2021    8:35 AM 02/22/2022    2:33 PM 11/08/2022    8:03 AM  Fall Risk  Falls in the past year?    0 0  Was there an injury with Fall?    0 0  Fall Risk Category Calculator    0 0  (RETIRED) Patient Fall Risk Level Moderate fall risk Moderate fall risk Moderate fall risk    Patient at Risk for Falls Due to    No Fall Risks   Fall risk Follow up    Falls evaluation completed Falls evaluation completed     6.  Home safety: No problems identified   7.  Height weight, and visual acuity: height and weight as above, vision/hearing: Vision Screening   Right eye Left eye Both eyes  Without correction 20/30 20/30 20/30   With correction        8.  Counseling: Counseling given: Not Answered Tobacco comments: 2 packs winstons    9. Lab orders based on risk factors: Laboratory update will be reviewed   10. Cognitive assessment:    10/31/2017    3:37 PM 10/27/2016    4:31 PM  MMSE - Mini Mental State Exam   Not completed: -- --        11/07/2022    4:59 PM  6CIT Screen  What Year? 0 points  What month? 0 points  What time? 0 points  Count back from 20 0 points  Months in reverse 0 points  Repeat phrase 0 points  Total Score 0 points     11. Screening: Patient provided with a written and personalized 5-10 year screening schedule in the AVS. Health Maintenance  Topic Date Due   Eye exam for diabetics  09/10/2021   Hemoglobin A1C  03/29/2022   Yearly kidney health urinalysis for diabetes  09/29/2022   Yearly kidney function blood test for diabetes  11/01/2022   COVID-19 Vaccine (8 - 2023-24 season) 12/15/2022   Colon Cancer Screening  04/04/2023   Complete foot exam   11/08/2023   Medicare Annual Wellness Visit  11/08/2023   DTaP/Tdap/Td vaccine (2 - Td or Tdap) 09/30/2030   Pneumonia Vaccine  Completed   Flu Shot  Completed   Hepatitis C Screening  Completed   Zoster (Shingles) Vaccine  Completed   HPV Vaccine  Aged Out    12. Provider List Update: Patient Care Team    Relationship Specialty Notifications Start End  Philip Aspen, Limmie Patricia, MD PCP - General Internal Medicine  01/10/19   Charna Elizabeth, MD Consulting Physician Gastroenterology  12/24/12   Vida Rigger, MD Consulting Physician Gastroenterology  12/24/12   Lupita Leash, MD (Inactive) Consulting Physician Pulmonary Disease  12/24/12  13. Advance Directives: Does Patient Have a Medical Advance Directive?: Yes Type of Advance Directive: Healthcare Power of Attorney, Living will, Out of facility DNR (pink MOST or yellow form) Does patient want to make changes to medical advance directive?: No - Patient declined Copy of Healthcare Power of Attorney in Chart?: No - copy requested  14. Opioids: Patient is not on any opioid prescriptions and has no risk factors for a substance use disorder.   15.   Goals      patient     Just maintain your health Continue to take care of yourself       Patient  Stated     Take care of wife       Protect My Health     Timeframe:  Long-Range Goal Priority:  Medium Start Date:                             Expected End Date:                       Follow Up Date 11/07/2023    - schedule and keep appointment for annual check-up    Why is this important?   Screening tests can find diseases early when they are easier to treat.  Your doctor or nurse will talk with you about which tests are important for you.  Getting shots for common diseases like the flu and shingles will help prevent them.     Notes:          I have personally reviewed and noted the following in the patient's chart:   Medical and social history Use of alcohol, tobacco or illicit drugs  Current medications and supplements Functional ability and status Nutritional status Physical activity Advanced directives List of other physicians Hospitalizations, surgeries, and ER visits in previous 12 months Vitals Screenings to include cognitive, depression, and falls Referrals and appointments  In addition, I have reviewed and discussed with patient certain preventive protocols, quality metrics, and best practice recommendations. A written personalized care plan for preventive services as well as general preventive health recommendations were provided to patient.   Impression and Plan:  Medicare annual wellness visit, subsequent  Type 2 diabetes mellitus without complication, with long-term current use of insulin (HCC) -     Comprehensive metabolic panel; Future -     CBC with Differential/Platelet; Future -     Vitamin B12; Future -     TSH; Future -     VITAMIN D 25 Hydroxy (Vit-D Deficiency, Fractures); Future -     Hemoglobin A1c; Future -     Microalbumin / creatinine urine ratio  Primary hypertension  Hypercholesterolemia -     Lipid panel; Future  BPH with elevated PSA -     PSA; Future   -Recommend routine eye and dental care. -Healthy lifestyle  discussed in detail. -Labs to be updated today. -Prostate cancer screening: PSA today Health Maintenance  Topic Date Due   Eye exam for diabetics  09/10/2021   Hemoglobin A1C  03/29/2022   Yearly kidney health urinalysis for diabetes  09/29/2022   Yearly kidney function blood test for diabetes  11/01/2022   COVID-19 Vaccine (8 - 2023-24 season) 12/15/2022   Colon Cancer Screening  04/04/2023   Complete foot exam   11/08/2023   Medicare Annual Wellness Visit  11/08/2023   DTaP/Tdap/Td vaccine (2 - Td or Tdap) 09/30/2030  Pneumonia Vaccine  Completed   Flu Shot  Completed   Hepatitis C Screening  Completed   Zoster (Shingles) Vaccine  Completed   HPV Vaccine  Aged Out     -All immunizations are up-to-date. -Will be due for screening colonoscopy in March 2025 with Dr. Loreta Ave.     Chaya Jan, MD [place] at [facility]

## 2022-11-08 NOTE — Progress Notes (Signed)
Medicare Wellness question were answered 11/07/2022

## 2022-11-08 NOTE — Telephone Encounter (Signed)
Requesting copy of after visit summary

## 2022-11-08 NOTE — Telephone Encounter (Signed)
AVS available for pick up and the patient is aware.

## 2023-12-13 NOTE — Progress Notes (Signed)
 Kenneth Little                                          MRN: [id number]   12/13/2023   The [facility] Quality Team Specialist reviewed this patient medical record for the purposes of chart review for care gap closure. The following were reviewed: chart review for care gap closure-kidney health evaluation for diabetes:eGFR  and uACR.    [facility] Quality Team
